# Patient Record
Sex: Male | Born: 1955 | Race: Black or African American | Hispanic: No | Smoking: Never smoker
Health system: Southern US, Community
[De-identification: ages and names within clinical notes are randomized; demographics above are authoritative.]

## PROBLEM LIST (undated history)

## (undated) DIAGNOSIS — K219 Gastro-esophageal reflux disease without esophagitis: Secondary | ICD-10-CM

## (undated) DIAGNOSIS — C801 Malignant (primary) neoplasm, unspecified: Secondary | ICD-10-CM

## (undated) DIAGNOSIS — I10 Essential (primary) hypertension: Secondary | ICD-10-CM

## (undated) HISTORY — PX: PROSTATE SURGERY: SHX751

---

## 1998-07-25 ENCOUNTER — Encounter: Payer: Self-pay | Admitting: *Deleted

## 1998-07-25 ENCOUNTER — Ambulatory Visit (HOSPITAL_COMMUNITY): Admission: RE | Admit: 1998-07-25 | Discharge: 1998-07-25 | Payer: Self-pay

## 1999-07-23 ENCOUNTER — Emergency Department (HOSPITAL_COMMUNITY): Admission: EM | Admit: 1999-07-23 | Discharge: 1999-07-23 | Payer: Self-pay | Admitting: Emergency Medicine

## 2000-06-28 ENCOUNTER — Emergency Department (HOSPITAL_COMMUNITY): Admission: EM | Admit: 2000-06-28 | Discharge: 2000-06-28 | Payer: Self-pay

## 2000-06-29 ENCOUNTER — Emergency Department (HOSPITAL_COMMUNITY): Admission: EM | Admit: 2000-06-29 | Discharge: 2000-06-29 | Payer: Self-pay | Admitting: Emergency Medicine

## 2001-03-18 ENCOUNTER — Emergency Department (HOSPITAL_COMMUNITY): Admission: EM | Admit: 2001-03-18 | Discharge: 2001-03-19 | Payer: Self-pay | Admitting: Emergency Medicine

## 2001-03-31 ENCOUNTER — Ambulatory Visit (HOSPITAL_COMMUNITY): Admission: RE | Admit: 2001-03-31 | Discharge: 2001-03-31 | Payer: Self-pay | Admitting: Gastroenterology

## 2001-05-01 ENCOUNTER — Encounter: Admission: RE | Admit: 2001-05-01 | Discharge: 2001-05-01 | Payer: Self-pay | Admitting: Internal Medicine

## 2001-05-01 ENCOUNTER — Encounter: Payer: Self-pay | Admitting: Internal Medicine

## 2001-05-15 ENCOUNTER — Ambulatory Visit (HOSPITAL_COMMUNITY): Admission: RE | Admit: 2001-05-15 | Discharge: 2001-05-15 | Payer: Self-pay | Admitting: Internal Medicine

## 2001-07-15 HISTORY — PX: ROTATOR CUFF REPAIR: SHX139

## 2001-09-18 ENCOUNTER — Encounter: Payer: Self-pay | Admitting: Orthopedic Surgery

## 2001-09-23 ENCOUNTER — Inpatient Hospital Stay (HOSPITAL_COMMUNITY): Admission: RE | Admit: 2001-09-23 | Discharge: 2001-09-24 | Payer: Self-pay | Admitting: Orthopedic Surgery

## 2001-09-23 ENCOUNTER — Encounter: Payer: Self-pay | Admitting: Orthopedic Surgery

## 2001-11-10 ENCOUNTER — Encounter: Payer: Self-pay | Admitting: Orthopaedic Surgery

## 2001-11-10 ENCOUNTER — Encounter: Admission: RE | Admit: 2001-11-10 | Discharge: 2001-11-10 | Payer: Self-pay | Admitting: Orthopaedic Surgery

## 2001-11-12 HISTORY — PX: NECK SURGERY: SHX720

## 2001-12-03 ENCOUNTER — Inpatient Hospital Stay (HOSPITAL_COMMUNITY): Admission: RE | Admit: 2001-12-03 | Discharge: 2001-12-05 | Payer: Self-pay | Admitting: Orthopaedic Surgery

## 2001-12-03 ENCOUNTER — Encounter: Payer: Self-pay | Admitting: Orthopaedic Surgery

## 2002-10-16 ENCOUNTER — Ambulatory Visit (HOSPITAL_COMMUNITY): Admission: RE | Admit: 2002-10-16 | Discharge: 2002-10-16 | Payer: Self-pay | Admitting: Internal Medicine

## 2002-10-16 ENCOUNTER — Encounter: Payer: Self-pay | Admitting: Internal Medicine

## 2004-07-19 ENCOUNTER — Ambulatory Visit: Payer: Self-pay | Admitting: Internal Medicine

## 2004-11-08 ENCOUNTER — Ambulatory Visit: Payer: Self-pay | Admitting: Internal Medicine

## 2005-04-15 ENCOUNTER — Inpatient Hospital Stay (HOSPITAL_COMMUNITY): Admission: EM | Admit: 2005-04-15 | Discharge: 2005-04-17 | Payer: Self-pay | Admitting: Emergency Medicine

## 2005-04-15 ENCOUNTER — Ambulatory Visit: Payer: Self-pay | Admitting: Internal Medicine

## 2005-04-16 ENCOUNTER — Encounter (INDEPENDENT_AMBULATORY_CARE_PROVIDER_SITE_OTHER): Payer: Self-pay | Admitting: Cardiology

## 2005-04-25 ENCOUNTER — Ambulatory Visit: Payer: Self-pay | Admitting: Internal Medicine

## 2005-09-19 ENCOUNTER — Ambulatory Visit: Payer: Self-pay | Admitting: Internal Medicine

## 2006-02-06 ENCOUNTER — Ambulatory Visit: Payer: Self-pay | Admitting: Hospitalist

## 2006-03-11 ENCOUNTER — Ambulatory Visit: Payer: Self-pay | Admitting: Hospitalist

## 2006-03-29 ENCOUNTER — Emergency Department (HOSPITAL_COMMUNITY): Admission: EM | Admit: 2006-03-29 | Discharge: 2006-03-29 | Payer: Self-pay | Admitting: Emergency Medicine

## 2006-07-01 DIAGNOSIS — Z8719 Personal history of other diseases of the digestive system: Secondary | ICD-10-CM | POA: Insufficient documentation

## 2006-07-01 DIAGNOSIS — E785 Hyperlipidemia, unspecified: Secondary | ICD-10-CM | POA: Insufficient documentation

## 2006-07-01 DIAGNOSIS — B350 Tinea barbae and tinea capitis: Secondary | ICD-10-CM | POA: Insufficient documentation

## 2006-07-01 DIAGNOSIS — M5030 Other cervical disc degeneration, unspecified cervical region: Secondary | ICD-10-CM | POA: Insufficient documentation

## 2006-07-01 DIAGNOSIS — N410 Acute prostatitis: Secondary | ICD-10-CM

## 2006-07-01 DIAGNOSIS — I10 Essential (primary) hypertension: Secondary | ICD-10-CM

## 2006-07-01 DIAGNOSIS — K219 Gastro-esophageal reflux disease without esophagitis: Secondary | ICD-10-CM | POA: Insufficient documentation

## 2006-10-29 ENCOUNTER — Emergency Department (HOSPITAL_COMMUNITY): Admission: EM | Admit: 2006-10-29 | Discharge: 2006-10-29 | Payer: Self-pay | Admitting: Family Medicine

## 2007-11-12 ENCOUNTER — Emergency Department (HOSPITAL_COMMUNITY): Admission: EM | Admit: 2007-11-12 | Discharge: 2007-11-12 | Payer: Self-pay | Admitting: Family Medicine

## 2008-06-16 ENCOUNTER — Emergency Department (HOSPITAL_COMMUNITY): Admission: EM | Admit: 2008-06-16 | Discharge: 2008-06-16 | Payer: Self-pay | Admitting: *Deleted

## 2009-02-19 ENCOUNTER — Emergency Department (HOSPITAL_COMMUNITY): Admission: EM | Admit: 2009-02-19 | Discharge: 2009-02-19 | Payer: Self-pay | Admitting: Emergency Medicine

## 2009-04-02 ENCOUNTER — Emergency Department (HOSPITAL_COMMUNITY): Admission: EM | Admit: 2009-04-02 | Discharge: 2009-04-02 | Payer: Self-pay | Admitting: Emergency Medicine

## 2009-12-25 ENCOUNTER — Emergency Department (HOSPITAL_COMMUNITY): Admission: EM | Admit: 2009-12-25 | Discharge: 2009-12-25 | Payer: Self-pay | Admitting: Emergency Medicine

## 2009-12-25 ENCOUNTER — Inpatient Hospital Stay (HOSPITAL_COMMUNITY): Admission: EM | Admit: 2009-12-25 | Discharge: 2009-12-27 | Payer: Self-pay | Admitting: Emergency Medicine

## 2009-12-27 ENCOUNTER — Encounter (INDEPENDENT_AMBULATORY_CARE_PROVIDER_SITE_OTHER): Payer: Self-pay | Admitting: Cardiovascular Disease

## 2010-08-04 ENCOUNTER — Encounter: Payer: Self-pay | Admitting: Gastroenterology

## 2010-09-30 LAB — CBC
HCT: 46.4 % (ref 39.0–52.0)
Hemoglobin: 16.2 g/dL (ref 13.0–17.0)
MCHC: 34.9 g/dL (ref 30.0–36.0)
MCV: 98.2 fL (ref 78.0–100.0)
Platelets: 150 10*3/uL (ref 150–400)
RBC: 4.73 MIL/uL (ref 4.22–5.81)
RDW: 12.8 % (ref 11.5–15.5)
WBC: 3.1 10*3/uL — ABNORMAL LOW (ref 4.0–10.5)

## 2010-09-30 LAB — BASIC METABOLIC PANEL
BUN: 8 mg/dL (ref 6–23)
CO2: 26 mEq/L (ref 19–32)
Calcium: 8.9 mg/dL (ref 8.4–10.5)
Chloride: 109 mEq/L (ref 96–112)
Creatinine, Ser: 0.99 mg/dL (ref 0.4–1.5)
GFR calc Af Amer: >60 mL/min (ref >60)
GFR calc non Af Amer: >60 mL/min (ref >60)
Glucose, Bld: 100 mg/dL — ABNORMAL HIGH (ref 70–99)
Potassium: 3.9 mEq/L (ref 3.5–5.1)
Sodium: 139 mEq/L (ref 135–145)

## 2010-09-30 LAB — HEPARIN LEVEL (UNFRACTIONATED): Heparin Unfractionated: <0.1 IU/mL — ABNORMAL LOW (ref 0.30–0.70)

## 2010-10-01 LAB — CBC
HCT: 44.1 % (ref 39.0–52.0)
HCT: 44.2 % (ref 39.0–52.0)
HCT: 49.3 % (ref 39.0–52.0)
Hemoglobin: 15.2 g/dL (ref 13.0–17.0)
Hemoglobin: 15.3 g/dL (ref 13.0–17.0)
Hemoglobin: 17.1 g/dL — ABNORMAL HIGH (ref 13.0–17.0)
MCHC: 34.7 g/dL (ref 30.0–36.0)
MCHC: 34.8 g/dL (ref 30.0–36.0)
MCV: 98.4 fL (ref 78.0–100.0)
MCV: 98.6 fL (ref 78.0–100.0)
Platelets: 153 10*3/uL (ref 150–400)
Platelets: 157 10*3/uL (ref 150–400)
RBC: 4.47 MIL/uL (ref 4.22–5.81)
RBC: 4.49 MIL/uL (ref 4.22–5.81)
RBC: 5 MIL/uL (ref 4.22–5.81)
RDW: 13.1 % (ref 11.5–15.5)
RDW: 13.3 % (ref 11.5–15.5)
WBC: 2.9 10*3/uL — ABNORMAL LOW (ref 4.0–10.5)
WBC: 3.4 10*3/uL — ABNORMAL LOW (ref 4.0–10.5)

## 2010-10-01 LAB — CK TOTAL AND CKMB (NOT AT ARMC)
CK, MB: 1.6 ng/mL (ref 0.3–4.0)
Relative Index: 1.3 (ref 0.0–2.5)
Total CK: 121 U/L (ref 7–232)

## 2010-10-01 LAB — HEPATIC FUNCTION PANEL
ALT: 16 U/L (ref 0–53)
AST: 28 U/L (ref 0–37)
Albumin: 3.6 g/dL (ref 3.5–5.2)
Alkaline Phosphatase: 53 U/L (ref 39–117)
Bilirubin, Direct: 0.2 mg/dL (ref 0.0–0.3)
Indirect Bilirubin: 0.6 mg/dL (ref 0.3–0.9)
Total Bilirubin: 0.8 mg/dL (ref 0.3–1.2)
Total Protein: 6.5 g/dL (ref 6.0–8.3)

## 2010-10-01 LAB — POCT CARDIAC MARKERS
CKMB, poc: <1 ng/mL — ABNORMAL LOW (ref 1.0–8.0)
Myoglobin, poc: 98.2 ng/mL (ref 12–200)
Troponin i, poc: <0.05 ng/mL (ref 0.00–0.09)

## 2010-10-01 LAB — LIPID PANEL
Cholesterol: 169 mg/dL (ref 0–200)
HDL: 53 mg/dL (ref >39)
LDL Cholesterol: 106 mg/dL — ABNORMAL HIGH (ref 0–99)
Total CHOL/HDL Ratio: 3.2 RATIO

## 2010-10-01 LAB — BASIC METABOLIC PANEL
BUN: 10 mg/dL (ref 6–23)
CO2: 29 mEq/L (ref 19–32)
Calcium: 9.2 mg/dL (ref 8.4–10.5)
Chloride: 110 mEq/L (ref 96–112)
Creatinine, Ser: 1.03 mg/dL (ref 0.4–1.5)
GFR calc Af Amer: >60 mL/min (ref >60)
GFR calc non Af Amer: >60 mL/min (ref >60)
Glucose, Bld: 94 mg/dL (ref 70–99)
Potassium: 4 mEq/L (ref 3.5–5.1)
Sodium: 144 mEq/L (ref 135–145)

## 2010-10-01 LAB — DIFFERENTIAL
Basophils Absolute: 0 10*3/uL (ref 0.0–0.1)
Basophils Relative: 1 % (ref 0–1)
Eosinophils Absolute: 0 10*3/uL (ref 0.0–0.7)
Eosinophils Relative: 1 % (ref 0–5)
Lymphocytes Relative: 31 % (ref 12–46)
Lymphs Abs: 0.9 10*3/uL (ref 0.7–4.0)
Monocytes Absolute: 0.3 10*3/uL (ref 0.1–1.0)
Monocytes Relative: 12 % (ref 3–12)
Neutro Abs: 1.6 10*3/uL — ABNORMAL LOW (ref 1.7–7.7)
Neutrophils Relative %: 56 % (ref 43–77)

## 2010-10-01 LAB — TROPONIN I: Troponin I: 0.01 ng/mL (ref 0.00–0.06)

## 2010-10-01 LAB — HEPARIN LEVEL (UNFRACTIONATED): Heparin Unfractionated: 0.64 IU/mL (ref 0.30–0.70)

## 2010-10-01 LAB — PROTIME-INR
INR: 1.04 (ref 0.00–1.49)
Prothrombin Time: 13.5 seconds (ref 11.6–15.2)

## 2010-10-01 LAB — CARDIAC PANEL(CRET KIN+CKTOT+MB+TROPI)
CK, MB: 1.3 ng/mL (ref 0.3–4.0)
Relative Index: 1.2 (ref 0.0–2.5)
Total CK: 105 U/L (ref 7–232)
Troponin I: <0.01 ng/mL (ref 0.00–0.06)

## 2010-10-01 LAB — MRSA PCR SCREENING: MRSA by PCR: NEGATIVE

## 2010-10-01 LAB — APTT: aPTT: 156 seconds — ABNORMAL HIGH (ref 24–37)

## 2010-10-01 LAB — TSH: TSH: 2.451 u[IU]/mL (ref 0.350–4.500)

## 2010-11-30 NOTE — Op Note (Signed)
[facility]  Patient:    Jeremy Hayden, Jeremy Hayden Visit Number: [id number] MRN: [id number]          Service Type: DSU Location: DAY Attending Physician:  Skip Mayer Dictated by:   Georges Lynch Darrelyn Hillock, M.D. Proc. Date: 09/22/01 Admit Date:  09/22/2001                             Operative Report  PREOPERATIVE DIAGNOSES:  1. Rotator cuff tendon tear with severe impingement syndrome of the right     shoulder.  2. Herniated cervical disk on the right at the C4-5 level.  POSTOPERATIVE DIAGNOSES:  1. Rotator cuff tendon tear with severe impingement syndrome of the right     shoulder.  2. Herniated cervical disk on the right at the C4-5 level.  OPERATION PERFORMED:  1. Partial acromionectomy and acromioplasty on the right.  2. Repair of a tear of the rotator cuff tendon on the right utilizing a     transverse suture, no multitack sutures were used.  DESCRIPTION OF PROCEDURE:  Under general anesthesia, the patient first had an interscalene block. After sterile prep and draping was carried out, an incision was made over the anterior aspect of the right shoulder. At this time, bleeders identified and cauterized. I then separated the deltoid tendon from the acromion in the usual fashion. I then went down and excised the subdeltoid bursa and noted that he had a severe impingement syndrome. Literally he had a hole in his rotator cuff tendon. I then protected the tendon with the Bennett retractor and utilized the oscillating saw and did a partial acromionectomy and then utilized the bur to even out the osteotomy site. I thoroughly irrigated out the area and then identified the hole in the tendon and repaired that with a #1 Ethibond suture. Following that, I then thoroughly irrigated out the area and reapproximated the deltoid tendon and muscle in the usual fashion. The subcu was closed with #0 Vicryl and the skin was closed in the usual fashion. A sterile  Neosporin dressing was applied. The patient then was placed in a shoulder immobilizer. Dictated by:   Georges Lynch Darrelyn Hillock, M.D. Attending Physician:  Skip Mayer DD:  09/22/01 TD:  09/23/01 Job: [id number]

## 2010-11-30 NOTE — Discharge Summary (Signed)
Jeremy Hayden, Jeremy Hayden               ACCOUNT NO.:  [id number]   MEDICAL RECORD NO.:  [id number]          PATIENT TYPE:  INP   LOCATION:  2003                         FACILITY:  [facility]   PHYSICIAN:  Duncan Dull, M.D.     DATE OF BIRTH:  Jul 14, 1956   DATE OF ADMISSION:  04/15/2005  DATE OF DISCHARGE:  04/17/2005                                 DISCHARGE SUMMARY   DISCHARGE DIAGNOSES:  1.  Noncardiac chest pain.  2.  Hypertension.  3.  Gastroesophageal reflux disease.  4.  Dyslipidemia.  5.  Scarring alopecia.  6.  History of C2-C3, C3-C4 disk herniation.  7.  History of rotator cuff repair.  8.  History of irregular heartbeat.  9.  Elevated transaminases.   DISCHARGE MEDICATIONS:  1.  Aspirin 81 mg one tab p.o. daily.  2.  Hydrochlorothiazide 25 mg one tab p.o. daily.  3.  Protonix 40 mg one tab p.o. b.i.d.  4.  Metoprolol 50 mg one tab p.o. b.i.d.  5.  Lisinopril 20 mg one tablet p.o. daily.  6.  Gemfibrozil 600 mg one tab p.o. b.i.d.   CONDITION AT DISCHARGE:  Improved.   FOLLOWUP PLANS:  1.  The patient will follow up to Dr. Orma Flaming on October 12 at 2:30 to follow      up on his chest pain.  2.  The patient can follow up with Dr. Domingo Sep at [facility]      as needed for chest pain.  3.  At the time of hospital followup, we will check his potassium, as well      as his blood pressure.   PROCEDURES:  1.  A CT angiogram of the chest on April 15, 2005 shows a normal      examination.  No evidence of pulmonary emboli.  2.  A portable chest x-ray done on April 15, 2005 shows poor inspiration,      but no active disease.  3.  A 2-D echocardiogram done on April 16, 2005 shows normal left      ventricular systolic function with an ejection fraction of 55-65%.  No      regional wall motion abnormalities.  4.  A Cardiolite done on April 17, 2005 shows normal myocardial perfusion      and a normal left ventricular systolic function with no evidence of  regional wall motion abnormality.   CONSULTATIONS:  Dr. Domingo Sep of [facility].   BRIEF ADMITTING HISTORY AND PHYSICAL:  [name] is a 55 year old African-  American male with a past medical history of hypertension and dyslipidemia,  who presents with new onset of chest pain for the past seven days.  The  symptoms began approximately a week ago with complaints of chest pain  radiating to the chest which he attributed to walking too much.  He took  Maalox with no relief.  His symptoms persisted until yesterday when he awoke  with increasing chest pain and shortness of breath unrelieved with Advil and  Maalox.  The chest pain again worsened on the day of admission with shooting  high voltage chest  pain for which he sought medical treatment at the  outpatient clinic and was referred to the emergency room for further  evaluation by his primary care Shaili Donalson.  He additionally notes the presence  of PND, denies nausea or vomiting, edema, or syncope.  He was seen several  years ago by [facility] for an irregular heartbeat and was told to  take aspirin.  He frequently takes Advil for pain.  In the ED, his symptoms  were unrelieved by nitroglycerin drip.  He did receive aspirin, Lopressor,  Pepcid, and p.r.n. morphine and a chest CT to rule out a PE which was  negative.   PAST MEDICAL HISTORY:  1.  Hypertension.  2.  GERD.  3.  Dyslipidemia.  4.  Scarring alopecia, of note unknown etiology.  5.  History of irregular heartbeat.   HOME MEDICATIONS:  1.  Prilosec 20 mg p.o. daily.  2.  Hydrochlorothiazide 25 mg p.o. daily.  3.  Advil four tabs p.r.n. pain.  4.  Maalox p.r.n.   PHYSICAL EXAMINATION ON ADMISSION:  VITAL SIGNS:  Temperature 97.9, blood  pressure 136-157/96-104, pulse was 75-84, respirations 20, oxygen saturation  100% on 2 L.  GENERAL:  He is in no acute distress.  HEENT:  Was only significant for poor dentition, otherwise normal.  NECK:  Supple.  No  thyromegaly.  RESPIRATIONS:  Clear bilaterally.  CARDIOVASCULAR:  Showed a regular rate and rhythm with no murmurs, rubs, or  gallops.  ABDOMEN:  Soft, nontender, nondistended, no masses, no hepatosplenomegaly.  Positive bowel sounds.  EXTREMITIES:  Showed no clubbing, cyanosis, or edema.  NEUROLOGIC:  Cranial nerves were intact.  There were no gross deficits.  Strength was 4+/5 bilaterally in his right upper extremity; otherwise  normal.  Sensation was intact.   ADMISSION LABORATORIES:  White blood cell count of 5.3, hemoglobin 16.5,  hematocrit 47.3, platelets 216,000 with a MCV of 99.4.  Sodium 141,  potassium 4.2, chloride 108, bicarb 24, BUN 10, creatinine 1.1, glucose 89.  Point-of-care markers were negative.  Total bilirubin 1.2, alk phos 81, AST  77, ALT 46, total protein 7, albumin 3.7, calcium 9.3.  Lipase was 62.   HOSPITAL COURSE:  Problem 1. Noncardiac chest pain.  The patient came in  with substernal chest pain with some typical and some atypical features.  He  was ruled out for acute MI with negative cardiac enzymes and no changes in  his EKG; however, he was placed on telemetry, started on aspirin and  metoprolol.  He did not experience any further episodes of chest pain while  in the hospital.  Given his history of irregular heartbeat, we got a TSH  which was normal.  Given his history of shortness of breath and reports of  PND, we obtained a 2-D echocardiogram which was normal.  We did consult  cardiology given his multiple cardiac risk factors and an inpatient  Cardiolite was performed.  This was completely normal.  The patient was  discharged home with no obvious cause of his chest pain, however, it was  felt that is possible it could be due to anxiety or his GERD (see problems  below).  The patient was discharged home on aspirin and metoprolol.   Problem 2. Hypertension.  Patient came in with a previous diagnosis of hypertension only taking hydrochlorothiazide.   His blood pressure was  controlled during his hospital with hydrochlorothiazide 25 mg, metoprolol 50  mg b.i.d., and lisinopril 20 mg daily per cardiology recommendations.  He  was discharged home on these medications and will follow up with the  outpatient clinic for a blood pressure check.   Problem 3. Gastroesophageal reflux disease.  The patient came in on Prilosec  with some GERD-like symptoms.  He was placed on Protonix during the course  of his hospitalization, Helicobacter pylori test was negative.  He was  discharged home on Protonix double dose of 40 mg b.i.d. and will follow up  on these symptoms at his hospital followup visit.   Problem 4. History of dyslipidemia.  The patient came in with a history of  dyslipidemia, however, was taking no lipid-lowering agent.  Fasting lipid  panel showed a total cholesterol of 159, LDL of 59, HDL 51, triglycerides of  784.  He was started on gemfibrozil and tolerated this medication well.  He  is discharged home on this medication and we will follow up his fasting  lipid panel in the next two to three months.   Problem 5. Hypokalemia.  The patient came in with a potassium of 3.4.  We  felt this was due to his hydrochlorothiazide.  It was repleted and at the  time of discharge it was 4.2.  We will follow up this at his hospital  followup.   Problem 6. Elevated transaminases.  The patient came in with the finding of  elevated transaminases, however, he had no abdominal complaints and a  negative abdominal exam.  On admission, his transaminases were followed and  trended down during the course of his hospitalization.  At the time of  discharge an AST was 78 and an ALT was 40.   Problem 7. Elevated lipase.  The patient came in with a lipase of 62,  however, he had no abdominal complaints and his abdominal exam was  completely normal.  We advanced his diet as tolerated, but continued to  follow his lipase which on the second day of  hospitalization was 113 and  amylase was 128.  He again complained of no abdominal pain and tolerated a  full diet at the time of discharge.   DISCHARGE LABORATORIES AND VITAL SIGNS:  On the day of discharge, the  patient had a temperature of 97.2, pulse of 55, blood pressure 131/86,  respirations 20, 100% on 2 L nasal cannula.  He had a sodium of 139,  potassium 4.2, chloride 105, bicarb 25, BUN 10, creatinine 1.1, glucose 110,  calcium 9.5, AST 70, ALT 40, alk phos 64, total bilirubin 1.1.  There are no  pending labs at the time of discharge.      Inis Sizer, M.D.      Duncan Dull, M.D.  Electronically Signed    DC/MEDQ  D:  05/10/2005  T:  05/11/2005  Job:  [id number]   cc:   Dani Gobble, MD  Fax: [phone]

## 2010-11-30 NOTE — H&P (Signed)
[facility]  Patient:    Jeremy Hayden, MARONE Visit Number: [id number] MRN: [id number]          Service Type: Attending:  Georges Lynch. Darrelyn Hillock, M.D. Dictated by:   Dorie Rank, P.A. Adm. Date:  09/22/01   CC:         Jerl Santos, M.D.   History and Physical  DATE OF BIRTH:  1955/09/11.  CHIEF COMPLAINT:  Right shoulder pain.  HISTORY OF PRESENT ILLNESS:  Mr. Craddock is a pleasant 55 year old male who has had a progressive history of right shoulder pain that has been increasing in intensity and interfering with his activities of daily living.  It is at a point now where it is interfering with his work and sleep.  He had an MRI, which revealed a rotator cuff tear.  It was felt that due to the interference with his ADLs as well as diagnostic studies, he would benefit from undergoing a rotator cuff repair.  Risks and benefits as well as the procedure were discussed with the patient, who elected to proceed.  ALLERGIES:  No known drug allergies.  MEDICATIONS: 1. Aspirin.  He has discontinued this in anticipation for his upcoming    surgery. 2. Nexium 40 mg one p.o. q.d. 3. Bextra 20 mg one p.o. q.d. 4. Vicodin p.r.n.  PAST MEDICAL HISTORY:  He has occasional headache, relieved with Tylenol.  No history of migraines.  He has a history of asthma; however, he has not had an exacerbation in about three years.  He utilizes albuterol when he does have attacks.  He has a history of gastroesophageal reflux disease.  Otherwise he is in relatively good health.  SOCIAL HISTORY:  The patient is married.  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  He does not smoke. He drinks about one to two beers per week.  He has three children.  His wife will be available after the surgery to care for him.  He is right-hand dominant.  PAST SURGICAL HISTORY:  Tonsillectomy and appendectomy as a child.  FAMILY HISTORY:   Mother living, age 35, healthy.  Father living, age 50, history of prostate cancer.  REVIEW OF SYSTEMS:  GENERAL:  No fevers, chills, night sweats, or bleeding tendencies.  PULMONARY:  No shortness of breath, productive cough, or hemoptysis.  CARDIOVASCULAR:  No chest pain, angina, or orthopnea.  ENDOCRINE: No hypothyroidism or hyperthyroidism.  No diabetes mellitus. GASTROINTESTINAL:  No nausea, vomiting, diarrhea, constipation, or melena. GENITOURINARY:  No hematuria or dysuria or discharge.  NEUROLOGIC:  No seizures, headaches, or paralysis.  MUSCULOSKELETAL:  Right shoulder pain as discussed up above.  PHYSICAL EXAMINATION:  VITAL SIGNS:  Pulse 66, respirations 16, blood pressure 118/85.  GENERAL:  Alert and oriented, well-developed, well-nourished 55 year old male.  HEENT:  Head is atraumatic, normocephalic.  Oropharynx is clear.  NECK:  Supple.  Negative for carotid bruits bilaterally.  Negative for cervical lymphadenopathy palpated on exam.  CHEST:  Lungs clear to auscultation bilaterally.  No wheezes, rhonchi, or rales.  BREASTS:  Not pertinent to present illness.  CARDIAC:  S1, S2, negative for murmur, rub, or gallop.  Heart is regular rate and rhythm.  ABDOMEN:  Soft, nontender, positive bowel sounds.  GENITOURINARY:  Not pertinent to present illness.  EXTREMITIES:  Decreased range of motion and pain to the right shoulder.  He has limited abduction.  He has pain with flexion and extension of his shoulders.  No masses or tumors palpated on exam about the shoulder, axillary, or scapular region.  Sensation is intact to the bilateral upper extremities. Radial pulses are 2+ and symmetrical.  SKIN:  Intact with no rashes or lesions appreciated on exam.  DIAGNOSTIC STUDIES:  Preop labs and x-rays are pending.  IMPRESSION: 1. Right rotator cuff tear. 2. History of asthma. 3. Gastroesophageal reflux disease.  PLAN:  The patient will be scheduled for a right rotator  cuff repair by Dr. Ranee Gosselin. Dictated by:   Dorie Rank, P.A. Attending:  Georges Lynch. Darrelyn Hillock, M.D. DD:  09/16/01 TD:  09/16/01 Job: [id number]

## 2010-11-30 NOTE — H&P (Signed)
[facility]  Patient:    Jeremy Hayden, Jeremy Hayden Visit Number: [id number] MRN: [id number]          Service Type: SUR Location: 4W [id number] [id number] Attending Physician:  Patricia Nettle Dictated by:   Sammuel Cooper. Mahar, P.A. Admit Date:  12/03/2001                           History and Physical  DATE OF BIRTH:  04-28-1956  CHIEF COMPLAINT:  Neck and right upper extremity pain.  HISTORY OF PRESENT ILLNESS:  The patient is a 55 year old male with neck and shoulder, arm and hand pain and tingling.  This began approximately six months ago while he was at work as he was moving some sheetrock at which time he had immediate onset of neck and shoulder pain.  Eventually he got an MRI scan which showed a rotator cuff tear, and Dr. Darrelyn Hillock did an open repair with acromioplasty in March 2003.  He is currently describing pain and numbness radiating into his shoulder, down his right arm, and to all five digits ______  management.  Risks and benefits of proposed surgery were discussed with the patient by Dr. Sharolyn Douglas.  He indicated understanding and wished to proceed.  ALLERGIES:  No known drug allergies.  MEDICATIONS:  Nexium.  PAST MEDICAL HISTORY: 1. Gastroesophageal reflux disease. 2. History of asthma.  He uses albuterol approximately two times per month.  PAST SURGICAL HISTORY:  Right rotator cuff in March 2003.  SOCIAL HISTORY:  Denies tobacco use.  Positive alcohol but not since his surgery in March.  He is married, has three grown children.  He works as a Estate agent.  FAMILY HISTORY:  Noncontributory.  REVIEW OF SYSTEMS:  The patient denies any fevers, chills, night sweats, or bleeding tendencies.  CNS:  Denies any blurred vision, double vision, headaches, seizure, or paralysis.  CARDIOVASCULAR:  Denies any chest pain, angina, orthopnea, claudication, or palpitations.  PULMONARY:  Denies any shortness of breath, productive cough, or hemoptysis.   GASTROINTESTINAL: Denies any nausea, vomiting, constipation, diarrhea, melena, or bloody stool. GENITOURINARY:  Denies any dysuria, hematuria, or discharge.  MUSCULOSKELETAL: As per HPI.  PHYSICAL EXAMINATION:  VITAL SIGNS:  Blood pressure 136/96, respirations 16 and unlabored, pulse 72 and regular.  GENERAL:  The patient is a 55 year old black male who is alert and oriented, in no acute distress.  He is well-nourished, well-groomed.  Appears his stated age.  He appears uncomfortable in the examination room.  HEENT:  Head normocephalic, atraumatic.  Pupils equal, round, and reactive to light.  Extraocular movements intact.  Nares patent.  Pharynx is clear.  NECK:  Soft to palpation.  No bruits appreciated.  No lymphadenopathy noted. No thyromegaly noted.  CHEST:  Clear.  No rales, rhonchi, stridor, wheezes, or friction rubs appreciated.  BREASTS:  Not pertinent, not performed.  HEART:  S1, S2.  Regular rate and rhythm.  No murmurs, gallops, or rubs noted.   ABDOMEN:  Soft to palpation.  Nontender, nondistended.  No organomegaly noted.  GENITOURINARY:  Not pertinent, not performed.  EXTREMITIES:  Positive radial pulses, equal and strong bilaterally.  Sensation is intact to light touch.  Motor function is intact.  On examination today as per Dr. Patria Mane note there is 3/5 strength in external rotation of the right shoulder.  Grip strength is 4-/5 on the right.  Wrist extensors are 4/5 on the right.  Otherwise, strength is 5/5.  There is decreased sensation on the right in the C6-7 distribution, mildly positive carpal tunnel compression as well as Tinels over the median nerve at the wrist.  Reflexes are 1+ and symmetric in the upper extremities, 2+ and symmetric in the lower extremities.  Babinski, clonus, and Hoffmann are negative.  SKIN:  Intact.  Without any lesions or rashes.  LABORATORY DATA:  CT myelogram and MRI show multilevel spondylitic changes with disk protrusion,  foraminal narrowing secondary to uncovertebral hypertrophy most significant at C4-5 and 5-6.  There is some mild narrowing at C3-4 of the foramen.  IMPRESSION: 1. Degenerative disk disease with cervical radiculopathy. 2. Gastroesophageal reflux. 3. Asthma, which is very mild.  PLAN: 1. Admit to [facility] on Dec 03, 2001, for anterior cervical    diskectomy and fusion at C4-5 and 5-6 utilizing left iliac crest bone    graft.  This will be done by Dr. Sharolyn Douglas. 2. The patients primary care physician is Dr. Lane Hacker. Dictated by:   Sammuel Cooper. Mahar, P.A. Attending Physician:  Patricia Nettle. DD:  12/02/01 TD:  12/03/01 Job: [id number]

## 2010-11-30 NOTE — Op Note (Signed)
[facility]  Patient:    Jeremy Hayden, Jeremy Hayden Visit Number: [id number] MRN: [id number]          Service Type: SUR Location: [id number] Attending Physician:  Patricia Nettle Dictated by:   Patricia Nettle, M.D. Proc. Date: 12/03/01 Admit Date:  12/03/2001 Discharge Date: 12/05/2001                             Operative Report  DATE OF BIRTH:  October 22, 1955  PREOPERATIVE DIAGNOSES: 1. Cervical spondylotic radiculopathy, C4-5 and C5-6. 2. Degenerative disk disease.  POSTOPERATIVE DIAGNOSES: 1. Cervical spondylotic radiculopathy, C4-5 and C5-6. 2. Degenerative disk disease.  PROCEDURE: 1. Anterior cervical diskectomy with decompression of the spinal cord and    nerve roots at C4-5 and C5-6. 2. Anterior cervical arthrodesis of C4-5 and C5-6. 3. Placement of intervertebral Synthes allograft prosthesis spacer, 8 mm at    C4-5 and C5-6. 4. Anterior cervical instrumentation with the Synthes small stature anterior    cervical locking plate. 5. Left anterior iliac crest bone graft. 6. Placement and removal of Gardner-Wells tongs.  SURGEON:  Patricia Nettle, M.D.  ASSISTANT:  Colleen P. Mahar, P.A.  ANESTHESIA:  General endotracheal.  COMPLICATIONS:  None.  INDICATIONS AND FINDINGS:  The patient is a 55 year old male with a history of progressively worsening neck, right shoulder, and upper extremity pain.  He is status post a right rotator cuff repair with incomplete relief of his symptoms.  His examination is consistent with a C5 and C6 radiculopathy.  EMG showed findings consistent with a C5-6 radiculopathy as well as mild right carpal tunnel syndrome.  His MRI scan and CT myelogram showed foraminal stenosis at C4-5 and C5-6 on the right consistent with his symptomatology. Failing an appropriate course of conservative treatment, he elected to undergo anterior cervical diskectomy and fusion in hopes of improving his symptomatology.  At surgery, the  foramen was found to be tight at both the C4-5 and C5-6 levels secondary to bony overgrowth of the uncovertebral joint. This was taken down with a high speed bur and the right C5 and C6 nerve roots were completely decompressed.  The bony quality was good.  An 8 mm Synthes lordotic prosthesis spacers were placed at both levels.  A 37 mm Synthes small stature anterior cervical locking plate was placed.  There was excellent purchase of the screws.  DESCRIPTION OF PROCEDURE:  The patient was properly identified in the holding area as [name] and taken to the operating room.  He underwent general endotracheal anesthesia without difficulty.  Care was taken not to hyperextend his neck.  Neuro monitoring was established for SFEPs.  At this point, the patient was positioned with hyperextension of his neck.  The Gardner-Wells tongs were placed under sterile conditions in the standard fashion.  Five pounds of weight were hung from his head.  The neck and left anterior iliac crest was prepped and draped in the usual sterile fashion.  One percent lidocaine with epinephrine was injected at the planned surgical incisions.  A 4 cm incision was made transversely in the midline and extending to the anterior border of the sternocleidomastoid muscle.  This was placed just inferior to the thyroid cartilage.  Dissection was carried down to the platysmal muscle.  The platysmal muscle was incised sharply with the knife. We encountered a large branch of the jugular vein and this was dissected free and tied off without  difficulty.  We then developed a plane between the sternocleidomastoid and the strap muscles medially.  We identified the esophagus and the carotid sheath, protecting these structures at all times.  After exposing the anterior cervical spine, we placed a spinal needle, and intraoperative x-ray showed that we were at the C6-7 level.  We then placed the needle at the C4-5 level and took a repeat  x-ray, confirming our levels. We then used the Bovie to elevate to elevate the longus colli muscle bilaterally.  Great care was taken to protect the C3-4 disk as well as the C6-7 disk below and peripherally placed our deep retractors.  A subtotal diskectomy was performed at C4-5 and C5-6.  We then draped the surgical microscope and it was brought into the field.  Using curets and micro Kerrisons, we exposed the posterior longitudinal ligament at both C4-5 and C5-6, as well as the uncovertebral joints.  We used the high speed bur to remove the uncus and then used micro Kerrisons and curets to decompress the foramen.  We identified the right C5 and C6 nerve roots and used the blunt probe to confirm that they were patent all the way out the foramen.  We then prepared our endplates with the high speed bur.  The Caspar distractions pins were placed.  Gentle distraction was applied.  We used trial spacers and identified the 8 mm graft as the appropriate size at both levels.  We then turned our attention to the left anterior iliac crest.  A 1 cm incision was made 2 cm posterior to the anterior-superior iliac spine. Dissection was carried down to deep fascia.  The deep fascia was incised and we exposed the anterior iliac crest.  Using a saucer 10 mm bone harvester, we collected 2 cc of cancellous autograft from the crest.  We then copiously irrigated the wound and used a #1 suture to close the deep layer followed by a 2-0 interrupted Vicryl on the skin and then a running 4-0.  Benzoin and Steri-Strips were placed.  We then took the 8 mm Synthes allograft prosthesis spacers and packed these full of the autograft bone that we had just collected.  The grafts were then carefully tapped into position.  The Caspar distraction pins were removed and we had excellent interference fit at this point.  We then prepared the anterior spine with the high speed bur to accept the plate, removing all bony ridges.   We then placed a 37 mm Synthes small statured plate in the standard fashion, placed six 12 mm screws followed by the interlocking screws.  Intraoperative x-rays showed excellent positioning of the bone grafts as well as the plate.  The wound was copiously irrigated.  We inspected the carotid sheath, the esophagus, and trachea, and there were no apparent injuries.  All bleeding was controlled with Gelfoam and bipolar electrocautery.  At this point, we placed a deep Penrose drain.  We closed the platysma with a 2-0 interrupted Vicryl.  The subcutaneous layer was closed with a 3-0 interrupted Vicryl followed by a running 4-0 subcuticular stitch.  Benzoin and Steri-Strips were placed.  There were no deleterious changes in the SFEP throughout the procedure.  A sterile dressing was applied.  The patient was extubated without difficulty, able to move his upper and lower extremities when he was transferred to the recovery room in stable condition. Dictated by:   Patricia Nettle, M.D. Attending Physician:  Patricia Nettle. DD:  12/03/01 TD:  12/06/01 Job: [phone]  [id number]

## 2010-11-30 NOTE — Procedures (Signed)
[place]  Patient:    Jeremy Hayden, Jeremy Hayden Visit Number: [id number] MRN: [id number]          Service Type: Attending:  Verlin Grills, M.D. Dictated by:   Verlin Grills, M.D. Proc. Date: 03/31/01   CC:         Jerl Santos, MD   Procedure Report  DATE OF BIRTH:  12/14/1955  PROCEDURE PERFORMED:  Esophagogastroduodenoscopy.  ENDOSCOPIST:  Verlin Grills, M.D.  REFERRING PHYSICIAN:  Sherin Quarry, M.D.  INDICATIONS FOR PROCEDURE:  The patient is a 55 year old male with chronic gastroesophageal reflux disease and new epigastric pain despite taking Prilosec 20 mg daily.  PREMEDICATION:  Versed 10 mg, fentanyl 50 mcg.  ENDOSCOPE:  Olympus gastroscope.  DESCRIPTION OF PROCEDURE:  After obtaining informed consent, [name] was placed in the left lateral decubitus position.  I administered intravenous fentanyl and intravenous Versed to achieve conscious sedation for the procedure.  The patients blood pressure, oxygen saturations and cardiac rhythm were monitored throughout the procedure and documented in the medical record.  The Olympus gastroscope was passed through the posterior hypopharynx into the proximal esophagus without difficulty.  The hypopharynx, larynx and vocal cords appeared normal.  Esophagoscopy:  The proximal, mid and lower segments of the esophageal mucosa appear completely normal endoscopically.  There is no evidence for the presence of Barretts esophagus, esophageal mucosal scarring, erosive esophagitis or esophageal ulceration.  Gastroscopy:  There is a moderate hiatal hernia with normal overlying gastric mucosa.  Retroflex view of the gastric cardia and fundus was normal.  The diaphragmatic hiatus is quite patulous.  The gastric body, antrum and pylorus appear completely normal endoscopically.  Duodenoscopy:  The duodenal bulb, midduodenum, and distal duodenum  appear normal.  ASSESSMENT:  Normal esophagogastroduodenoscopy.  Moderate-sized hiatal hernia is present along with a patulous diaphragmatic hiatus. Dictated by:   Verlin Grills, M.D. Attending:  Verlin Grills, M.D. DD:  03/31/01 TD:  03/31/01 Job: [id number]

## 2011-04-09 LAB — POCT URINALYSIS DIP (DEVICE)
Bilirubin Urine: NEGATIVE
Glucose, UA: NEGATIVE
Ketones, ur: NEGATIVE
Specific Gravity, Urine: 1.02

## 2011-04-09 LAB — URINE CULTURE
Colony Count: NO GROWTH
Culture: NO GROWTH

## 2014-04-22 ENCOUNTER — Emergency Department (HOSPITAL_COMMUNITY)
Admission: EM | Admit: 2014-04-22 | Discharge: 2014-04-22 | Disposition: A | Discharge status: Home / Self Care | Payer: Self-pay | Attending: Emergency Medicine | Admitting: Emergency Medicine

## 2014-04-22 ENCOUNTER — Encounter (HOSPITAL_COMMUNITY): Payer: Self-pay | Admitting: Emergency Medicine

## 2014-04-22 ENCOUNTER — Emergency Department (HOSPITAL_COMMUNITY): Payer: Self-pay

## 2014-04-22 DIAGNOSIS — R519 Headache, unspecified: Secondary | ICD-10-CM

## 2014-04-22 DIAGNOSIS — R0602 Shortness of breath: Secondary | ICD-10-CM | POA: Insufficient documentation

## 2014-04-22 DIAGNOSIS — Z79899 Other long term (current) drug therapy: Secondary | ICD-10-CM | POA: Insufficient documentation

## 2014-04-22 DIAGNOSIS — Z8546 Personal history of malignant neoplasm of prostate: Secondary | ICD-10-CM | POA: Insufficient documentation

## 2014-04-22 DIAGNOSIS — K625 Hemorrhage of anus and rectum: Secondary | ICD-10-CM | POA: Insufficient documentation

## 2014-04-22 DIAGNOSIS — R51 Headache: Secondary | ICD-10-CM | POA: Insufficient documentation

## 2014-04-22 DIAGNOSIS — I10 Essential (primary) hypertension: Secondary | ICD-10-CM | POA: Insufficient documentation

## 2014-04-22 HISTORY — DX: Malignant (primary) neoplasm, unspecified: C80.1

## 2014-04-22 HISTORY — DX: Essential (primary) hypertension: I10

## 2014-04-22 LAB — COMPREHENSIVE METABOLIC PANEL
ALBUMIN: 4 g/dL (ref 3.5–5.2)
ALK PHOS: 68 U/L (ref 39–117)
ALT: 64 U/L — ABNORMAL HIGH (ref 0–53)
ANION GAP: 18 — AB (ref 5–15)
AST: 197 U/L — AB (ref 0–37)
BILIRUBIN TOTAL: 1 mg/dL (ref 0.3–1.2)
BUN: 9 mg/dL (ref 6–23)
CHLORIDE: 100 meq/L (ref 96–112)
CO2: 21 meq/L (ref 19–32)
CREATININE: 0.97 mg/dL (ref 0.50–1.35)
Calcium: 9.4 mg/dL (ref 8.4–10.5)
GFR calc Af Amer: >90 mL/min (ref >90)
GFR, EST NON AFRICAN AMERICAN: 89 mL/min — AB (ref >90)
Glucose, Bld: 105 mg/dL — ABNORMAL HIGH (ref 70–99)
POTASSIUM: 4.2 meq/L (ref 3.7–5.3)
Sodium: 139 mEq/L (ref 137–147)
Total Protein: 8 g/dL (ref 6.0–8.3)

## 2014-04-22 LAB — PROTIME-INR
INR: 1.1 (ref 0.00–1.49)
Prothrombin Time: 14.2 seconds (ref 11.6–15.2)

## 2014-04-22 LAB — I-STAT CG4 LACTIC ACID, ED: LACTIC ACID, VENOUS: 1.58 mmol/L (ref 0.5–2.2)

## 2014-04-22 LAB — ABO/RH: ABO/RH(D): AB POS

## 2014-04-22 LAB — TYPE AND SCREEN
ABO/RH(D): AB POS
Antibody Screen: NEGATIVE

## 2014-04-22 LAB — CBC
HEMATOCRIT: 47.3 % (ref 39.0–52.0)
Hemoglobin: 17 g/dL (ref 13.0–17.0)
MCH: 35.8 pg — ABNORMAL HIGH (ref 26.0–34.0)
MCHC: 35.9 g/dL (ref 30.0–36.0)
MCV: 99.6 fL (ref 78.0–100.0)
Platelets: 167 10*3/uL (ref 150–400)
RBC: 4.75 MIL/uL (ref 4.22–5.81)
RDW: 11.7 % (ref 11.5–15.5)
WBC: 3.7 10*3/uL — AB (ref 4.0–10.5)

## 2014-04-22 LAB — POC OCCULT BLOOD, ED: FECAL OCCULT BLD: NEGATIVE

## 2014-04-22 MED ORDER — OXYCODONE-ACETAMINOPHEN 5-325 MG PO TABS
1.0000 | ORAL_TABLET | Freq: Once | ORAL | Status: AC
  Administered 2014-04-22: 1 via ORAL
  Filled 2014-04-22: qty 1

## 2014-04-22 MED ORDER — BUTALBITAL-APAP-CAFFEINE 50-325-40 MG PO TABS: 1.0000 | ORAL_TABLET | Freq: Four times a day (QID) | ORAL | Status: DC | PRN

## 2014-04-22 NOTE — Discharge Instructions (Signed)
Push fluids: take small frequent sips of water or Gatorade, do not drink any soda, juice or caffeinated beverages.    Do not hesitate to return to the emergency room for any new, worsening or concerning symptoms.  Please obtain primary care using resource guide below. But the minute you were seen in the emergency room and that they will need to obtain records for further outpatient management.    Bloody Stools Bloody stools means there is blood in your poop (stool). It is a sign that there is a problem somewhere in the digestive system. It is important for your doctor to find the cause of your bleeding, so the problem can be treated.  HOME CARE  Only take medicine as told by your doctor.  Eat foods with fiber (prunes, bran cereals).  Drink enough fluids to keep your pee (urine) clear or pale yellow.  Sit in warm water (sitz bath) for 10 to 15 minutes as told by your doctor.  Know how to take your medicines (enemas, suppositories) if advised by your doctor.  Watch for signs that you are getting better or getting worse. GET HELP RIGHT AWAY IF:   You are not getting better.  You start to get better but then get worse again.  You have new problems.  You have severe bleeding from the place where poop comes out (rectum) that does not stop.  You throw up (vomit) blood.  You feel weak or pass out (faint).  You have a fever. MAKE SURE YOU:   Understand these instructions.  Will watch your condition.  Will get help right away if you are not doing well or get worse. Document Released: 06/19/2009 Document Revised: 09/23/2011 Document Reviewed: 11/16/2010 [facility] Patient Information 2015 [place], [place]. This information is not intended to replace advice given to you by your health care provider. Make sure you discuss any questions you have with your health care provider.  General Headache Without Cause A general headache is pain or discomfort felt around the head or neck area.  The cause may not be found.  HOME CARE   Keep all doctor visits.  Only take medicines as told by your doctor.  Lie down in a dark, quiet room when you have a headache.  Keep a journal to find out if certain things bring on headaches. For example, write down:  What you eat and drink.  How much sleep you get.  Any change to your diet or medicines.  Relax by getting a massage or doing other relaxing activities.  Put ice or heat packs on the head and neck area as told by your doctor.  Lessen stress.  Sit up straight. Do not tighten (tense) your muscles.  Quit smoking if you smoke.  Lessen how much alcohol you drink.  Lessen how much caffeine you drink, or stop drinking caffeine.  Eat and sleep on a regular schedule.  Get 7 to 9 hours of sleep, or as told by your doctor.  Keep lights dim if bright lights bother you or make your headaches worse. GET HELP RIGHT AWAY IF:   Your headache becomes really bad.  You have a fever.  You have a stiff neck.  You have trouble seeing.  Your muscles are weak, or you lose muscle control.  You lose your balance or have trouble walking.  You feel like you will pass out (faint), or you pass out.  You have really bad symptoms that are different than your first symptoms.  You have  problems with the medicines given to you by your doctor.  Your medicines do not work.  Your headache feels different than the other headaches.  You feel sick to your stomach (nauseous) or throw up (vomit). MAKE SURE YOU:   Understand these instructions.  Will watch your condition.  Will get help right away if you are not doing well or get worse. Document Released: 04/09/2008 Document Revised: 09/23/2011 Document Reviewed: 06/21/2011 [facility] Patient Information 2015 [place], [place]. This information is not intended to replace advice given to you by your health care provider. Make sure you discuss any questions you have with your health care  provider.   Emergency Department Resource Guide 1) Find a Doctor and Pay Out of Pocket Although you won't have to find out who is covered by your insurance plan, it is a good idea to ask around and get recommendations. You will then need to call the office and see if the doctor you have chosen will accept you as a new patient and what types of options they offer for patients who are self-pay. Some doctors offer discounts or will set up payment plans for their patients who do not have insurance, but you will need to ask so you aren't surprised when you get to your appointment.  2) Contact Your Local Health Department Not all health departments have doctors that can see patients for sick visits, but many do, so it is worth a call to see if yours does. If you don't know where your local health department is, you can check in your phone book. The CDC also has a tool to help you locate your state's health department, and many state websites also have listings of all of their local health departments.  3) Find a [facility] If your illness is not likely to be very severe or complicated, you may want to try a walk in clinic. These are popping up all over the country in pharmacies, drugstores, and shopping centers. They're usually staffed by nurse practitioners or physician assistants that have been trained to treat common illnesses and complaints. They're usually fairly quick and inexpensive. However, if you have serious medical issues or chronic medical problems, these are probably not your best option.  No Primary Care Doctor: - Call Health Connect at  [phone] - they can help you locate a primary care doctor that  accepts your insurance, provides certain services, etc. - Physician Referral Service- [phone]  Chronic Pain Problems: Organization         Address  Phone   Notes  [facility]  [phone] Patients need to be referred by their primary care doctor.    Medication Assistance: Organization         Address  Phone   Notes  [facility] Medication [facility] [address] [place], [place] [address] [phone] --Must be a resident of [facility] -- Must have NO insurance coverage whatsoever (no Medicaid/ Medicare, etc.) -- The pt. MUST have a primary care doctor that directs their care regularly and follows them in the community   MedAssist  [phone]   [employer]  [phone]    Agencies that provide inexpensive medical care: Organization         Address  Phone   Notes  [place]  [phone]   [facility]    [phone]   [facility] [place], [place] [address] [phone]   [place] [address], [place] [phone]   Planned Parenthood    [phone]   [facility]    [phone]   [place] and [address], [place] Phone:  [phone], Fax:  [phone] Hours of Operation:  9 am - 6 pm, M-F.  Also accepts Medicaid/Medicare and self-pay.  [facility] for [address], [address], [place] Phone: [phone], Fax: [phone]. Hours of Operation:  8:30 am - 5:30 pm, M-F.  Also accepts Medicaid and self-pay.  [facility] [place] [address], [place] Phone: [phone]   [place], [place], [place] [phone], Ext. [phone] Mondays & Thursdays: 7-9 AM.  First 15 patients are seen on a first come, first serve basis.    [place] Providers:  Organization         Address  Phone   Notes  [facility] [address], [place] [phone] Also accepts self-pay patients.  [facility] [address], [place]  [phone]   [place], [address], [place] [phone]   [facility] [address], [place] [phone]   Lucianne Lei [address], [place]   [phone] Only accepts [place] Access [place] patients after they have their name applied to their card.   Self-Pay (no insurance) in [facility]:  Organization         Address  Phone   Notes  Sickle Cell Patients, [facility] Internal Medicine [place] [phone]   [facility] [place] [phone]   [facility] [place]  [place], [place], [place] [phone]   [facility]/Dr. Osei-Bonsu  [address] [place] or [address], [place] [phone] Phone number for both [place] and [place] locations is the same.  [facility] [address], [place] [phone]   [facility] [address], [place] or [address] [phone]   [facility] [address], [place] [phone], phone; [phone], fax Sees patients 1st and 3rd Saturday of every month.  Must not qualify for public or private insurance (i.e. Medicaid, Medicare, [place] Health Choice, Veterans' Benefits)  Household income should be no more than 200% of the poverty level The clinic cannot treat you if you are pregnant or think you are pregnant  Sexually transmitted diseases are not treated at the clinic.    Dental Care: Organization         Address  Phone  Notes  [facility] Department of [facility] [place] [phone] Accepts children up to age 55 who are enrolled in [place] or [place]; pregnant women with a Medicaid card; and children who have applied for Medicaid or  Health Choice, but were declined, whose parents can pay a reduced fee at time of service.    [facility]  [address], [place] [phone] Accepts children up to age 39 who are enrolled in [place] or [place]; pregnant women  with a Medicaid card; and children who have applied for Medicaid or [place] Health Choice, but were declined, whose parents can pay a reduced fee at time of service.  [facility] Access PRO[facility]  [place] [phone] Patients are seen by appointment only. Walk-ins are not accepted. [place] will see patients 45 years of age and older. Monday - Tuesday (8am-5pm) Most Wednesdays (8:30-5pm) $30 per visit, cash only  [facility] Adult Dental Access PROGRAM  [address], [facility] [phone] Patients are seen by appointment only. Walk-ins are not accepted. [place] will see patients 24 years of age and older. One Wednesday Evening (Monthly: Volunteer Based).  $30 per visit, cash only  Mor[place]  [phone] for adults; Children under age 78, call Graduate Pediatric Dentistry at [phone]. Children aged 39-14, please call [phone] to request a pediatric application.  Dental services are provided in all areas of dental care including fillings, crowns and bridges, complete and partial dentures, implants, gum treatment, root canals, and extractions. Preventive care is also provided. Treatment is provided to both adults and children. Patients are selected via a lottery and there is often a waiting list.   [facility] [address], [place]  [phone] [email or web address]   [facility] [address] [place], [place] [phone], Ext. [phone] Second and Fourth Thursday of each month, opens at 6:30 AM; Clinic ends at 9 AM.  Patients are seen on a first-come first-served basis, and a limited number are seen during each clinic.   [facility]  [address] Hillard Danker [place], [place] [phone]   Eligibility Requirements You must have lived in [place], [place], or [place] counties for at least the last three months.   You cannot be eligible for state or federal sponsored [employer], including [employer], [place], or [employer].   You generally cannot be eligible for healthcare insurance through your employer.    How to apply: Eligibility screenings are held every Tuesday and Wednesday afternoon from 1:00 pm until 4:00 pm. You do not need an appointment for the interview!  [facility] [address], [place], [place]   [place]  [place] Department  [place]  [phone]    Behavioral Health Resources in the Community: Intensive Outpatient Programs Organization         Address  Phone  Notes  [place]. [address] [place], [place] [phone]   [facility] Outpatient [address], [place], [place]   ADS: Alcohol & Drug Svcs [address] [place], [place]   [place] [address],  [place], [place] or [phone]   Substance Abuse Resources Organization         Address  Phone  Notes  Alcohol and Drug Services  [phone]   [place]  [phone]   [facility] [place]   Chinita Pester  [phone]   Residential & Outpatient Substance Abuse Program  [phone]   Psychological Services Organization         Address  Phone  Notes  [facility] [place] [place]  [place]  [phone]   [place] [address], [place] or [phone]    Mobile Crisis Teams Organization  Address  Phone  Notes  [facility], Mobile Crisis Care Unit  [phone]   [facility]ive Psychotherapeutic Services  [address] [place], [place]   [facility] [address] [place] [phone]    Self-Help/Support Groups Organization         Address  Phone             Notes  [place]. of [place] - variety of support groups  [place] Call for more information  Narcotics Anonymous (NA), [facility] [address], [employer] [place]  2 meetings at this location   Special educational needs teacher         Address  Phone  Notes  [facility]idential [facility] [place],    [place]  [phone]   [facility]  [address] [place] [phone], [place], [place]   [place], [place] [phone] Admissions: 8am-3pm M-F  Incentives Substance [place] [address]    [place], [place] [phone]   [facility] [address] [place], [place], [place]   [facility] [address],  [place], [place]   [facility] - Intensive Outpatient [address] Kristeen [address], [place], [place]   [facility] [address] [place].,  [place], [place] [phone] or [phone]   [facility] (RTS) [address], [place], [place] Accepts Medicaid  [facility] [place] [address]  [place] [phone] Substance Abuse/Addiction Treatment   [facility] Organization         Address  Phone  Notes  [facility]  [phone]   Domenic Schwab, PhD [address] Arlis Porta [place], [place]   [phone] or [phone]   [place] Amesti Amer[place], [place] [phone]   [facility] [address] [place], [place] [phone] Insurance/Medicaid/sponsorship through [facility] and Families [address], [address] [place]                                    [place], [place] [phone]  [place] [address] [place], [place] [phone]    Dr. Adele Schilder  [phone]   [facility] Dept. 1) [address], [place] 2) [place] 3)  [address], [place] [phone]  [phone]   [place] [phone] or [phone] (After Hours)

## 2014-04-22 NOTE — ED Notes (Signed)
Pt reports 4-5 days on fevers, chill. Noted blood in stools for the 4-5 days as well. Pt denies abdominal pain, c/o headache. Pt awake, alert, oriented x4, NAd at present.

## 2014-04-22 NOTE — ED Provider Notes (Signed)
CSN: [id number]     Arrival date & time 04/22/14  1148 History   First MD Initiated Contact with Patient 04/22/14 1207     Chief Complaint  Patient presents with  . GI Bleeding     (Consider location/radiation/quality/duration/timing/severity/associated sxs/prior Treatment) HPI  Jeremy Hayden is a 58 y.o. male complaining of 5 days of worsening subjective fever, chills, bright red blood per rectum, nonbloody, nonbilious, non-coffee ground emesis. Patient denies sick contacts, melena, abdominal pain, excessive alcohol use, excessive NSAID use. States his last colonoscopy was 2 years ago and did not show any gross abnormality. Patient has history of prostate cancer, is not treated, he denies any blood thinners, abdominal pain, chest pain. On review of systems he notes a lightheadedness when going from sitting to standing, sharp, global headache, rates it 9/10. Patient denies history of headaches, denies thunderclap onset, syncope, cervicalgia, nausea vomiting, change in vision, dysarthria, ataxia.  Past Medical History  Diagnosis Date  . Cancer     prostate  . Hypertension    History reviewed. No pertinent past surgical history. History reviewed. No pertinent family history. History  Substance Use Topics  . Smoking status: Never Smoker   . Smokeless tobacco: Not on file  . Alcohol Use: Yes     Comment: 40oz/day    Review of Systems  10 systems reviewed and found to be negative, except as noted in the HPI.   Allergies  Review of patient's allergies indicates no known allergies.  Home Medications   Prior to Admission medications   Medication Sig Start Date End Date Taking? Authorizing Provider  omeprazole (PRILOSEC) 20 MG capsule Take 20 mg by mouth daily.   Yes Historical Provider, MD  PRESCRIPTION MEDICATION Take 1 tablet by mouth daily. Blood pressure medication   Yes Historical Provider, MD  butalbital-acetaminophen-caffeine (FIORICET) 50-325-40 MG per tablet Take 1  tablet by mouth every 6 (six) hours as needed for headache. 04/22/14   Jerardo Costabile, PA-C   BP 155/82  Pulse 88  Temp(Src) 99 F (37.2 C) (Oral)  Resp 15  Ht 5\' 6"  (1.676 m)  Wt 160 lb (72.576 kg)  BMI 25.84 kg/m2  SpO2 99% Physical Exam  Nursing note and vitals reviewed. Constitutional: He is oriented to person, place, and time. He appears well-developed and well-nourished. No distress.  HENT:  Head: Normocephalic.  Mouth/Throat: Oropharynx is clear and moist.  No conjunctival pallor  Eyes: Conjunctivae and EOM are normal. Pupils are equal, round, and reactive to light.  Neck: Normal range of motion.  Cardiovascular: Normal rate, regular rhythm and intact distal pulses.   Pulmonary/Chest: Effort normal and breath sounds normal. No stridor. No respiratory distress. He has no wheezes. He has no rales. He exhibits no tenderness.  Abdominal: Soft. He exhibits no distension and no mass. There is no tenderness. There is no rebound and no guarding.  Hyperactive bowel sounds  Genitourinary:  Digital rectal exam a chaperoned by nurse:  No rashes or lesions, no external hemorrhoids, no fissures,normal rectal tone, normal stool color.  Musculoskeletal: Normal range of motion. He exhibits no edema.  Neurological: He is alert and oriented to person, place, and time.  Follows commands, Clear, goal oriented speech, Strength is 5 out of 5x4 extremities, patient ambulates with a coordinated in nonantalgic gait. Sensation is grossly intact.   Psychiatric: He has a normal mood and affect.    ED Course  Procedures (including critical care time) Labs Review Labs Reviewed  CBC - Abnormal; Notable for  the following:    WBC 3.7 (*)    MCH 35.8 (*)    All other components within normal limits  COMPREHENSIVE METABOLIC PANEL - Abnormal; Notable for the following:    Glucose, Bld 105 (*)    AST 197 (*)    ALT 64 (*)    GFR calc non Af Amer 89 (*)    Anion gap 18 (*)    All other components  within normal limits  PROTIME-INR  POC OCCULT BLOOD, ED  I-STAT CG4 LACTIC ACID, ED  TYPE AND SCREEN  ABO/RH    Imaging Review Ct Head Wo Contrast  04/22/2014   CLINICAL DATA:  Headache with fevers for 4 days  EXAM: CT HEAD WITHOUT CONTRAST  TECHNIQUE: Contiguous axial images were obtained from the base of the skull through the vertex without intravenous contrast.  COMPARISON:  None.  FINDINGS: The ventricles are normal in size and configuration. There is no mass, hemorrhage, extra-axial fluid collection, or midline shift. There is minimal small vessel disease in the centra semiovale bilaterally. Elsewhere gray-white compartments appear normal. No acute infarct apparent. Bony calvarium appears intact. Visualized mastoid air cells are clear. There is debris in the right external auditory canal.  IMPRESSION: Minimal supratentorial small vessel disease. No intracranial mass, hemorrhage, or acute appearing infarct. Probable cerumen in right external auditory canal.   Electronically Signed   By: Lowella Grip M.D.   On: 04/22/2014 15:18     EKG Interpretation None      MDM   Final diagnoses:  Nonintractable headache, unspecified chronicity pattern, unspecified headache type  BRBPR (bright red blood per rectum)    Filed Vitals:   04/22/14 1415 04/22/14 1445 04/22/14 1500 04/22/14 1540  BP: 146/95 158/91 150/94 155/82  Pulse: 91 88 91 88  Temp:    99 F (37.2 C)  TempSrc:    Oral  Resp:    15  Height:      Weight:      SpO2: 97% 100% 97% 99%    Medications  oxyCODONE-acetaminophen (PERCOCET/ROXICET) 5-325 MG per tablet 1 tablet (1 tablet Oral Given 04/22/14 1445)    Jeremy Hayden is a 57 y.o. male presenting with several episodes of bright red blood per rectum, shortness of breath and headache. Neuro exam is nonfocal, however this headache is atypical for him. Rectal exam without abnormality and guaiac is negative. He is not anemic, he does have elevated liver function tests.  I've advised patient he will need a recheck of these when he establishes primary care.   Evaluation does not show pathology that would require ongoing emergent intervention or inpatient treatment. Pt is hemodynamically stable and mentating appropriately. Discussed findings and plan with patient/guardian, who agrees with care plan. All questions answered. Return precautions discussed and outpatient follow up given.   Discharge Medication List as of 04/22/2014  3:36 PM    START taking these medications   Details  butalbital-acetaminophen-caffeine (FIORICET) 50-325-40 MG per tablet Take 1 tablet by mouth every 6 (six) hours as needed for headache., Starting 04/22/2014, Until Discontinued, [employer], PA-C 04/22/14 1635

## 2014-04-22 NOTE — ED Notes (Signed)
Patients lactic acid given to pisciotta

## 2014-04-22 NOTE — ED Provider Notes (Signed)
Medical screening examination/treatment/procedure(s) were performed by non-physician practitioner and as supervising physician I was immediately available for consultation/collaboration.   EKG Interpretation None        Debby Freiberg, MD 04/22/14 1720

## 2014-06-14 ENCOUNTER — Encounter (HOSPITAL_COMMUNITY): Payer: Self-pay | Admitting: *Deleted

## 2014-06-14 ENCOUNTER — Emergency Department (HOSPITAL_COMMUNITY): Payer: Self-pay

## 2014-06-14 ENCOUNTER — Emergency Department (HOSPITAL_COMMUNITY)
Admission: EM | Admit: 2014-06-14 | Discharge: 2014-06-14 | Disposition: A | Discharge status: Home / Self Care | Payer: Self-pay | Attending: Emergency Medicine | Admitting: Emergency Medicine

## 2014-06-14 DIAGNOSIS — K921 Melena: Secondary | ICD-10-CM | POA: Insufficient documentation

## 2014-06-14 DIAGNOSIS — Z8546 Personal history of malignant neoplasm of prostate: Secondary | ICD-10-CM | POA: Insufficient documentation

## 2014-06-14 DIAGNOSIS — R0602 Shortness of breath: Secondary | ICD-10-CM | POA: Insufficient documentation

## 2014-06-14 DIAGNOSIS — Z79899 Other long term (current) drug therapy: Secondary | ICD-10-CM | POA: Insufficient documentation

## 2014-06-14 DIAGNOSIS — M5412 Radiculopathy, cervical region: Secondary | ICD-10-CM | POA: Insufficient documentation

## 2014-06-14 DIAGNOSIS — M79602 Pain in left arm: Secondary | ICD-10-CM | POA: Insufficient documentation

## 2014-06-14 DIAGNOSIS — K625 Hemorrhage of anus and rectum: Secondary | ICD-10-CM

## 2014-06-14 DIAGNOSIS — C61 Malignant neoplasm of prostate: Secondary | ICD-10-CM

## 2014-06-14 DIAGNOSIS — M5030 Other cervical disc degeneration, unspecified cervical region: Secondary | ICD-10-CM | POA: Insufficient documentation

## 2014-06-14 DIAGNOSIS — R51 Headache: Secondary | ICD-10-CM | POA: Insufficient documentation

## 2014-06-14 DIAGNOSIS — R29898 Other symptoms and signs involving the musculoskeletal system: Secondary | ICD-10-CM

## 2014-06-14 DIAGNOSIS — I10 Essential (primary) hypertension: Secondary | ICD-10-CM | POA: Insufficient documentation

## 2014-06-14 DIAGNOSIS — M79603 Pain in arm, unspecified: Secondary | ICD-10-CM

## 2014-06-14 LAB — COMPREHENSIVE METABOLIC PANEL
ALT: 23 U/L (ref 0–53)
AST: 48 U/L — ABNORMAL HIGH (ref 0–37)
Albumin: 3.6 g/dL (ref 3.5–5.2)
Alkaline Phosphatase: 54 U/L (ref 39–117)
Anion gap: 12 (ref 5–15)
BUN: 13 mg/dL (ref 6–23)
CALCIUM: 8.7 mg/dL (ref 8.4–10.5)
CO2: 25 mEq/L (ref 19–32)
Chloride: 108 mEq/L (ref 96–112)
Creatinine, Ser: 0.94 mg/dL (ref 0.50–1.35)
GFR calc non Af Amer: 90 mL/min — ABNORMAL LOW (ref >90)
GLUCOSE: 95 mg/dL (ref 70–99)
Potassium: 4.3 mEq/L (ref 3.7–5.3)
Sodium: 145 mEq/L (ref 137–147)
TOTAL PROTEIN: 6.8 g/dL (ref 6.0–8.3)
Total Bilirubin: 0.5 mg/dL (ref 0.3–1.2)

## 2014-06-14 LAB — POC OCCULT BLOOD, ED: FECAL OCCULT BLD: POSITIVE — AB

## 2014-06-14 LAB — CBC WITH DIFFERENTIAL/PLATELET
Basophils Absolute: 0 10*3/uL (ref 0.0–0.1)
Basophils Relative: 0 % (ref 0–1)
EOS ABS: 0 10*3/uL (ref 0.0–0.7)
EOS PCT: 1 % (ref 0–5)
HEMATOCRIT: 45.9 % (ref 39.0–52.0)
HEMOGLOBIN: 15.8 g/dL (ref 13.0–17.0)
LYMPHS PCT: 19 % (ref 12–46)
Lymphs Abs: 0.6 10*3/uL — ABNORMAL LOW (ref 0.7–4.0)
MCH: 33.8 pg (ref 26.0–34.0)
MCHC: 34.4 g/dL (ref 30.0–36.0)
MCV: 98.1 fL (ref 78.0–100.0)
MONO ABS: 0.3 10*3/uL (ref 0.1–1.0)
MONOS PCT: 11 % (ref 3–12)
Neutro Abs: 2 10*3/uL (ref 1.7–7.7)
Neutrophils Relative %: 69 % (ref 43–77)
Platelets: 180 10*3/uL (ref 150–400)
RBC: 4.68 MIL/uL (ref 4.22–5.81)
RDW: 11.9 % (ref 11.5–15.5)
WBC: 2.9 10*3/uL — AB (ref 4.0–10.5)

## 2014-06-14 LAB — URINALYSIS, ROUTINE W REFLEX MICROSCOPIC
BILIRUBIN URINE: NEGATIVE
Glucose, UA: NEGATIVE mg/dL
Hgb urine dipstick: NEGATIVE
KETONES UR: NEGATIVE mg/dL
Leukocytes, UA: NEGATIVE
Nitrite: NEGATIVE
PH: 5.5 (ref 5.0–8.0)
Protein, ur: NEGATIVE mg/dL
Specific Gravity, Urine: 1.025 (ref 1.005–1.030)
UROBILINOGEN UA: 1 mg/dL (ref 0.0–1.0)

## 2014-06-14 LAB — I-STAT TROPONIN, ED: Troponin i, poc: 0 ng/mL (ref 0.00–0.08)

## 2014-06-14 MED ORDER — HYDROCODONE-ACETAMINOPHEN 5-325 MG PO TABS: 1.0000 | ORAL_TABLET | Freq: Four times a day (QID) | ORAL | Status: DC | PRN

## 2014-06-14 MED ORDER — GADOBENATE DIMEGLUMINE 529 MG/ML IV SOLN
15.0000 mL | Freq: Once | INTRAVENOUS | Status: AC
Start: 2014-06-14 — End: 2014-06-14
  Administered 2014-06-14: 15 mL via INTRAVENOUS

## 2014-06-14 MED ORDER — PREDNISONE 10 MG PO TABS: ORAL_TABLET | ORAL | Status: DC

## 2014-06-14 NOTE — ED Notes (Signed)
PA at bedside discussing MRI with patient and plan of care.

## 2014-06-14 NOTE — ED Notes (Signed)
Pt leaving for MRI at this time

## 2014-06-14 NOTE — ED Notes (Signed)
Pt states that he has had left arm pain and tingling. Reports intermittent headaches as well. Pt denies any recent injuries. Grip weak in left hand. Pt states that "it is tingling". No other neuro deficits noted.

## 2014-06-14 NOTE — ED Provider Notes (Signed)
CSN: [id number]     Arrival date & time 06/14/14  0720 History   First MD Initiated Contact with Patient 06/14/14 [phone]     Chief Complaint  Patient presents with  . Arm Pain   HPI  Patient is a 58 year old male with past medical history of prostate cancer which is not currently being treated, and hypertension who presents to the emergency room for evaluation of left arm pain which has been going on for nearly 1 week. Patient states that he is having a tooth ache-like pain that comes in waves that radiates all the way down to his fingertips from the left side of his neck. Patient states the pain is a 10 out of 10. He has been trying BC powders at home with minimal relief. He also notes some tingling and numbness in his left fingers. The numbness got worse this morning which prompted him to come to the emergency room. Patient denies any aggravating factors that he has noticed at this time. He has no history of neck problems including bulging disks, surgery, or injuries. He also complains of intermittent shortness of breath which is abnormal for him. And yesterday he noticed that he had bright red blood coating his stool and also blood on his tissue when he wiped. He says that this is new. He denies any rectal pain, diarrhea, constipation, or melena. He is not on a blood thinner at this time. He denies palpitations or chest pain. Patient states that he was told he had a little bit of prostate cancer by the doctors at the [facility] in [place]. They wanted to observe him. He has not had any treatment for his prostate cancer at this time. Per the nursing note patient has also been having intermittent headaches which he did not mention to me. The nursing note also mentions that he has been out of his blood pressure medication for the past 3 weeks.   Past Medical History  Diagnosis Date  . Cancer     prostate  . Hypertension    History reviewed. No pertinent past surgical history. No family history on  file. History  Substance Use Topics  . Smoking status: Never Smoker   . Smokeless tobacco: Not on file  . Alcohol Use: Yes     Comment: 40oz/day    Review of Systems  Constitutional: Negative for fever, chills and fatigue.  Respiratory: Positive for shortness of breath. Negative for cough, chest tightness and wheezing.   Cardiovascular: Negative for chest pain, palpitations and leg swelling.  Gastrointestinal: Positive for blood in stool. Negative for nausea, vomiting, abdominal pain, diarrhea, constipation, abdominal distention and anal bleeding.  Genitourinary: Negative for dysuria, urgency, frequency, hematuria, enuresis and difficulty urinating.  Musculoskeletal: Positive for arthralgias, gait problem and neck pain. Negative for myalgias, back pain, joint swelling and neck stiffness.  Neurological: Positive for weakness, numbness and headaches. Negative for dizziness and syncope.  All other systems reviewed and are negative.     Allergies  Review of patient's allergies indicates no known allergies.  Home Medications   Prior to Admission medications   Medication Sig Start Date End Date Taking? Authorizing Provider  Aspirin-Salicylamide-Caffeine (BC HEADACHE POWDER PO) Take 1 packet by mouth daily as needed (headache/pain).   Yes Historical Provider, MD  omeprazole (PRILOSEC) 20 MG capsule Take 20 mg by mouth daily.   Yes Historical Provider, MD  PRESCRIPTION MEDICATION Take 1 tablet by mouth daily. Blood pressure medication   Yes Historical Provider, MD  butalbital-acetaminophen-caffeine (FIORICET)  50-325-40 MG per tablet Take 1 tablet by mouth every 6 (six) hours as needed for headache. 04/22/14   Nicole Pisciotta, PA-C  HYDROcodone-acetaminophen (NORCO/VICODIN) 5-325 MG per tablet Take 1 tablet by mouth every 6 (six) hours as needed for moderate pain or severe pain. 06/14/14   Yvone Slape A Forcucci, PA-C  predniSONE (DELTASONE) 10 MG tablet Day 1 take 6 pills Day 2 take 5  pills Day 3 take 4 pills Day 4 take 3 pills Day 5 take 2 pills Day 6 take 1 pill 06/14/14   Riann Oman A Forcucci, PA-C   BP 148/97 mmHg  Pulse 76  Temp(Src) 97.8 F (36.6 C) (Oral)  Resp 15  Ht 5\' 6"  (1.676 m)  Wt 170 lb (77.111 kg)  BMI 27.45 kg/m2  SpO2 99% Physical Exam  Constitutional: He is oriented to person, place, and time. He appears well-developed and well-nourished. No distress.  HENT:  Head: Normocephalic and atraumatic.  Mouth/Throat: Oropharynx is clear and moist. No oropharyngeal exudate.  Eyes: Conjunctivae and EOM are normal. Pupils are equal, round, and reactive to light. No scleral icterus.  Neck: Normal range of motion. Neck supple. No JVD present. No thyromegaly present.  Positive Spurling sign bilaterally.  Cardiovascular: Normal rate, regular rhythm, normal heart sounds and intact distal pulses.  Exam reveals no gallop and no friction rub.   No murmur heard. Pulmonary/Chest: Effort normal and breath sounds normal. No respiratory distress. He has no wheezes. He has no rales. He exhibits no tenderness.  Abdominal: Soft. Bowel sounds are normal. He exhibits no distension and no mass. There is no tenderness. There is no rebound and no guarding.  Genitourinary: Rectal exam shows no external hemorrhoid, no internal hemorrhoid, no fissure, no mass, no tenderness and anal tone normal. Guaiac positive stool.  Soft brown stool in the rectal vault.  No visible blood on the glove.    Lymphadenopathy:    He has no cervical adenopathy.  Neurological: He is alert and oriented to person, place, and time. A sensory deficit is present. No cranial nerve deficit. Coordination normal.  There is sensory deficit to light touch noted on the left upper extremity. There is 4 out of 5 strength with biceps flexion, triceps extension, wrist extension, interosseous strength, and thumb opposition on the left upper extremity as compared to the right.  Skin: Skin is warm and dry. He is not  diaphoretic.  Psychiatric: He has a normal mood and affect. His behavior is normal. Judgment and thought content normal.  Nursing note and vitals reviewed.   ED Course  Procedures (including critical care time) Labs Review Labs Reviewed  CBC WITH DIFFERENTIAL - Abnormal; Notable for the following:    WBC 2.9 (*)    Lymphs Abs 0.6 (*)    All other components within normal limits  COMPREHENSIVE METABOLIC PANEL - Abnormal; Notable for the following:    AST 48 (*)    GFR calc non Af Amer 90 (*)    All other components within normal limits  POC OCCULT BLOOD, ED - Abnormal; Notable for the following:    Fecal Occult Bld POSITIVE (*)    All other components within normal limits  URINALYSIS, ROUTINE W REFLEX MICROSCOPIC  I-STAT TROPOININ, ED    Imaging Review Dg Chest 2 View  06/14/2014   CLINICAL DATA:  Sharp left-sided chest pain extending into left arm.  EXAM: CHEST  2 VIEW  COMPARISON:  12/25/2009  FINDINGS: The heart size and mediastinal contours are within normal limits.  Both lungs are clear. The visualized skeletal structures are unremarkable.  IMPRESSION: No active cardiopulmonary disease.   Electronically Signed   By: Rozetta Nunnery M.D.   On: 06/14/2014 08:46   Mr Brain Wo Contrast  06/14/2014   CLINICAL DATA:  Left arm weakness  EXAM: MRI HEAD WITHOUT CONTRAST  TECHNIQUE: Multiplanar, multiecho pulse sequences of the brain and surrounding structures were obtained without intravenous contrast.  COMPARISON:  CT head 04/22/2014  FINDINGS: Ventricle size is normal. Cerebral volume is normal. Pituitary normal in size. Calvarium intact.  Negative for acute infarct.  Subcortical and deep white matter hyperintensities bilaterally. Some of these extend to the subcortical periventricular white matter. Brainstem and cerebellum are normal. No cortical infarct  Negative for hemorrhage or fluid collection. Negative for mass or edema.  IMPRESSION: Negative for acute infarct  Scattered white matter  hyperintensities bilaterally are nonspecific but most likely related to microvascular ischemia.   Electronically Signed   By: Franchot Gallo M.D.   On: 06/14/2014 10:55   Mr Cervical Spine W Wo Contrast  06/14/2014   CLINICAL DATA:  Left arm tingling with sensation defects. Weakness in the left leg. Headaches.  EXAM: MRI CERVICAL SPINE WITHOUT AND WITH CONTRAST  TECHNIQUE: Multiplanar and multiecho pulse sequences of the cervical spine, to include the craniocervical junction and cervicothoracic junction, were obtained according to standard protocol without and with intravenous contrast.  CONTRAST:  52mL MULTIHANCE GADOBENATE DIMEGLUMINE 529 MG/ML IV SOLN  COMPARISON:  Report from 10/16/2002  FINDINGS: Anterior plate and screw fixator at C4-C5-C6 with solid interbody fusion.  No significant abnormal spinal cord signal is observed. No significant vertebral marrow edema is identified. There is 2 mm anterior subluxation at C7-T1 probably related to the underlying degenerative facet arthropathy.  No significant abnormal cervical spine enhancement. No enhancing cervical cord lesion is observed.  Additional findings at individual levels are as follows:  C2-3: Mild right and borderline left foraminal stenosis due to right foraminal disc protrusion, uncinate spurring, and left facet arthropathy.  C3-4: Prominent bilateral foraminal stenosis due to uncinate spurring, facet arthropathy, and disc bulge. Mild central narrowing of the thecal sac  C4-5:  No impingement.  This level is fused.  C5-6:  Mild left foraminal stenosis due to facet arthropathy.  C6-7: Moderate bilateral foraminal stenosis due to disc bulge, uncinate spurring, and facet arthropathy.  C7-T1: Borderline bilateral foraminal stenosis due to disc uncovering and facet arthropathy.  IMPRESSION: 1. Cervical spondylosis and degenerative disc disease, causing prominent impingement at C3-4; moderate impingement at C6-7 ; and mild impingement at C2-3 and C5-6, as  detailed above. 2. Prior ACDF at C4-C5-C6 with solid interbody fusion.   Electronically Signed   By: Sherryl Barters M.D.   On: 06/14/2014 10:47     EKG Interpretation   Date/Time:  Tuesday June 14 2014 07:24:02 EST Ventricular Rate:  86 PR Interval:  158 QRS Duration: 90 QT Interval:  374 QTC Calculation: 447 R Axis:   -49 Text Interpretation:  Normal sinus rhythm Left anterior fascicular block  Possible Anterior infarct , age undetermined Electrode noise Since last  tracing 26 Dec 2009 T wave inversion no longer evident in Lateral leads  Confirmed by KNAPP  MD-I, IVA ([id number]) on 06/14/2014 8:50:13 AM      MDM   Final diagnoses:  Arm pain  Left arm weakness  Cervical radiculopathy  Degenerative disc disease, cervical  Prostate cancer  Rectal bleeding   Patient is a 58 year old male who presents emergency room  for evaluation of left arm pain, shortness of breath, and blood in his stool. Physical exam reveals an alert and oriented male with left upper extremity weakness, and no other focal neurological deficits. Patient does have a history of prostate cancer which is not currently being treated. Given history of prostate cancer, and left-sided weakness I performed an MRI of the brain and of the cervical spine. Cervical spine MRI reveals multilevel degenerative disc disease with ACDF, and management at multiple levels of the left-sided nerve roots. MRI of the brain is negative. Chest x-ray is negative. EKG is improved with upright T waves where there were previous inversions. The reveals stable hemoglobin. CMP is unremarkable. UA is negative. I-STAT troponin is negative. There is trace positive Hemoccult results. I did not see any hemorrhoids, fissures, or skin breakdown on exam. I do not know the source of the rectal bleeding. I will have the patient follow-up with the [facility] in [place]. I will send the patient home with prednisone taper, hydrocodone.  Patient cannot remember what  his blood pressure medication is so I cannot give him a refill. I will have him follow up closely with the [facility] in [place] where he receives his primary care. I will also give the patient a referral to neurosurgery. Patient is to return for worsening rectal bleeding, abdominal pain, increasing neck pain, loss of bowel or bladder, or saddle anesthesias. Patient states understanding and agreement at this time. Patient is stable for discharge. I have discussed this patient with Dr. Tomi Bamberger who agrees with the above workup and plan.  Cherylann Parr, PA-C 06/14/14 [place], MD 06/14/14 [phone]

## 2014-06-14 NOTE — ED Notes (Addendum)
Pt complaining of left arm tingling; sensation deficits noted in left arm. Pt weak in left leg as well. Pt states he has been out of his bp medicine for 3 weeks. Pt has been having headaches for the last week as well, taking bc powders for the pain. Reports some relief with bc powder. Pt states numbness and tingling got worse this morning, which is why he is here. Reports left arm tingling radiates down his arm and up into his neck. Denies sob, chest pain, incontinence. Pt states yesterday he noticed bright red blood in his stool.

## 2014-06-14 NOTE — Discharge Instructions (Signed)
Cervical Radiculopathy Cervical radiculopathy happens when a nerve in the neck is pinched or bruised by a slipped (herniated) disk or by arthritic changes in the bones of the cervical spine. This can occur due to an injury or as part of the normal aging process. Pressure on the cervical nerves can cause pain or numbness that runs from your neck all the way down into your arm and fingers. CAUSES  There are many possible causes, including:  Injury.  Muscle tightness in the neck from overuse.  Swollen, painful joints (arthritis).  Breakdown or degeneration in the bones and joints of the spine (spondylosis) due to aging.  Bone spurs that may develop near the cervical nerves. SYMPTOMS  Symptoms include pain, weakness, or numbness in the affected arm and hand. Pain can be severe or irritating. Symptoms may be worse when extending or turning the neck. DIAGNOSIS  Your caregiver will ask about your symptoms and do a physical exam. He or she may test your strength and reflexes. X-rays, CT scans, and MRI scans may be needed in cases of injury or if the symptoms do not go away after a period of time. Electromyography (EMG) or nerve conduction testing may be done to study how your nerves and muscles are working. TREATMENT  Your caregiver may recommend certain exercises to help relieve your symptoms. Cervical radiculopathy can, and often does, get better with time and treatment. If your problems continue, treatment options may include:  Wearing a soft collar for short periods of time.  Physical therapy to strengthen the neck muscles.  Medicines, such as nonsteroidal anti-inflammatory drugs (NSAIDs), oral corticosteroids, or spinal injections.  Surgery. Different types of surgery may be done depending on the cause of your problems. HOME CARE INSTRUCTIONS   Put ice on the affected area.  Put ice in a plastic bag.  Place a towel between your skin and the bag.  Leave the ice on for 15-20 minutes,  03-04 times a day or as directed by your caregiver.  If ice does not help, you can try using heat. Take a warm shower or bath, or use a hot water bottle as directed by your caregiver.  You may try a gentle neck and shoulder massage.  Use a flat pillow when you sleep.  Only take over-the-counter or prescription medicines for pain, discomfort, or fever as directed by your caregiver.  If physical therapy was prescribed, follow your caregiver's directions.  If a soft collar was prescribed, use it as directed. SEEK IMMEDIATE MEDICAL CARE IF:   Your pain gets much worse and cannot be controlled with medicines.  You have weakness or numbness in your hand, arm, face, or leg.  You have a high fever or a stiff, rigid neck.  You lose bowel or bladder control (incontinence).  You have trouble with walking, balance, or speaking. MAKE SURE YOU:   Understand these instructions.  Will watch your condition.  Will get help right away if you are not doing well or get worse. Document Released: 03/26/2001 Document Revised: 09/23/2011 Document Reviewed: 02/12/2011 [facility] Patient Information 2015 [place], [place]. This information is not intended to replace advice given to you by your health care provider. Make sure you discuss any questions you have with your health care provider.  Degenerative Disk Disease Degenerative disk disease is a condition caused by the changes that occur in the cushions of the backbone (spinal disks) as you grow older. Spinal disks are soft and compressible disks located between the bones of the spine (  vertebrae). They act like shock absorbers. Degenerative disk disease can affect the whole spine. However, the neck and lower back are most commonly affected. Many changes can occur in the spinal disks with aging, such as:  The spinal disks may dry and shrink.  Small tears may occur in the tough, outer covering of the disk (annulus).  The disk space may become smaller due  to loss of water.  Abnormal growths in the bone (spurs) may occur. This can put pressure on the nerve roots exiting the spinal canal, causing pain.  The spinal canal may become narrowed. CAUSES  Degenerative disk disease is a condition caused by the changes that occur in the spinal disks with aging. The exact cause is not known, but there is a genetic basis for many patients. Degenerative changes can occur due to loss of fluid in the disk. This makes the disk thinner and reduces the space between the backbones. Small cracks can develop in the outer layer of the disk. This can lead to the breakdown of the disk. You are more likely to get degenerative disk disease if you are overweight. Smoking cigarettes and doing heavy work such as weightlifting can also increase your risk of this condition. Degenerative changes can start after a sudden injury. Growth of bone spurs can compress the nerve roots and cause pain.  SYMPTOMS  The symptoms vary from person to person. Some people may have no pain, while others have severe pain. The pain may be so severe that it can limit your activities. The location of the pain depends on the part of your backbone that is affected. You will have neck or arm pain if a disk in the neck area is affected. You will have pain in your back, buttocks, or legs if a disk in the lower back is affected. The pain becomes worse while bending, reaching up, or with twisting movements. The pain may start gradually and then get worse as time passes. It may also start after a major or minor injury. You may feel numbness or tingling in the arms or legs.  DIAGNOSIS  Your caregiver will ask you about your symptoms and about activities or habits that may cause the pain. He or she may also ask about any injuries, diseases, or treatments you have had earlier. Your caregiver will examine you to check for the range of movement that is possible in the affected area, to check for strength in your  extremities, and to check for sensation in the areas of the arms and legs supplied by different nerve roots. An X-ray of the spine may be taken. Your caregiver may suggest other imaging tests, such as magnetic resonance imaging (MRI), if needed.  TREATMENT  Treatment includes rest, modifying your activities, and applying ice and heat. Your caregiver may prescribe medicines to reduce your pain and may ask you to do some exercises to strengthen your back. In some cases, you may need surgery. You and your caregiver will decide on the treatment that is best for you. HOME CARE INSTRUCTIONS   Follow proper lifting and walking techniques as advised by your caregiver.  Maintain good posture.  Exercise regularly as advised.  Perform relaxation exercises.  Change your sitting, standing, and sleeping habits as advised. Change positions frequently.  Lose weight as advised.  Stop smoking if you smoke.  Wear supportive footwear. SEEK MEDICAL CARE IF:  Your pain does not go away within 1 to 4 weeks. SEEK IMMEDIATE MEDICAL CARE IF:   Your  pain is severe.  You notice weakness in your arms, hands, or legs.  You begin to lose control of your bladder or bowel movements. MAKE SURE YOU:   Understand these instructions.  Will watch your condition.  Will get help right away if you are not doing well or get worse. Document Released: 04/28/2007 Document Revised: 09/23/2011 Document Reviewed: 11/02/2013 [facility] Patient Information 2015 [place], [place]. This information is not intended to replace advice given to you by your health care provider. Make sure you discuss any questions you have with your health care provider.  Rectal Bleeding Rectal bleeding is when blood passes out of the anus. It is usually a sign that something is wrong. It may not be serious, but it should always be evaluated. Rectal bleeding may present as bright red blood or extremely dark stools. The color may range from dark red or  maroon to black (like tar). It is important that the cause of rectal bleeding be identified so treatment can be started and the problem corrected. CAUSES   Hemorrhoids. These are enlarged (dilated) blood vessels or veins in the anal or rectal area.  Fistulas. Theseare abnormal, burrowing channels that usually run from inside the rectum to the skin around the anus. They can bleed.  Anal fissures. This is a tear in the tissue of the anus. Bleeding occurs with bowel movements.  Diverticulosis. This is a condition in which pockets or sacs project from the bowel wall. Occasionally, the sacs can bleed.  Diverticulitis. Thisis an infection involving diverticulosis of the colon.  Proctitis and colitis. These are conditions in which the rectum, colon, or both, can become inflamed and pitted (ulcerated).  Polyps and cancer. Polyps are non-cancerous (benign) growths in the colon that may bleed. Certain types of polyps turn into cancer.  Protrusion of the rectum. Part of the rectum can project from the anus and bleed.  Certain medicines.  Intestinal infections.  Blood vessel abnormalities. HOME CARE INSTRUCTIONS  Eat a high-fiber diet to keep your stool soft.  Limit activity.  Drink enough fluids to keep your urine clear or pale yellow.  Warm baths may be useful to soothe rectal pain.  Follow up with your caregiver as directed. SEEK IMMEDIATE MEDICAL CARE IF:  You develop increased bleeding.  You have black or dark red stools.  You vomit blood or material that looks like coffee grounds.  You have abdominal pain or tenderness.  You have a fever.  You feel weak, nauseous, or you faint.  You have severe rectal pain or you are unable to have a bowel movement. MAKE SURE YOU:  Understand these instructions.  Will watch your condition.  Will get help right away if you are not doing well or get worse. Document Released: 12/21/2001 Document Revised: 09/23/2011 Document Reviewed:  12/16/2010 [facility] Patient Information 2015 [place], [place]. This information is not intended to replace advice given to you by your health care provider. Make sure you discuss any questions you have with your health care provider.

## 2015-03-26 ENCOUNTER — Encounter (HOSPITAL_COMMUNITY): Payer: Self-pay | Admitting: Nurse Practitioner

## 2015-03-26 ENCOUNTER — Inpatient Hospital Stay (HOSPITAL_COMMUNITY)
Admission: EM | Admit: 2015-03-26 | Discharge: 2015-03-28 | DRG: 309 | Disposition: A | Discharge status: Home / Self Care | Payer: Non-veteran care | Attending: Internal Medicine | Admitting: Internal Medicine

## 2015-03-26 ENCOUNTER — Emergency Department (HOSPITAL_COMMUNITY): Payer: Non-veteran care

## 2015-03-26 DIAGNOSIS — K921 Melena: Secondary | ICD-10-CM

## 2015-03-26 DIAGNOSIS — I4891 Unspecified atrial fibrillation: Secondary | ICD-10-CM | POA: Diagnosis present

## 2015-03-26 DIAGNOSIS — C61 Malignant neoplasm of prostate: Secondary | ICD-10-CM | POA: Diagnosis present

## 2015-03-26 DIAGNOSIS — R112 Nausea with vomiting, unspecified: Secondary | ICD-10-CM | POA: Diagnosis present

## 2015-03-26 DIAGNOSIS — E860 Dehydration: Secondary | ICD-10-CM | POA: Diagnosis present

## 2015-03-26 DIAGNOSIS — K219 Gastro-esophageal reflux disease without esophagitis: Secondary | ICD-10-CM | POA: Diagnosis present

## 2015-03-26 DIAGNOSIS — K760 Fatty (change of) liver, not elsewhere classified: Secondary | ICD-10-CM | POA: Diagnosis present

## 2015-03-26 DIAGNOSIS — I10 Essential (primary) hypertension: Secondary | ICD-10-CM | POA: Diagnosis present

## 2015-03-26 DIAGNOSIS — K449 Diaphragmatic hernia without obstruction or gangrene: Secondary | ICD-10-CM | POA: Diagnosis present

## 2015-03-26 DIAGNOSIS — I480 Paroxysmal atrial fibrillation: Secondary | ICD-10-CM | POA: Diagnosis present

## 2015-03-26 DIAGNOSIS — K920 Hematemesis: Secondary | ICD-10-CM | POA: Diagnosis present

## 2015-03-26 DIAGNOSIS — K529 Noninfective gastroenteritis and colitis, unspecified: Secondary | ICD-10-CM | POA: Diagnosis present

## 2015-03-26 DIAGNOSIS — R079 Chest pain, unspecified: Secondary | ICD-10-CM | POA: Diagnosis present

## 2015-03-26 DIAGNOSIS — R1084 Generalized abdominal pain: Secondary | ICD-10-CM

## 2015-03-26 HISTORY — DX: Gastro-esophageal reflux disease without esophagitis: K21.9

## 2015-03-26 LAB — URINALYSIS, ROUTINE W REFLEX MICROSCOPIC
Glucose, UA: NEGATIVE mg/dL
Leukocytes, UA: NEGATIVE
Nitrite: NEGATIVE
PROTEIN: 30 mg/dL — AB
SPECIFIC GRAVITY, URINE: 1.018 (ref 1.005–1.030)
UROBILINOGEN UA: 1 mg/dL (ref 0.0–1.0)
pH: 5.5 (ref 5.0–8.0)

## 2015-03-26 LAB — I-STAT TROPONIN, ED: TROPONIN I, POC: 0 ng/mL (ref 0.00–0.08)

## 2015-03-26 LAB — COMPREHENSIVE METABOLIC PANEL
ALT: 37 U/L (ref 17–63)
AST: 93 U/L — ABNORMAL HIGH (ref 15–41)
Albumin: 4.3 g/dL (ref 3.5–5.0)
Alkaline Phosphatase: 91 U/L (ref 38–126)
Anion gap: 22 — ABNORMAL HIGH (ref 5–15)
BILIRUBIN TOTAL: 1.3 mg/dL — AB (ref 0.3–1.2)
BUN: 7 mg/dL (ref 6–20)
CALCIUM: 9.5 mg/dL (ref 8.9–10.3)
CO2: 17 mmol/L — ABNORMAL LOW (ref 22–32)
Chloride: 94 mmol/L — ABNORMAL LOW (ref 101–111)
Creatinine, Ser: 1.25 mg/dL — ABNORMAL HIGH (ref 0.61–1.24)
GFR calc Af Amer: >60 mL/min (ref >60)
Glucose, Bld: 79 mg/dL (ref 65–99)
Potassium: 4.4 mmol/L (ref 3.5–5.1)
Sodium: 133 mmol/L — ABNORMAL LOW (ref 135–145)
TOTAL PROTEIN: 8.1 g/dL (ref 6.5–8.1)

## 2015-03-26 LAB — I-STAT CG4 LACTIC ACID, ED
LACTIC ACID, VENOUS: 1.52 mmol/L (ref 0.5–2.0)
Lactic Acid, Venous: 2.07 mmol/L (ref 0.5–2.0)

## 2015-03-26 LAB — CBC
HCT: 50.6 % (ref 39.0–52.0)
Hemoglobin: 18.3 g/dL — ABNORMAL HIGH (ref 13.0–17.0)
MCH: 36 pg — ABNORMAL HIGH (ref 26.0–34.0)
MCHC: 36.2 g/dL — ABNORMAL HIGH (ref 30.0–36.0)
MCV: 99.6 fL (ref 78.0–100.0)
Platelets: 179 10*3/uL (ref 150–400)
RBC: 5.08 MIL/uL (ref 4.22–5.81)
RDW: 11.6 % (ref 11.5–15.5)
WBC: 4.1 10*3/uL (ref 4.0–10.5)

## 2015-03-26 LAB — URINE MICROSCOPIC-ADD ON

## 2015-03-26 LAB — MRSA PCR SCREENING: MRSA by PCR: NEGATIVE

## 2015-03-26 LAB — TYPE AND SCREEN
ABO/RH(D): AB POS
ANTIBODY SCREEN: NEGATIVE

## 2015-03-26 LAB — LIPASE, BLOOD: Lipase: 36 U/L (ref 22–51)

## 2015-03-26 MED ORDER — DILTIAZEM LOAD VIA INFUSION
15.0000 mg | Freq: Once | INTRAVENOUS | Status: AC
  Administered 2015-03-26: 15 mg via INTRAVENOUS

## 2015-03-26 MED ORDER — DILTIAZEM HCL 100 MG IV SOLR
5.0000 mg/h | INTRAVENOUS | Status: AC
  Administered 2015-03-26: 5 mg/h via INTRAVENOUS
  Administered 2015-03-27: 10 mg/h via INTRAVENOUS
  Filled 2015-03-26 (×2): qty 100

## 2015-03-26 MED ORDER — HYDROMORPHONE HCL 1 MG/ML IJ SOLN
1.0000 mg | Freq: Once | INTRAMUSCULAR | Status: AC
  Administered 2015-03-26: 1 mg via INTRAVENOUS
  Filled 2015-03-26: qty 1

## 2015-03-26 MED ORDER — ONDANSETRON HCL 4 MG PO TABS: 4.0000 mg | ORAL_TABLET | Freq: Four times a day (QID) | ORAL | Status: DC | PRN

## 2015-03-26 MED ORDER — HYDROCODONE-ACETAMINOPHEN 5-325 MG PO TABS: 1.0000 | ORAL_TABLET | ORAL | Status: DC | PRN

## 2015-03-26 MED ORDER — SODIUM CHLORIDE 0.9 % IV SOLN
INTRAVENOUS | Status: AC
  Administered 2015-03-26: 23:00:00 via INTRAVENOUS

## 2015-03-26 MED ORDER — ACETAMINOPHEN 650 MG RE SUPP: 650.0000 mg | Freq: Four times a day (QID) | RECTAL | Status: DC | PRN

## 2015-03-26 MED ORDER — SODIUM CHLORIDE 0.9 % IV BOLUS (SEPSIS)
1000.0000 mL | Freq: Once | INTRAVENOUS | Status: AC
  Administered 2015-03-26: 1000 mL via INTRAVENOUS

## 2015-03-26 MED ORDER — ONDANSETRON HCL 4 MG/2ML IJ SOLN: 4.0000 mg | Freq: Four times a day (QID) | INTRAMUSCULAR | Status: DC | PRN

## 2015-03-26 MED ORDER — IOHEXOL 300 MG/ML  SOLN
100.0000 mL | Freq: Once | INTRAMUSCULAR | Status: AC | PRN
  Administered 2015-03-26: 100 mL via INTRAVENOUS

## 2015-03-26 MED ORDER — ONDANSETRON HCL 4 MG/2ML IJ SOLN
4.0000 mg | Freq: Once | INTRAMUSCULAR | Status: AC
  Administered 2015-03-26: 4 mg via INTRAVENOUS
  Filled 2015-03-26: qty 2

## 2015-03-26 MED ORDER — ACETAMINOPHEN 325 MG PO TABS: 650.0000 mg | ORAL_TABLET | Freq: Four times a day (QID) | ORAL | Status: DC | PRN

## 2015-03-26 MED ORDER — ACETAMINOPHEN 325 MG PO TABS
650.0000 mg | ORAL_TABLET | Freq: Once | ORAL | Status: AC
  Administered 2015-03-26: 650 mg via ORAL
  Filled 2015-03-26: qty 2

## 2015-03-26 MED ORDER — PANTOPRAZOLE SODIUM 40 MG IV SOLR
40.0000 mg | Freq: Two times a day (BID) | INTRAVENOUS | Status: DC
  Administered 2015-03-26 – 2015-03-27 (×3): 40 mg via INTRAVENOUS
  Filled 2015-03-26 (×3): qty 40

## 2015-03-26 MED ORDER — SODIUM CHLORIDE 0.9 % IJ SOLN
3.0000 mL | Freq: Two times a day (BID) | INTRAMUSCULAR | Status: DC
  Administered 2015-03-27 (×2): 3 mL via INTRAVENOUS

## 2015-03-26 MED ORDER — DILTIAZEM LOAD VIA INFUSION
10.0000 mg | Freq: Once | INTRAVENOUS | Status: AC
  Administered 2015-03-26: 10 mg via INTRAVENOUS
  Filled 2015-03-26: qty 10

## 2015-03-26 NOTE — ED Notes (Signed)
Walked into room, patient diaphoretic. Performed EKG, given to MD. MD at bedside.

## 2015-03-26 NOTE — ED Provider Notes (Signed)
CSN: [id number]     Arrival date & time 03/26/15  1348 History   First MD Initiated Contact with Patient 03/26/15 1500     Chief Complaint  Patient presents with  . Vomiting  . Diarrhea     The history is provided by the patient. No language interpreter was used.   Jeremy Hayden for evaluation of vomiting and diarrhea. He reports 4 days of vomiting, diarrhea, abdominal pain. He reports multiple episodes of emesis daily, 2 days ago he vomited a large amount of blood. Since then he's only had yellow emesis. He reports 3-4 episodes of diarrhea daily. He describes the stool is dark in color. He has diffuse abdominal pain and chest pain that feels a sharp and stabbing sensation. The pain started yesterday and is waxing and waning. He has generalized fatigue, body aches, chills. He took 2 BC powders without improvement in his symptoms today. He has a history of prostate cancer and is followed at the [place] for this. Symptoms are severe, constant, worsening.  Past Medical History  Diagnosis Date  . Cancer     prostate  . Hypertension    Past Surgical History  Procedure Laterality Date  . Rotator cuff repair    . Neck surgery     History reviewed. No pertinent family history. Social History  Substance Use Topics  . Smoking status: Never Smoker   . Smokeless tobacco: None  . Alcohol Use: Yes     Comment: 40oz/day    Review of Systems  All other systems reviewed and are negative.     Allergies  Review of patient's allergies indicates no known allergies.  Home Medications   Prior to Admission medications   Medication Sig Start Date End Date Taking? Authorizing Provider  Aspirin-Salicylamide-Caffeine (BC HEADACHE POWDER PO) Take 1 packet by mouth daily as needed (headache/pain).   Yes Historical Provider, MD  lisinopril (PRINIVIL,ZESTRIL) 40 MG tablet Take 40 mg by mouth daily.   Yes Historical Provider, MD  omeprazole (PRILOSEC) 20 MG capsule Take 20 mg by mouth daily.   Yes Historical  Provider, MD  butalbital-acetaminophen-caffeine (FIORICET) 50-325-40 MG per tablet Take 1 tablet by mouth every 6 (six) hours as needed for headache. Patient not taking: Reported on 03/26/2015 04/22/14   Elmyra Ricks Pisciotta, PA-C  HYDROcodone-acetaminophen (NORCO/VICODIN) 5-325 MG per tablet Take 1 tablet by mouth every 6 (six) hours as needed for moderate pain or severe pain. Patient not taking: Reported on 03/26/2015 06/14/14   Starlyn Skeans, PA-C  predniSONE (DELTASONE) 10 MG tablet Day 1 take 6 pills Day 2 take 5 pills Day 3 take 4 pills Day 4 take 3 pills Day 5 take 2 pills Day 6 take 1 pill Patient not taking: Reported on 03/26/2015 06/14/14   Courtney Forcucci, PA-C   BP 131/95 mmHg  Pulse 120  Temp(Src) 98.6 F (37 C) (Oral)  Resp 17  SpO2 99% Physical Exam  Constitutional: He is oriented to person, place, and time. He appears well-developed and well-nourished.  HENT:  Head: Normocephalic and atraumatic.  Cardiovascular: Regular rhythm.   No murmur heard. Tachycardic  Pulmonary/Chest: Effort normal and breath sounds normal. No respiratory distress.  Abdominal: Soft. There is tenderness. There is no rebound and no guarding.  Moderate diffuse tenderness without guarding or rebound  Musculoskeletal: He exhibits no edema or tenderness.  Neurological: He is alert and oriented to person, place, and time.  Skin: Skin is warm and dry.  Psychiatric: He has a normal mood and affect. His  behavior is normal.  Nursing note and vitals reviewed.   ED Course  Procedures (including critical care time) CRITICAL CARE Performed by: Quintella Reichert   Total critical care time: 30 minutes  Critical care time was exclusive of separately billable procedures and treating other patients.  Critical care was necessary to treat or prevent imminent or life-threatening deterioration.  Critical care was time spent personally by me on the following activities: development of treatment plan with  patient and/or surrogate as well as nursing, discussions with consultants, evaluation of patient's response to treatment, examination of patient, obtaining history from patient or surrogate, ordering and performing treatments and interventions, ordering and review of laboratory studies, ordering and review of radiographic studies, pulse oximetry and re-evaluation of patient's condition.  Labs Review Labs Reviewed  COMPREHENSIVE METABOLIC PANEL - Abnormal; Notable for the following:    Sodium 133 (*)    Chloride 94 (*)    CO2 17 (*)    Creatinine, Ser 1.25 (*)    AST 93 (*)    Total Bilirubin 1.3 (*)    Anion gap 22 (*)    All other components within normal limits  CBC - Abnormal; Notable for the following:    Hemoglobin 18.3 (*)    MCH 36.0 (*)    MCHC 36.2 (*)    All other components within normal limits  URINALYSIS, ROUTINE W REFLEX MICROSCOPIC (NOT AT [facility]) - Abnormal; Notable for the following:    Hgb urine dipstick TRACE (*)    Bilirubin Urine SMALL (*)    Ketones, ur >80 (*)    Protein, ur 30 (*)    All other components within normal limits  URINE MICROSCOPIC-ADD ON - Abnormal; Notable for the following:    Casts HYALINE CASTS (*)    All other components within normal limits  CBC - Abnormal; Notable for the following:    WBC 3.6 (*)    Hemoglobin 17.2 (*)    MCV 100.8 (*)    MCH 35.0 (*)    All other components within normal limits  I-STAT CG4 LACTIC ACID, ED - Abnormal; Notable for the following:    Lactic Acid, Venous 2.07 (*)    All other components within normal limits  MRSA PCR SCREENING  STOOL CULTURE  C DIFFICILE QUICK SCREEN W PCR REFLEX  LIPASE, BLOOD  TROPONIN I  MAGNESIUM  PHOSPHORUS  TSH  COMPREHENSIVE METABOLIC PANEL  CBC  TROPONIN I  TROPONIN I  HEMOGLOBIN A1C  FECAL LACTOFERRIN  CBC  I-STAT TROPOININ, ED  I-STAT CG4 LACTIC ACID, ED  TYPE AND SCREEN    Imaging Review Ct Abdomen Pelvis W Contrast  03/26/2015   CLINICAL DATA:  Abdominal  pain. Chills. Fatigue and nausea. Symptoms for 4 days. Personal history of prostate cancer.  EXAM: CT ABDOMEN AND PELVIS WITH CONTRAST  TECHNIQUE: Multidetector CT imaging of the abdomen and pelvis was performed using the standard protocol following bolus administration of intravenous contrast.  CONTRAST:  195mL OMNIPAQUE IOHEXOL 300 MG/ML  SOLN  COMPARISON:  None.  FINDINGS: Musculoskeletal:  No aggressive osseous lesions.  Lung Bases: Dependent atelectasis.  Liver:  Hepatosteatosis.  Liver span is 15 cm.  Spleen:  Normal.  Gallbladder:  Normal.  Common bile duct:  Normal.  Pancreas:  Normal.  Adrenal glands:  Normal.  Kidneys: Normal renal enhancement. No renal calculi. LEFT ureter appears normal. RIGHT ureter is also normal. Normal delayed excretion of contrast.  Stomach: Moderate hiatal hernia. No inflammatory changes of stomach.  Small  bowel:  Normal duodenum.  Small bowel loops appear normal.  Colon: Normal appendix. Colonic diverticulosis. Negative for diverticulitis. Redundant sigmoid colon.  Pelvic Genitourinary: Prostatomegaly. Prostate impression on the bladder base.  Peritoneum: No free fluid or free air.  Vascular/lymphatic: Minimal atherosclerosis.  Body Wall: Normal.  IMPRESSION: 1. No acute abnormality. 2. Hepatosteatosis. 3. Moderate hiatal hernia.   Electronically Signed   By: Dereck Ligas M.D.   On: 03/26/2015 18:30   Dg Chest Port 1 View  03/26/2015   CLINICAL DATA:  History of 4 days of fatigue. Nausea vomiting and body aches with chills. Hypertension.  EXAM: PORTABLE CHEST - 1 VIEW  COMPARISON:  06/14/2014.  FINDINGS: The heart size and mediastinal contours are within normal limits. Both lungs are clear. The visualized skeletal structures are unremarkable.  IMPRESSION: No active disease.  Stable exam.   Electronically Signed   By: Staci Righter M.D.   On: 03/26/2015 15:30   I have personally reviewed and evaluated these images and lab results as part of my medical decision-making.    EKG Interpretation None      MDM   Final diagnoses:  Atrial fibrillation with RVR  Generalized abdominal pain    Patient here for evaluation of nausea, vomiting, abdominal pain. EKG with A. fib with RVR, no prior history of similar. Patient started off with Cardizem bolus of 10 mg followed by a drip. He had transient improvement in his heart rate from 140s to 120s followed by recurrent increased heart rate to 140s. She was given a second bolus of 15 mg and his heart rate improved to around 100. Discussed with medicine regarding admission for abdominal pain, vomiting, new onset A. fib with RVR.    Quintella Reichert, MD 03/27/15 [phone]

## 2015-03-26 NOTE — ED Notes (Signed)
HR increasing to 140's again.  MD notified and pain meds given.

## 2015-03-26 NOTE — ED Notes (Signed)
He c/o 4 days history fatigue, n/v/d, body aches, chills. He tried bc powder with no relief.

## 2015-03-26 NOTE — H&P (Signed)
[facility] [place]   Referring provider REESE   Chief Complaint:  Chills, nausea vomiting diarrhea    HPI: Jeremy Hayden is a 59 y.o. male   has a past medical history of Cancer and Hypertension.   Presented with 5 day of Nausea, vomiting, diarrhea, abdominal pain, Burning in the chest from repeated vomiting, HE endorses subjective fevers. He presented to ER due to ongoing Nausea and vomiting. In ER was noted to be in A.fib w RVR up to 151. He was started on diltiazem gtt. HR has come down to 9's but he continued to have episodes of tachycardia. CT abdomen unremarkable, Troponin WNL He reports specks of blood in vomit after extensive retching. 2 days ago he had some blood in his stool reports quite a bit. Blood was bright red covering the stool.    Hospitalist was called for admission for a.fib w rvr, hx of Gi bleed. Dehydration.   Review of Systems:    Pertinent positives include: Fevers, chills, chest pain, abdominal pain, nausea, vomiting, diarrhea,  Constitutional:  No weight loss, night sweats, fatigue, weight loss  HEENT:  No headaches, Difficulty swallowing,Tooth/dental problems,Sore throat,  No sneezing, itching, ear ache, nasal congestion, post nasal drip,  Cardio-vascular:  No  Orthopnea, PND, anasarca, dizziness, palpitations.no Bilateral lower extremity swelling  GI:  No heartburn, indigestion,  change in bowel habits, loss of appetite, melena, blood in stool, hematemesis Resp:  no shortness of breath at rest. No dyspnea on exertion, No excess mucus, no productive cough, No non-productive cough, No coughing up of blood.No change in color of mucus.No wheezing. Skin:  no rash or lesions. No jaundice GU:  no dysuria, change in color of urine, no urgency or frequency. No straining to urinate.  No flank pain.  Musculoskeletal:  No joint pain or no joint swelling. No decreased range of motion. No back pain.  Psych:  No change in mood or affect. No depression or  anxiety. No memory loss.  Neuro: no localizing neurological complaints, no tingling, no weakness, no double vision, no gait abnormality, no slurred speech, no confusion  Otherwise ROS are negative except for above, 10 systems were reviewed  Past Medical History: Past Medical History  Diagnosis Date  . Cancer     prostate  . Hypertension    Past Surgical History  Procedure Laterality Date  . Rotator cuff repair    . Neck surgery       Medications: Prior to Admission medications   Medication Sig Start Date End Date Taking? Authorizing Provider  amLODipine (NORVASC) 5 MG tablet Take 5 mg by mouth daily.   Yes Historical Provider, MD  Aspirin-Salicylamide-Caffeine (BC HEADACHE POWDER PO) Take 1 packet by mouth daily as needed (headache/pain).   Yes Historical Provider, MD  lisinopril (PRINIVIL,ZESTRIL) 40 MG tablet Take 40 mg by mouth daily.   Yes Historical Provider, MD  omeprazole (PRILOSEC) 20 MG capsule Take 20 mg by mouth daily.   Yes Historical Provider, MD  butalbital-acetaminophen-caffeine (FIORICET) 50-325-40 MG per tablet Take 1 tablet by mouth every 6 (six) hours as needed for headache. Patient not taking: Reported on 03/26/2015 04/22/14   Elmyra Ricks Pisciotta, PA-C  HYDROcodone-acetaminophen (NORCO/VICODIN) 5-325 MG per tablet Take 1 tablet by mouth every 6 (six) hours as needed for moderate pain or severe pain. Patient not taking: Reported on 03/26/2015 06/14/14   Loma Sousa Forcucci, PA-C  predniSONE (DELTASONE) 10 MG tablet Day 1 take 6 pills Day 2 take 5 pills Day 3 take  4 pills Day 4 take 3 pills Day 5 take 2 pills Day 6 take 1 pill Patient not taking: Reported on 03/26/2015 06/14/14   Starlyn Skeans, PA-C    Allergies:  No Known Allergies  Social History:  Ambulatory   Independently  Lives at home alone,         reports that he has never smoked. He does not have any smokeless tobacco history on file. He reports that he drinks alcohol. He reports that he does not  use illicit drugs.    Family History: family history includes Brain cancer in his sister; Cancer - Colon in his father; Ovarian cancer in his mother; Prostate cancer in his father; Sickle cell anemia in his brother.    Physical Exam: Patient Vitals for the past 24 hrs:  BP Temp Temp src Pulse Resp SpO2  03/26/15 1930 124/95 mmHg - - 97 - 97 %  03/26/15 1915 140/93 mmHg - - 91 17 97 %  03/26/15 1900 (!) 143/101 mmHg - - 104 13 99 %  03/26/15 1845 148/89 mmHg - - 108 16 99 %  03/26/15 1839 145/84 mmHg - - 113 22 99 %  03/26/15 1745 (!) 158/105 mmHg - - (!) 151 19 100 %  03/26/15 1730 (!) 159/105 mmHg - - 113 10 100 %  03/26/15 1715 163/98 mmHg - - (!) 121 17 100 %  03/26/15 1700 129/87 mmHg - - 115 16 98 %  03/26/15 1645 144/82 mmHg - - 111 15 98 %  03/26/15 1630 142/78 mmHg - - (!) 122 16 97 %  03/26/15 1615 - - - 112 - 98 %  03/26/15 1600 147/94 mmHg - - 110 - 97 %  03/26/15 1545 132/81 mmHg - - 109 - 98 %  03/26/15 1530 133/92 mmHg - - 111 - 99 %  03/26/15 1515 148/95 mmHg - - 114 - 99 %  03/26/15 1500 127/86 mmHg - - 110 - 99 %  03/26/15 1445 133/96 mmHg - - 116 - 100 %  03/26/15 1425 131/95 mmHg 98.6 F (37 C) Oral 120 17 99 %    1. General:  in No Acute distress 2. Psychological: Alert and   Oriented 3. Head/ENT:    Dry Mucous Membranes                          Head Non traumatic, neck supple                          Normal  Dentition 4. SKIN  decreased Skin turgor,  Skin clean Dry and intact no rash 5. Heart: Regular rate and rhythm no Murmur, Rub or gallop 6. Lungs: Clear to auscultation bilaterally, no wheezes or crackles   7. Abdomen: Soft, non-tender, Non distended 8. Lower extremities: no clubbing, cyanosis, or edema 9. Neurologically Grossly intact, moving all 4 extremities equally 10. MSK: Normal range of motion  body mass index is unknown because there is no weight on file.   Labs on Admission:   Results for orders placed or performed during the hospital  encounter of 03/26/15 (from the past 24 hour(s))  Urinalysis, Routine w reflex microscopic (not at [facility])     Status: Abnormal   Collection Time: 03/26/15  2:30 PM  Result Value Ref Range   Color, Urine YELLOW YELLOW   APPearance CLEAR CLEAR   Specific Gravity, Urine 1.018 1.005 - 1.030   pH  5.5 5.0 - 8.0   Glucose, UA NEGATIVE NEGATIVE mg/dL   Hgb urine dipstick TRACE (A) NEGATIVE   Bilirubin Urine SMALL (A) NEGATIVE   Ketones, ur >80 (A) NEGATIVE mg/dL   Protein, ur 30 (A) NEGATIVE mg/dL   Urobilinogen, UA 1.0 0.0 - 1.0 mg/dL   Nitrite NEGATIVE NEGATIVE   Leukocytes, UA NEGATIVE NEGATIVE  Urine microscopic-add on     Status: Abnormal   Collection Time: 03/26/15  2:30 PM  Result Value Ref Range   Casts HYALINE CASTS (A) NEGATIVE   Urine-Other MUCOUS PRESENT   Comprehensive metabolic panel     Status: Abnormal   Collection Time: 03/26/15  2:45 PM  Result Value Ref Range   Sodium 133 (L) 135 - 145 mmol/L   Potassium 4.4 3.5 - 5.1 mmol/L   Chloride 94 (L) 101 - 111 mmol/L   CO2 17 (L) 22 - 32 mmol/L   Glucose, Bld 79 65 - 99 mg/dL   BUN 7 6 - 20 mg/dL   Creatinine, Ser 1.25 (H) 0.61 - 1.24 mg/dL   Calcium 9.5 8.9 - 10.3 mg/dL   Total Protein 8.1 6.5 - 8.1 g/dL   Albumin 4.3 3.5 - 5.0 g/dL   AST 93 (H) 15 - 41 U/L   ALT 37 17 - 63 U/L   Alkaline Phosphatase 91 38 - 126 U/L   Total Bilirubin 1.3 (H) 0.3 - 1.2 mg/dL   GFR calc non Af Amer >60 >60 mL/min   GFR calc Af Amer >60 >60 mL/min   Anion gap 22 (H) 5 - 15  CBC     Status: Abnormal   Collection Time: 03/26/15  2:45 PM  Result Value Ref Range   WBC 4.1 4.0 - 10.5 K/uL   RBC 5.08 4.22 - 5.81 MIL/uL   Hemoglobin 18.3 (H) 13.0 - 17.0 g/dL   HCT 50.6 39.0 - 52.0 %   MCV 99.6 78.0 - 100.0 fL   MCH 36.0 (H) 26.0 - 34.0 pg   MCHC 36.2 (H) 30.0 - 36.0 g/dL   RDW 11.6 11.5 - 15.5 %   Platelets 179 150 - 400 K/uL  Type and screen     Status: None   Collection Time: 03/26/15  3:20 PM  Result Value Ref Range   ABO/RH(D) AB  POS    Antibody Screen NEG    Sample Expiration 03/29/2015   Lipase, blood     Status: None   Collection Time: 03/26/15  3:27 PM  Result Value Ref Range   Lipase 36 22 - 51 U/L  I-stat troponin, ED     Status: None   Collection Time: 03/26/15  3:30 PM  Result Value Ref Range   Troponin i, poc 0.00 0.00 - 0.08 ng/mL   Comment 3          I-Stat CG4 Lactic Acid, ED     Status: Abnormal   Collection Time: 03/26/15  3:33 PM  Result Value Ref Range   Lactic Acid, Venous 2.07 (HH) 0.5 - 2.0 mmol/L  I-Stat CG4 Lactic Acid, ED     Status: None   Collection Time: 03/26/15  7:05 PM  Result Value Ref Range   Lactic Acid, Venous 1.52 0.5 - 2.0 mmol/L    UA ketones >80  No results found for: HGBA1C  CrCl cannot be calculated (Unknown ideal weight.).  BNP (last 3 results) No results for input(s): PROBNP in the last 8760 hours.  Other results:  I have pearsonaly reviewed this:  ECG REPORT  Rate: 152   Rhythm: a.fib w RVR ST&T Change: QT 298 QTC 474   There were no vitals filed for this visit.   Cultures:    Component Value Date/Time   SDES URINE, CLEAN CATCH 11/12/2007 1100   SPECREQUEST NONE 11/12/2007 1100   CULT NO GROWTH 11/12/2007 1100   REPTSTATUS 11/13/2007 FINAL 11/12/2007 1100     Radiological Exams on Admission: Ct Abdomen Pelvis W Contrast  03/26/2015   CLINICAL DATA:  Abdominal pain. Chills. Fatigue and nausea. Symptoms for 4 days. Personal history of prostate cancer.  EXAM: CT ABDOMEN AND PELVIS WITH CONTRAST  TECHNIQUE: Multidetector CT imaging of the abdomen and pelvis was performed using the standard protocol following bolus administration of intravenous contrast.  CONTRAST:  18mL OMNIPAQUE IOHEXOL 300 MG/ML  SOLN  COMPARISON:  None.  FINDINGS: Musculoskeletal:  No aggressive osseous lesions.  Lung Bases: Dependent atelectasis.  Liver:  Hepatosteatosis.  Liver span is 15 cm.  Spleen:  Normal.  Gallbladder:  Normal.  Common bile duct:  Normal.  Pancreas:  Normal.   Adrenal glands:  Normal.  Kidneys: Normal renal enhancement. No renal calculi. LEFT ureter appears normal. RIGHT ureter is also normal. Normal delayed excretion of contrast.  Stomach: Moderate hiatal hernia. No inflammatory changes of stomach.  Small bowel:  Normal duodenum.  Small bowel loops appear normal.  Colon: Normal appendix. Colonic diverticulosis. Negative for diverticulitis. Redundant sigmoid colon.  Pelvic Genitourinary: Prostatomegaly. Prostate impression on the bladder base.  Peritoneum: No free fluid or free air.  Vascular/lymphatic: Minimal atherosclerosis.  Body Wall: Normal.  IMPRESSION: 1. No acute abnormality. 2. Hepatosteatosis. 3. Moderate hiatal hernia.   Electronically Signed   By: Dereck Ligas M.D.   On: 03/26/2015 18:30   Dg Chest Port 1 View  03/26/2015   CLINICAL DATA:  History of 4 days of fatigue. Nausea vomiting and body aches with chills. Hypertension.  EXAM: PORTABLE CHEST - 1 VIEW  COMPARISON:  06/14/2014.  FINDINGS: The heart size and mediastinal contours are within normal limits. Both lungs are clear. The visualized skeletal structures are unremarkable.  IMPRESSION: No active disease.  Stable exam.   Electronically Signed   By: Staci Righter M.D.   On: 03/26/2015 15:30    Chart has been reviewed  Family not  at  Bedside   Assessment/Plan  59 year old male with history of hypertension presents with nausea vomiting diarrhea for the past 5 days with evidence of dehydration was found to be in atrial fibrillation with RVR. Also reports blood in stool 2 days ago but has currently resolved. Currently on diltiazem drip admitted to step down  Present on Admission:  . Atrial fibrillation with RVR -  will order echo gram, cycle cardiac enzymes, check TSH, most likely secondary to dehydration continue diltiazem drip for now then convert to by mouth once able to tolerate. Currently still nauseous. No candidate for anticoagulation at this point given recent blood in stool.  .  Dehydration will rehydrate  . Hypertension hold Norvasc for now to allow and blood pressure to continue the diltiazem drip  . Chest pain atypical we will cycle cardiac enzymes  . Gastroenteritis - most likely viral but will obtain stool cultures Blood in stool. Patient states that currently resolved. CT scan of abdomen unremarkable. We'll make sure he is on Protonix although most likely this is a lower source. GI consult in the morning   Prophylaxis: SCD   CODE STATUS:  FULL CODE  as per patient  Disposition:   To home once workup is complete and patient is stable  Other plan as per orders.  I have spent a total of 55 min on this admission  Desirie Minteer 03/26/2015, 7:50 PM  [facility]  Pager [phone]   after 2 AM please page floor coverage PA If 7AM-7PM, please contact the day team taking care of the patient  [email or web address]  Password [email or web address]

## 2015-03-27 ENCOUNTER — Encounter (HOSPITAL_COMMUNITY): Payer: Self-pay | Admitting: Physician Assistant

## 2015-03-27 ENCOUNTER — Inpatient Hospital Stay (HOSPITAL_COMMUNITY): Payer: Non-veteran care

## 2015-03-27 ENCOUNTER — Encounter (HOSPITAL_COMMUNITY)
Admission: EM | Disposition: A | Discharge status: Home / Self Care | Payer: Self-pay | Source: Home / Self Care | Attending: Internal Medicine

## 2015-03-27 DIAGNOSIS — K920 Hematemesis: Secondary | ICD-10-CM | POA: Diagnosis present

## 2015-03-27 DIAGNOSIS — I4891 Unspecified atrial fibrillation: Secondary | ICD-10-CM

## 2015-03-27 DIAGNOSIS — R079 Chest pain, unspecified: Secondary | ICD-10-CM

## 2015-03-27 DIAGNOSIS — R110 Nausea: Secondary | ICD-10-CM

## 2015-03-27 DIAGNOSIS — K529 Noninfective gastroenteritis and colitis, unspecified: Secondary | ICD-10-CM

## 2015-03-27 HISTORY — PX: ESOPHAGOGASTRODUODENOSCOPY: SHX5428

## 2015-03-27 LAB — COMPREHENSIVE METABOLIC PANEL
ALBUMIN: 3.2 g/dL — AB (ref 3.5–5.0)
ALT: 28 U/L (ref 17–63)
AST: 63 U/L — AB (ref 15–41)
Alkaline Phosphatase: 69 U/L (ref 38–126)
Anion gap: 14 (ref 5–15)
CHLORIDE: 104 mmol/L (ref 101–111)
CO2: 17 mmol/L — AB (ref 22–32)
CREATININE: 1.1 mg/dL (ref 0.61–1.24)
Calcium: 8.3 mg/dL — ABNORMAL LOW (ref 8.9–10.3)
GFR calc Af Amer: >60 mL/min (ref >60)
GFR calc non Af Amer: >60 mL/min (ref >60)
Glucose, Bld: 86 mg/dL (ref 65–99)
Potassium: 4.5 mmol/L (ref 3.5–5.1)
SODIUM: 135 mmol/L (ref 135–145)
Total Bilirubin: 1.6 mg/dL — ABNORMAL HIGH (ref 0.3–1.2)
Total Protein: 6.4 g/dL — ABNORMAL LOW (ref 6.5–8.1)

## 2015-03-27 LAB — FERRITIN: FERRITIN: 394 ng/mL — AB (ref 24–336)

## 2015-03-27 LAB — CBC
HCT: 44 % (ref 39.0–52.0)
HEMATOCRIT: 49.5 % (ref 39.0–52.0)
Hemoglobin: 17.2 g/dL — ABNORMAL HIGH (ref 13.0–17.0)
Hemoglobin: 15.5 g/dL (ref 13.0–17.0)
MCH: 35 pg — ABNORMAL HIGH (ref 26.0–34.0)
MCH: 35 pg — AB (ref 26.0–34.0)
MCHC: 34.7 g/dL (ref 30.0–36.0)
MCHC: 35.2 g/dL (ref 30.0–36.0)
MCV: 100.8 fL — ABNORMAL HIGH (ref 78.0–100.0)
MCV: 99.3 fL (ref 78.0–100.0)
PLATELETS: 148 10*3/uL — AB (ref 150–400)
Platelets: 153 10*3/uL (ref 150–400)
RBC: 4.91 MIL/uL (ref 4.22–5.81)
RBC: 4.43 MIL/uL (ref 4.22–5.81)
RDW: 11.6 % (ref 11.5–15.5)
RDW: 11.6 % (ref 11.5–15.5)
WBC: 3.6 10*3/uL — ABNORMAL LOW (ref 4.0–10.5)
WBC: 3.4 10*3/uL — ABNORMAL LOW (ref 4.0–10.5)

## 2015-03-27 LAB — TROPONIN I
Troponin I: <0.03 ng/mL (ref <0.031)
Troponin I: <0.03 ng/mL (ref <0.031)

## 2015-03-27 LAB — MAGNESIUM: Magnesium: 2.3 mg/dL (ref 1.7–2.4)

## 2015-03-27 LAB — PHOSPHORUS: Phosphorus: 2.4 mg/dL — ABNORMAL LOW (ref 2.5–4.6)

## 2015-03-27 LAB — IRON AND TIBC
IRON: 80 ug/dL (ref 45–182)
Saturation Ratios: 25 % (ref 17.9–39.5)
TIBC: 316 ug/dL (ref 250–450)
UIBC: 236 ug/dL

## 2015-03-27 LAB — TSH: TSH: 2.538 u[IU]/mL (ref 0.350–4.500)

## 2015-03-27 SURGERY — EGD (ESOPHAGOGASTRODUODENOSCOPY)
Anesthesia: Moderate Sedation

## 2015-03-27 MED ORDER — HYDRALAZINE HCL 20 MG/ML IJ SOLN
5.0000 mg | Freq: Once | INTRAMUSCULAR | Status: AC
  Administered 2015-03-27: 5 mg via INTRAVENOUS
  Filled 2015-03-27: qty 1

## 2015-03-27 MED ORDER — DIPHENHYDRAMINE HCL 50 MG/ML IJ SOLN
INTRAMUSCULAR | Status: AC
  Filled 2015-03-27: qty 1

## 2015-03-27 MED ORDER — METOPROLOL TARTRATE 25 MG PO TABS
25.0000 mg | ORAL_TABLET | Freq: Two times a day (BID) | ORAL | Status: DC
  Administered 2015-03-27 – 2015-03-28 (×3): 25 mg via ORAL
  Filled 2015-03-27 (×3): qty 1

## 2015-03-27 MED ORDER — MIDAZOLAM HCL 10 MG/2ML IJ SOLN
INTRAMUSCULAR | Status: DC | PRN
  Administered 2015-03-27 (×3): 2 mg via INTRAVENOUS
  Administered 2015-03-27: 1 mg via INTRAVENOUS

## 2015-03-27 MED ORDER — FENTANYL CITRATE (PF) 100 MCG/2ML IJ SOLN
INTRAMUSCULAR | Status: DC | PRN
  Administered 2015-03-27 (×4): 25 ug via INTRAVENOUS

## 2015-03-27 MED ORDER — BUTAMBEN-TETRACAINE-BENZOCAINE 2-2-14 % EX AERO
INHALATION_SPRAY | CUTANEOUS | Status: DC | PRN
  Administered 2015-03-27: 2 via TOPICAL

## 2015-03-27 MED ORDER — MIDAZOLAM HCL 5 MG/ML IJ SOLN
INTRAMUSCULAR | Status: AC
Start: 2015-03-27 — End: 2015-03-27
  Filled 2015-03-27: qty 2

## 2015-03-27 MED ORDER — FENTANYL CITRATE (PF) 100 MCG/2ML IJ SOLN
INTRAMUSCULAR | Status: AC
  Filled 2015-03-27: qty 2

## 2015-03-27 MED ORDER — SODIUM CHLORIDE 0.9 % IV SOLN
INTRAVENOUS | Status: DC
Start: 2015-03-27 — End: 2015-03-27
  Administered 2015-03-27: 500 mL via INTRAVENOUS

## 2015-03-27 NOTE — Interval H&P Note (Signed)
History and Physical Interval Note:  03/27/2015 3:23 PM  Jeremy Hayden  has presented today for surgery, with the diagnosis of hematemesis and epigastric pain  The various methods of treatment have been discussed with the patient and family. After consideration of risks, benefits and other options for treatment, the patient has consented to  Procedure(s): ESOPHAGOGASTRODUODENOSCOPY (EGD) (N/A) as a surgical intervention .  The patient's history has been reviewed, patient examined, no change in status, stable for surgery.  I have reviewed the patient's chart and labs.  Questions were answered to the patient's satisfaction.     Renelda Loma Armbruster

## 2015-03-27 NOTE — Consult Note (Signed)
[facility] Consult: 12:37 PM 03/27/2015  LOS: 1 day    Referring Provider: Dr Bonnielee Haff  Primary Care Physician:  Dr. Jeannetta Nap at the [facility] Primary Gastroenterologist:  Althia Forts, GI service at [facility].       Reason for Consultation:  Hematemesis, abd pain, hx duodenal ulcer.    HPI: Jeremy Hayden is a 59 y.o. male.  Hx GERD.  Prostate cancer, conservative mgt. 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  Hx heart arrhythmia dating to before 2006 (exact rhythm not defined).   Since one week ago, Monday, the patient's been vomiting and having epigastric pain. On Monday he started taking 8 goodies powders daily because of the epigastric pain; prior to that he used one Goody's per day.  By Thursday he was having blood in his emesis. Emesis itself was both bilious and dark at times. He was having ongoing issues with his chronic bleeding per rectum. This is bleeding that is mixed with stools but sometimes he just passes blood. Stools have been dark, not melenic though, and yellow and some are loose since last week.  He does not have intestinal pain. Baseline appetite is good.  No heart burn, takes OTC Prilosec.  Patient presented to the emergency room yesterday. Heart rate was Afib with RVR to 150s. Treated with Cardizem IV and is now in NSR. Normal troponins. Do not yet see a cardiology consultation on the chart.  2-D echocardiogram shows ejection fraction 55-60% , no valvular or wall motion abnormalities. His hemoglobin initially was 18.3, it has dropped to 15.5.  MCV 99. He has a leukocytosis which was present as far back as labs go which is 12/2009. Pro time and INR are normal. BUN is not elevated, however his creatinine is slightly elevated at 1.25. AST elevated at 93, total bilirubin  elevated at 1.3. Lipase, ALT, alkaline phosphatase are normal. CT scan of the abdomen pelvis shows hepatic steatosis with a liver span of 15 cm. There is a moderate sized hiatal hernia. Contrasted imaging of the intestinal tract showed no evidence for any small bowel or colonic problems other than colonic diverticulosis.  Patient says he drinks beer, a single can, about 3 times a week. He's never had problems with anemia before and has never required transfusion. He's never been told he had internal bleeding though there was some sort of vague bleeding back when he was told he had ulcers in the 1970s. Treatment for this consisted of drinking goat's milk.  He never has had upper endoscopy, just the colonoscopy at the [place] in 2014 at which time he says he had cancer diagnosed but they just wanted to watch it. They advised him he would need repeat colonoscopy in 2-3 years. Suspect that the patient had adenomatous polyp, and not cancer as surgical consultation was never sought.  Normally patient moves his bowels twice a week. As for the patient reported history of prostate cancer: Patient's PMD had said they were just going to follow him every 6 months for this. He has yet to receive  any chemotherapy or radiation. Plans were to "freeze" the cancer if necessary.  Patient also denies recent use of any prednisone, it was listed on his med list but he hasn't been taking this.    Past Medical History  Diagnosis Date  . Cancer     prostate  . Hypertension   . GERD (gastroesophageal reflux disease)     Past Surgical History  Procedure Laterality Date  . Rotator cuff repair Right 2003    with partial acromiectomy/plasty  . Neck surgery  11/2001    multilevel c spine diskectomy, decompression, arthrodesis with bone graft and plate placed.  Dr Rennis Harding    Prior to Admission medications   Medication Sig Start Date End Date Taking? Authorizing Provider  amLODipine (NORVASC) 5 MG tablet Take 5 mg by mouth  daily.   Yes Historical Provider, MD  Aspirin-Salicylamide-Caffeine (BC HEADACHE POWDER PO) Take 1 packet by mouth daily as needed (headache/pain).   Yes Historical Provider, MD  lisinopril (PRINIVIL,ZESTRIL) 40 MG tablet Take 40 mg by mouth daily.   Yes Historical Provider, MD  omeprazole (PRILOSEC) 20 MG capsule Take 20 mg by mouth daily.   Yes Historical Provider, MD  butalbital-acetaminophen-caffeine (FIORICET) 50-325-40 MG per tablet Take 1 tablet by mouth every 6 (six) hours as needed for headache. Patient not taking: Reported on 03/26/2015 04/22/14   Elmyra Ricks Pisciotta, PA-C  HYDROcodone-acetaminophen (NORCO/VICODIN) 5-325 MG per tablet Take 1 tablet by mouth every 6 (six) hours as needed for moderate pain or severe pain. Patient not taking: Reported on 03/26/2015 06/14/14   Starlyn Skeans, PA-C  predniSONE (DELTASONE) 10 MG tablet Day 1 take 6 pills Day 2 take 5 pills Day 3 take 4 pills Day 4 take 3 pills Day 5 take 2 pills Day 6 take 1 pill Patient not taking: Reported on 03/26/2015 06/14/14   Starlyn Skeans, PA-C    Scheduled Meds: . metoprolol tartrate  25 mg Oral BID  . pantoprazole (PROTONIX) IV  40 mg Intravenous Q12H  . sodium chloride  3 mL Intravenous Q12H   Infusions:   PRN Meds: acetaminophen **OR** acetaminophen, HYDROcodone-acetaminophen, ondansetron **OR** ondansetron (ZOFRAN) IV   Allergies as of 03/26/2015  . (No Known Allergies)    Family History  Problem Relation Age of Onset  . Ovarian cancer Mother   . Cancer - Colon Father   . Prostate cancer Father   . Brain cancer Sister   . Sickle cell anemia Brother     Social History   Social History  . Marital Status: Legally Separated    Spouse Name: N/A  . Number of Children: N/A  . Years of Education: N/A   Occupational History  . Not on file.   Social History Main Topics  . Smoking status: Never Smoker   . Smokeless tobacco: Not on file  . Alcohol Use: Yes     Comment:  3 40oz/week  . Drug  Use: No  . Sexual Activity: Not on file   Other Topics Concern  . Not on file   Social History Narrative   As of September, 2016 the patient works for a [employer]. 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 It is not particularly exertional work but does require some physical strength.   Patient has been separated from his wife since 72.    REVIEW OF SYSTEMS: Constitutional:  Stable weight. Able to perform his job 40 hours per week. ENT:  No nose  bleeds Pulm:  Some shortness of breath, no cough CV:  No palpitations, no LE edema.  GU:  No hematuria, no frequency GI:  No dysphagia.  See HPI Heme:  No issues with excessive bleeding or bruising.   Transfusions:  Has never received blood transfusions before. Neuro:  No headaches, no peripheral tingling or numbness Derm:  No itching, no rash or sores.  Endocrine:  No sweats or chills.  No polyuria or dysuria Immunization:  Did not inquire. Travel:  None beyond local counties in last few months.    PHYSICAL EXAM: Vital signs in last 24 hours: Filed Vitals:   03/27/15 1138  BP: 144/105  Pulse: 81  Temp: 98.9 F (37.2 C)  Resp:    Wt Readings from Last 3 Encounters:  03/26/15 138 lb 0.1 oz (62.6 kg)  06/14/14 170 lb (77.111 kg)  04/22/14 160 lb (72.576 kg)    General: Pleasant, looks well. Comfortable. Head:  No swelling, no asymmetry. No signs of head trauma.  Eyes:  No scleral icterus, no conjunctival pallor. Ears:  Not hard of hearing  Nose:  No congestion or discharge Mouth:  Clear, moist. Dentition in good repair. Neck:  No JVD, no masses, no thyromegaly. Lungs:  Clear to auscultation and percussion bilaterally. Heart: RRR. No MRG. S1/S2 audible. Abdomen:  Soft.NT.  ND.   No masses. Positive hepatomegaly but liver not tender. Minimal tenderness if any in the epigastrium. Bowel sounds active..   Rectal: Prostate enlarged but smooth. No palpable or visible  hemorrhoids. Stool is medium brown/orangeish but FOBT negative   Musc/Skeltl: No spinal or limb deformities. Extremities:  No CCE.  Neurologic:  Oriented 3. No tremor, no asterixis. Alert. No gross deficits. Skin:  No rash, sores or telangiectasia Tattoos:  None seen Nodes:  No cervical adenopathy.   Psych:  Cooperative, pleasant, relaxed.  Intake/Output from previous day: 09/11 0701 - 09/12 0700 In: 3790 [P.O.:240; I.V.:955] Out: 700 [Urine:700] Intake/Output this shift: Total I/O In: 281 [P.O.:60; I.V.:221] Out: 530 [Urine:530]  LAB RESULTS:  Recent Labs  03/26/15 1445 03/26/15 2310 03/27/15 0406  WBC 4.1 3.6* 3.4*  HGB 18.3* 17.2* 15.5  HCT 50.6 49.5 44.0  PLT 179 153 148*   BMET Lab Results  Component Value Date   NA 135 03/27/2015   NA 133* 03/26/2015   NA 145 06/14/2014   K 4.5 03/27/2015   K 4.4 03/26/2015   K 4.3 06/14/2014   CL 104 03/27/2015   CL 94* 03/26/2015   CL 108 06/14/2014   CO2 17* 03/27/2015   CO2 17* 03/26/2015   CO2 25 06/14/2014   GLUCOSE 86 03/27/2015   GLUCOSE 79 03/26/2015   GLUCOSE 95 06/14/2014   BUN <5* 03/27/2015   BUN 7 03/26/2015   BUN 13 06/14/2014   CREATININE 1.10 03/27/2015   CREATININE 1.25* 03/26/2015   CREATININE 0.94 06/14/2014   CALCIUM 8.3* 03/27/2015   CALCIUM 9.5 03/26/2015   CALCIUM 8.7 06/14/2014   LFT  Recent Labs  03/26/15 1445 03/27/15 0406  PROT 8.1 6.4*  ALBUMIN 4.3 3.2*  AST 93* 63*  ALT 37 28  ALKPHOS 91 69  BILITOT 1.3* 1.6*   PT/INR Lab Results  Component Value Date   INR 1.10 04/22/2014   INR 1.04 12/25/2009   Hepatitis Panel No results for input(s): HEPBSAG, HCVAB, HEPAIGM, HEPBIGM in the last 72 hours. C-Diff No components found for: CDIFF Lipase     Component Value Date/Time   LIPASE 36 03/26/2015 1527  Drugs of Abuse  No results found for: LABOPIA, COCAINSCRNUR, LABBENZ, AMPHETMU, THCU, LABBARB   RADIOLOGY STUDIES: Ct Abdomen Pelvis W Contrast  03/26/2015    CLINICAL DATA:  Abdominal pain. Chills. Fatigue and nausea. Symptoms for 4 days. Personal history of prostate cancer.  EXAM: CT ABDOMEN AND PELVIS WITH CONTRAST  TECHNIQUE: Multidetector CT imaging of the abdomen and pelvis was performed using the standard protocol following bolus administration of intravenous contrast.  CONTRAST:  124mL OMNIPAQUE IOHEXOL 300 MG/ML  SOLN  COMPARISON:  None.  FINDINGS: Musculoskeletal:  No aggressive osseous lesions.  Lung Bases: Dependent atelectasis.  Liver:  Hepatosteatosis.  Liver span is 15 cm.  Spleen:  Normal.  Gallbladder:  Normal.  Common bile duct:  Normal.  Pancreas:  Normal.  Adrenal glands:  Normal.  Kidneys: Normal renal enhancement. No renal calculi. LEFT ureter appears normal. RIGHT ureter is also normal. Normal delayed excretion of contrast.  Stomach: Moderate hiatal hernia. No inflammatory changes of stomach.  Small bowel:  Normal duodenum.  Small bowel loops appear normal.  Colon: Normal appendix. Colonic diverticulosis. Negative for diverticulitis. Redundant sigmoid colon.  Pelvic Genitourinary: Prostatomegaly. Prostate impression on the bladder base.  Peritoneum: No free fluid or free air.  Vascular/lymphatic: Minimal atherosclerosis.  Body Wall: Normal.  IMPRESSION: 1. No acute abnormality. 2. Hepatosteatosis. 3. Moderate hiatal hernia.   Electronically Signed   By: Dereck Ligas M.D.   On: 03/26/2015 18:30   Dg Chest Port 1 View  03/26/2015   CLINICAL DATA:  History of 4 days of fatigue. Nausea vomiting and body aches with chills. Hypertension.  EXAM: PORTABLE CHEST - 1 VIEW  COMPARISON:  06/14/2014.  FINDINGS: The heart size and mediastinal contours are within normal limits. Both lungs are clear. The visualized skeletal structures are unremarkable.  IMPRESSION: No active disease.  Stable exam.   Electronically Signed   By: Staci Righter M.D.   On: 03/26/2015 15:30    ENDOSCOPIC STUDIES: Colonoscopy at the [place] in 2014.  Sounds like he had  adenomatous colon polyps. Possibly had diverticulosis. What he says is he had a cancerous growth but surgery was never a consideration so he was probably told that he had a "precancerous" polyp .  Was advised he would need repeat study in 2-3 years.  IMPRESSION:   *  Hematemesis, epigastric pain. Hematemesis began following a number of incidences of nonbloody emesis. Suspect Mallory-Weiss tear. Rule out ulcer disease. Patient reported history of ulcer disease in the 1970s but never underwent any sort of diagnostic studies for this. Long history GERD which is normally fairly well controlled with over the counter omeprazole.  *  Bleeding per rectum. This has been a problem before several years. Colonoscopy in 2014. Sounds like he had a precancerous, adenomatous polyp. Diverticulosis may also have been seen. Question intermittent diverticular bleeds, question hemorrhoidal bleeding as he has had hemorrhoids in the past.  *  Rapid atrial fibrillation.  Converted back to normal sinus rhythm after Cardizem IV.  Cardizem has been discontinued and oral Lopressor being initiated for hypertension.  CHADS Vasc score of 1 so will not need anticoagulation.   *  Fatty liver with elevation of AST and T bili. Patient self report is of moderate ETOH, 3 cans of beer per week.   *  Hypertension.  On 3 BP meds at home.   *  Prostate cancer, being followed and managed conservatively by M.D. at [facility]     PLAN:     *  Needs at least an EGD.  Will be done this afternoon. Whether or not he needs a colonoscopy now or can be referred back to the [facility] for follow-up of chronic rectal bleeding needs to be determined.  Since anticoagulation not required, no urgency to work up chronic rectal bleeding which was present [facility].   *  I doubt the patient has infectious colitis given the CT scan. The diarrhea has been associated with an acute GI illness. Not sure it's necessary to check a fecal  lactoferrin.  *  We will check hepatitis B and C serologies just to rule out chronic viral hepatitis.    Azucena Freed  03/27/2015, 12:37 PM Pager: [phone]    Attending Addendum: I have taken an interval history, reviewed the chart, and examined the patient. I agree with the Advanced Practitioner's note and impression. Patient with epigastric pain/vomiting in the setting of NSAID use. H/H stable. Discussed differential with patient and need for EGD to clarify source of pain and bleeding. We will rule out PUD, assess for H pylori. Mallory Weiss tear is also possible. Continue PPI in the interim, no NSAIDs. Further recommendations pending the result. Of note, AST/ALT elevation noted, suspect fatty liver disease based on CT. Will need to discuss alcohol use with him as this could be the etiology but recommend basic labs for chronic liver diseases to ensure negative if he has not had them done.   Chacra Cellar, MD [facility] Pager [phone]

## 2015-03-27 NOTE — Care Management Note (Signed)
Case Management Note  Patient Details  Name: Jeremy Hayden MRN: [id number] Date of Birth: 05/08/1956  Subjective/Objective:       Adm w at fib             Action/Plan: lives w fam   Expected Discharge Date:                 Expected Discharge Plan:     In-House Referral:     Discharge planning Services     Post Acute Care Choice:    Choice offered to:     DME Arranged:    DME Agency:     HH Arranged:    [place] Agency:     Status of Service:     Medicare Important Message Given:    Date Medicare IM Given:    Medicare IM give by:    Date Additional Medicare IM Given:    Additional Medicare Important Message give by:     If discussed at [place] of Stay Meetings, dates discussed:    Additional Comments: ur review done  Lacretia Leigh, RN 03/27/2015, 9:47 AM

## 2015-03-27 NOTE — H&P (View-Only) (Signed)
[facility] Consult: 12:37 PM 03/27/2015  LOS: 1 day    Referring Provider: Dr Bonnielee Haff  Primary Care Physician:  Dr. Jeannetta Nap at the [facility] Primary Gastroenterologist:  Althia Forts, GI service at [facility].       Reason for Consultation:  Hematemesis, abd pain, hx duodenal ulcer.    HPI: Jeremy Hayden is a 59 y.o. male.  Hx GERD.  Prostate cancer, conservative mgt. 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  Hx heart arrhythmia dating to before 2006 (exact rhythm not defined).   Since one week ago, Monday, the patient's been vomiting and having epigastric pain. On Monday he started taking 8 goodies powders daily because of the epigastric pain; prior to that he used one Goody's per day.  By Thursday he was having blood in his emesis. Emesis itself was both bilious and dark at times. He was having ongoing issues with his chronic bleeding per rectum. This is bleeding that is mixed with stools but sometimes he just passes blood. Stools have been dark, not melenic though, and yellow and some are loose since last week.  He does not have intestinal pain. Baseline appetite is good.  No heart burn, takes OTC Prilosec.  Patient presented to the emergency room yesterday. Heart rate was Afib with RVR to 150s. Treated with Cardizem IV and is now in NSR. Normal troponins. Do not yet see a cardiology consultation on the chart.  2-D echocardiogram shows ejection fraction 55-60% , no valvular or wall motion abnormalities. His hemoglobin initially was 18.3, it has dropped to 15.5.  MCV 99. He has a leukocytosis which was present as far back as labs go which is 12/2009. Pro time and INR are normal. BUN is not elevated, however his creatinine is slightly elevated at 1.25. AST elevated at 93, total bilirubin  elevated at 1.3. Lipase, ALT, alkaline phosphatase are normal. CT scan of the abdomen pelvis shows hepatic steatosis with a liver span of 15 cm. There is a moderate sized hiatal hernia. Contrasted imaging of the intestinal tract showed no evidence for any small bowel or colonic problems other than colonic diverticulosis.  Patient says he drinks beer, a single can, about 3 times a week. He's never had problems with anemia before and has never required transfusion. He's never been told he had internal bleeding though there was some sort of vague bleeding back when he was told he had ulcers in the 1970s. Treatment for this consisted of drinking goat's milk.  He never has had upper endoscopy, just the colonoscopy at the [place] in 2014 at which time he says he had cancer diagnosed but they just wanted to watch it. They advised him he would need repeat colonoscopy in 2-3 years. Suspect that the patient had adenomatous polyp, and not cancer as surgical consultation was never sought.  Normally patient moves his bowels twice a week. As for the patient reported history of prostate cancer: Patient's PMD had said they were just going to follow him every 6 months for this. He has yet to receive  any chemotherapy or radiation. Plans were to "freeze" the cancer if necessary.  Patient also denies recent use of any prednisone, it was listed on his med list but he hasn't been taking this.    Past Medical History  Diagnosis Date  . Cancer     prostate  . Hypertension   . GERD (gastroesophageal reflux disease)     Past Surgical History  Procedure Laterality Date  . Rotator cuff repair Right 2003    with partial acromiectomy/plasty  . Neck surgery  11/2001    multilevel c spine diskectomy, decompression, arthrodesis with bone graft and plate placed.  Dr Rennis Harding    Prior to Admission medications   Medication Sig Start Date End Date Taking? Authorizing Provider  amLODipine (NORVASC) 5 MG tablet Take 5 mg by mouth  daily.   Yes Historical Provider, MD  Aspirin-Salicylamide-Caffeine (BC HEADACHE POWDER PO) Take 1 packet by mouth daily as needed (headache/pain).   Yes Historical Provider, MD  lisinopril (PRINIVIL,ZESTRIL) 40 MG tablet Take 40 mg by mouth daily.   Yes Historical Provider, MD  omeprazole (PRILOSEC) 20 MG capsule Take 20 mg by mouth daily.   Yes Historical Provider, MD  butalbital-acetaminophen-caffeine (FIORICET) 50-325-40 MG per tablet Take 1 tablet by mouth every 6 (six) hours as needed for headache. Patient not taking: Reported on 03/26/2015 04/22/14   Elmyra Ricks Pisciotta, PA-C  HYDROcodone-acetaminophen (NORCO/VICODIN) 5-325 MG per tablet Take 1 tablet by mouth every 6 (six) hours as needed for moderate pain or severe pain. Patient not taking: Reported on 03/26/2015 06/14/14   Starlyn Skeans, PA-C  predniSONE (DELTASONE) 10 MG tablet Day 1 take 6 pills Day 2 take 5 pills Day 3 take 4 pills Day 4 take 3 pills Day 5 take 2 pills Day 6 take 1 pill Patient not taking: Reported on 03/26/2015 06/14/14   Starlyn Skeans, PA-C    Scheduled Meds: . metoprolol tartrate  25 mg Oral BID  . pantoprazole (PROTONIX) IV  40 mg Intravenous Q12H  . sodium chloride  3 mL Intravenous Q12H   Infusions:   PRN Meds: acetaminophen **OR** acetaminophen, HYDROcodone-acetaminophen, ondansetron **OR** ondansetron (ZOFRAN) IV   Allergies as of 03/26/2015  . (No Known Allergies)    Family History  Problem Relation Age of Onset  . Ovarian cancer Mother   . Cancer - Colon Father   . Prostate cancer Father   . Brain cancer Sister   . Sickle cell anemia Brother     Social History   Social History  . Marital Status: Legally Separated    Spouse Name: N/A  . Number of Children: N/A  . Years of Education: N/A   Occupational History  . Not on file.   Social History Main Topics  . Smoking status: Never Smoker   . Smokeless tobacco: Not on file  . Alcohol Use: Yes     Comment:  3 40oz/week  . Drug  Use: No  . Sexual Activity: Not on file   Other Topics Concern  . Not on file   Social History Narrative   As of September, 2016 the patient works for a [employer]. 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 It is not particularly exertional work but does require some physical strength.   Patient has been separated from his wife since 33.    REVIEW OF SYSTEMS: Constitutional:  Stable weight. Able to perform his job 40 hours per week. ENT:  No nose  bleeds Pulm:  Some shortness of breath, no cough CV:  No palpitations, no LE edema.  GU:  No hematuria, no frequency GI:  No dysphagia.  See HPI Heme:  No issues with excessive bleeding or bruising.   Transfusions:  Has never received blood transfusions before. Neuro:  No headaches, no peripheral tingling or numbness Derm:  No itching, no rash or sores.  Endocrine:  No sweats or chills.  No polyuria or dysuria Immunization:  Did not inquire. Travel:  None beyond local counties in last few months.    PHYSICAL EXAM: Vital signs in last 24 hours: Filed Vitals:   03/27/15 1138  BP: 144/105  Pulse: 81  Temp: 98.9 F (37.2 C)  Resp:    Wt Readings from Last 3 Encounters:  03/26/15 138 lb 0.1 oz (62.6 kg)  06/14/14 170 lb (77.111 kg)  04/22/14 160 lb (72.576 kg)    General: Pleasant, looks well. Comfortable. Head:  No swelling, no asymmetry. No signs of head trauma.  Eyes:  No scleral icterus, no conjunctival pallor. Ears:  Not hard of hearing  Nose:  No congestion or discharge Mouth:  Clear, moist. Dentition in good repair. Neck:  No JVD, no masses, no thyromegaly. Lungs:  Clear to auscultation and percussion bilaterally. Heart: RRR. No MRG. S1/S2 audible. Abdomen:  Soft.NT.  ND.   No masses. Positive hepatomegaly but liver not tender. Minimal tenderness if any in the epigastrium. Bowel sounds active..   Rectal: Prostate enlarged but smooth. No palpable or visible  hemorrhoids. Stool is medium brown/orangeish but FOBT negative   Musc/Skeltl: No spinal or limb deformities. Extremities:  No CCE.  Neurologic:  Oriented 3. No tremor, no asterixis. Alert. No gross deficits. Skin:  No rash, sores or telangiectasia Tattoos:  None seen Nodes:  No cervical adenopathy.   Psych:  Cooperative, pleasant, relaxed.  Intake/Output from previous day: 09/11 0701 - 09/12 0700 In: 3244 [P.O.:240; I.V.:955] Out: 700 [Urine:700] Intake/Output this shift: Total I/O In: 281 [P.O.:60; I.V.:221] Out: 530 [Urine:530]  LAB RESULTS:  Recent Labs  03/26/15 1445 03/26/15 2310 03/27/15 0406  WBC 4.1 3.6* 3.4*  HGB 18.3* 17.2* 15.5  HCT 50.6 49.5 44.0  PLT 179 153 148*   BMET Lab Results  Component Value Date   NA 135 03/27/2015   NA 133* 03/26/2015   NA 145 06/14/2014   K 4.5 03/27/2015   K 4.4 03/26/2015   K 4.3 06/14/2014   CL 104 03/27/2015   CL 94* 03/26/2015   CL 108 06/14/2014   CO2 17* 03/27/2015   CO2 17* 03/26/2015   CO2 25 06/14/2014   GLUCOSE 86 03/27/2015   GLUCOSE 79 03/26/2015   GLUCOSE 95 06/14/2014   BUN <5* 03/27/2015   BUN 7 03/26/2015   BUN 13 06/14/2014   CREATININE 1.10 03/27/2015   CREATININE 1.25* 03/26/2015   CREATININE 0.94 06/14/2014   CALCIUM 8.3* 03/27/2015   CALCIUM 9.5 03/26/2015   CALCIUM 8.7 06/14/2014   LFT  Recent Labs  03/26/15 1445 03/27/15 0406  PROT 8.1 6.4*  ALBUMIN 4.3 3.2*  AST 93* 63*  ALT 37 28  ALKPHOS 91 69  BILITOT 1.3* 1.6*   PT/INR Lab Results  Component Value Date   INR 1.10 04/22/2014   INR 1.04 12/25/2009   Hepatitis Panel No results for input(s): HEPBSAG, HCVAB, HEPAIGM, HEPBIGM in the last 72 hours. C-Diff No components found for: CDIFF Lipase     Component Value Date/Time   LIPASE 36 03/26/2015 1527  Drugs of Abuse  No results found for: LABOPIA, COCAINSCRNUR, LABBENZ, AMPHETMU, THCU, LABBARB   RADIOLOGY STUDIES: Ct Abdomen Pelvis W Contrast  03/26/2015    CLINICAL DATA:  Abdominal pain. Chills. Fatigue and nausea. Symptoms for 4 days. Personal history of prostate cancer.  EXAM: CT ABDOMEN AND PELVIS WITH CONTRAST  TECHNIQUE: Multidetector CT imaging of the abdomen and pelvis was performed using the standard protocol following bolus administration of intravenous contrast.  CONTRAST:  165mL OMNIPAQUE IOHEXOL 300 MG/ML  SOLN  COMPARISON:  None.  FINDINGS: Musculoskeletal:  No aggressive osseous lesions.  Lung Bases: Dependent atelectasis.  Liver:  Hepatosteatosis.  Liver span is 15 cm.  Spleen:  Normal.  Gallbladder:  Normal.  Common bile duct:  Normal.  Pancreas:  Normal.  Adrenal glands:  Normal.  Kidneys: Normal renal enhancement. No renal calculi. LEFT ureter appears normal. RIGHT ureter is also normal. Normal delayed excretion of contrast.  Stomach: Moderate hiatal hernia. No inflammatory changes of stomach.  Small bowel:  Normal duodenum.  Small bowel loops appear normal.  Colon: Normal appendix. Colonic diverticulosis. Negative for diverticulitis. Redundant sigmoid colon.  Pelvic Genitourinary: Prostatomegaly. Prostate impression on the bladder base.  Peritoneum: No free fluid or free air.  Vascular/lymphatic: Minimal atherosclerosis.  Body Wall: Normal.  IMPRESSION: 1. No acute abnormality. 2. Hepatosteatosis. 3. Moderate hiatal hernia.   Electronically Signed   By: Dereck Ligas M.D.   On: 03/26/2015 18:30   Dg Chest Port 1 View  03/26/2015   CLINICAL DATA:  History of 4 days of fatigue. Nausea vomiting and body aches with chills. Hypertension.  EXAM: PORTABLE CHEST - 1 VIEW  COMPARISON:  06/14/2014.  FINDINGS: The heart size and mediastinal contours are within normal limits. Both lungs are clear. The visualized skeletal structures are unremarkable.  IMPRESSION: No active disease.  Stable exam.   Electronically Signed   By: Staci Righter M.D.   On: 03/26/2015 15:30    ENDOSCOPIC STUDIES: Colonoscopy at the [facility] in 2014.  Sounds like he had  adenomatous colon polyps. Possibly had diverticulosis. What he says is he had a cancerous growth but surgery was never a consideration so he was probably told that he had a "precancerous" polyp .  Was advised he would need repeat study in 2-3 years.  IMPRESSION:   *  Hematemesis, epigastric pain. Hematemesis began following a number of incidences of nonbloody emesis. Suspect Mallory-Weiss tear. Rule out ulcer disease. Patient reported history of ulcer disease in the 1970s but never underwent any sort of diagnostic studies for this. Long history GERD which is normally fairly well controlled with over the counter omeprazole.  *  Bleeding per rectum. This has been a problem before several years. Colonoscopy in 2014. Sounds like he had a precancerous, adenomatous polyp. Diverticulosis may also have been seen. Question intermittent diverticular bleeds, question hemorrhoidal bleeding as he has had hemorrhoids in the past.  *  Rapid atrial fibrillation.  Converted back to normal sinus rhythm after Cardizem IV.  Cardizem has been discontinued and oral Lopressor being initiated for hypertension.  CHADS Vasc score of 1 so will not need anticoagulation.   *  Fatty liver with elevation of AST and T bili. Patient self report is of moderate ETOH, 3 cans of beer per week.   *  Hypertension.  On 3 BP meds at home.   *  Prostate cancer, being followed and managed conservatively by M[facility]D. at [facility].     PLAN:     *  Needs at least an EGD.  Will be done this afternoon. Whether or not he needs a colonoscopy now or can be referred back to the [facility] for follow-up of chronic rectal bleeding needs to be determined.  Since anticoagulation not required, no urgency to work up chronic rectal bleeding which was present [facility].   *  I doubt the patient has infectious colitis given the CT scan. The diarrhea has been associated with an acute GI illness. Not sure it's necessary to check a fecal  lactoferrin.  *  We will check hepatitis B and C serologies just to rule out chronic viral hepatitis.    Azucena Freed  03/27/2015, 12:37 PM Pager: [phone]    Attending Addendum: I have taken an interval history, reviewed the chart, and examined the patient. I agree with the Advanced Practitioner's note and impression. Patient with epigastric pain/vomiting in the setting of NSAID use. H/H stable. Discussed differential with patient and need for EGD to clarify source of pain and bleeding. We will rule out PUD, assess for H pylori. Mallory Weiss tear is also possible. Continue PPI in the interim, no NSAIDs. Further recommendations pending the result. Of note, AST/ALT elevation noted, suspect fatty liver disease based on CT. Will need to discuss alcohol use with him as this could be the etiology but recommend basic labs for chronic liver diseases to ensure negative if he has not had them done.   Scio Cellar, MD [facility] Pager [phone]

## 2015-03-27 NOTE — Progress Notes (Signed)
Echocardiogram 2D Echocardiogram has been performed.  Jeremy Hayden 03/27/2015, 11:30 AM

## 2015-03-27 NOTE — Progress Notes (Signed)
Patient converted to NSR on monitor. See strips. VSS see flowsheet. Patient resting comfortably in bed. Cardizem gtt continues to infuse. Monitoring closely.

## 2015-03-27 NOTE — Op Note (Signed)
[facility] [place], [id number]   ENDOSCOPY PROCEDURE REPORT  PATIENT: Jeremy Hayden, Jeremy Hayden  MR#: [id number] BIRTHDATE: 10/31/1955 , 59  yrs. old GENDER: male ENDOSCOPIST: [place]field Cellar, MD REFERRED BY: PROCEDURE DATE:  03/27/2015 PROCEDURE:  EGD w/ biopsy ASA CLASS:     Class II INDICATIONS:  59 y/o male presenting with vomiting and scant hematemesis. Recent NSAID use. MEDICATIONS: Midazolam (Versed) 7 mg IV and Fentanyl 100 mcg IV TOPICAL ANESTHETIC:  DESCRIPTION OF PROCEDURE: After the risks benefits and alternatives of the procedure were thoroughly explained, informed consent was obtained.  The Pentax Gastroscope [id number] endoscope was introduced through the mouth and advanced to the second portion of the duodenum , Without limitations.  The instrument was slowly withdrawn as the mucosa was fully examined.    FINDINGS: The esophagus was normal, no mucosal wrents or abnormalities noted.  The GEJ and SCJ were located 37cm from the incisors and was slightly irregular with 2 short, < 1cm tongues, of salmon colored mucosa noted.  Biopsies obtained.  The [facility] was located 42cm from the incisors with a 5cm hiatal hernia.  The GEJ was irregular on retroflexion and was biopsied.  The remainder of the examined stomach was normal.  The duodenal bulb and 2nd portion of the duodenum were normal.  Biopsies were taken of the distal and proximal stomach to rule out H pylori.   .     The scope was then withdrawn from the patient and the procedure completed.  COMPLICATIONS: There were no immediate complications.  ENDOSCOPIC IMPRESSION: Irregular SCJ in forward and retroflexed views- biopsied 5cm hiatal hernia Otherwise normal stomach and duodenum without clear etiology for the patient's hematemesis.  Overall, suspect the patient may have had a Mallory Weiss tear in the setting of recurrent vomiting, which has intervally  healed.  RECOMMENDATIONS: Clear liquid diet and advance as tolerated Continue omeprazole, increase to twice daily Anti-emetics as needed Await patholgy results No active GI bleeding noted on this exam, and no high risk stigmata appreciated. Patient cleared for discharge from GI perspective and can follow up as an outpatient with me or [facility] [place]. Await labs for chronic liver disease to ensure normal. Minimize alcohol use given fatty liver noted on RUQ [place].    eSigned:  [place] Cellar, MD 03/27/2015 3:58 PM    CC:  PATIENT NAME:  [place], Hippert MR#: [id number]

## 2015-03-27 NOTE — Progress Notes (Addendum)
[facility] PROGRESS NOTE  LAWSEN PATA Jeremy Hayden:[id number] DOB: Dec 02, 1955 DOA: 03/26/2015  [facility]  Brief HPI: 59 year old African-American male who gets his care at the [place] in [place] and with a past medical history of prostate cancer and essential hypertension, presented with complaints of nausea, vomiting, diarrhea and abdominal pain. He had episodes of blood in his emesis and stool. Evaluation in the ED also revealed the patient was in atrial fibrillation with RVR. He was admitted to the hospital for further management.  Past medical history:  Past Medical History  Diagnosis Date  . Cancer     prostate  . Hypertension   . GERD (gastroesophageal reflux disease)     Consultants: Gastroenterology  Procedures:  2-D echocardiogram Study Conclusions - Left ventricle: The cavity size was normal. Systolic function wasnormal. The estimated ejection fraction was in the range of 55%to 60%. Wall motion was normal; there were no regional wallmotion abnormalities.  Antibiotics: None  Subjective: Patient feels better this morning, however, continues to have pain in his upper abdomen. No further episodes of diarrhea or blood in the stool. Denies any further episodes of bloody emesis. No chest pain, no shortness of breath. No previously known history of atrial fibrillation. No history of stroke.  Objective: Vital Signs  Filed Vitals:   03/27/15 0407 03/27/15 0500 03/27/15 0600 03/27/15 0726  BP:  113/63 108/95   Pulse:  78 88   Temp: 98.6 F (37 C)   98.3 F (36.8 C)  TempSrc: Oral   Oral  Resp:  19 14   Height:      Weight:      SpO2:  97% 99%     Intake/Output Summary (Last 24 hours) at 03/27/15 0826 Last data filed at 03/27/15 0600  Gross per 24 hour  Intake   1088 ml  Output    700 ml  Net    388 ml   Filed Weights   03/26/15 2231  Weight: 62.6 kg (138 lb 0.1 oz)    General appearance: alert, cooperative, appears stated age and no distress Resp:  clear to auscultation bilaterally Cardio: regular rate and rhythm, S1, S2 normal, no murmur, click, rub or gallop GI: soft, mildly tender in the upper abdomen without any rebound, rigidity or guarding. No masses or organomegaly. Bowel sounds present. Extremities: extremities normal, atraumatic, no cyanosis or edema Pulses: 2+ and symmetric Neurologic: No focal deficits.  Lab Results:  Basic Metabolic Panel:  Recent Labs Lab 03/26/15 1445 03/27/15 0406  NA 133* 135  K 4.4 4.5  CL 94* 104  CO2 17* 17*  GLUCOSE 79 86  BUN 7 <5*  CREATININE 1.25* 1.10  CALCIUM 9.5 8.3*  MG  --  2.3  PHOS  --  2.4*   Liver Function Tests:  Recent Labs Lab 03/26/15 1445 03/27/15 0406  AST 93* 63*  ALT 37 28  ALKPHOS 91 69  BILITOT 1.3* 1.6*  PROT 8.1 6.4*  ALBUMIN 4.3 3.2*    Recent Labs Lab 03/26/15 1527  LIPASE 36   CBC:  Recent Labs Lab 03/26/15 1445 03/26/15 2310 03/27/15 0406  WBC 4.1 3.6* 3.4*  HGB 18.3* 17.2* 15.5  HCT 50.6 49.5 44.0  MCV 99.6 100.8* 99.3  PLT 179 153 148*   Cardiac Enzymes:  Recent Labs Lab 03/26/15 2310 03/27/15 0406  TROPONINI <0.03 <0.03     Recent Results (from the past 240 hour(s))  MRSA PCR Screening     Status: None  Collection Time: 03/26/15 10:20 PM  Result Value Ref Range Status   MRSA by PCR NEGATIVE NEGATIVE Final    Comment:        The GeneXpert MRSA Assay (FDA approved for NASAL specimens only), is one component of a comprehensive MRSA colonization surveillance program. It is not intended to diagnose MRSA infection nor to guide or monitor treatment for MRSA infections.       Studies/Results: Ct Abdomen Pelvis W Contrast  03/26/2015   CLINICAL DATA:  Abdominal pain. Chills. Fatigue and nausea. Symptoms for 4 days. Personal history of prostate cancer.  EXAM: CT ABDOMEN AND PELVIS WITH CONTRAST  TECHNIQUE: Multidetector CT imaging of the abdomen and pelvis was performed using the standard protocol following bolus  administration of intravenous contrast.  CONTRAST:  OMNIPAQUE IOHEXOL 300 MG/ML  SOLN  COMPARISON:  None.  FINDINGS: Musculoskeletal:  No aggressive osseous lesions.  Lung Bases: Dependent atelectasis.  Liver:  Hepatosteatosis.  Liver span is 15 cm.  Spleen:  Normal.  Gallbladder:  Normal.  Common bile duct:  Normal.  Pancreas:  Normal.  Adrenal glands:  Normal.  Kidneys: Normal renal enhancement. No renal calculi. LEFT ureter appears normal. RIGHT ureter is also normal. Normal delayed excretion of contrast.  Stomach: Moderate hiatal hernia. No inflammatory changes of stomach.  Small bowel:  Normal duodenum.  Small bowel loops appear normal.  Colon: Normal appendix. Colonic diverticulosis. Negative for diverticulitis. Redundant sigmoid colon.  Pelvic Genitourinary: Prostatomegaly. Prostate impression on the bladder base.  Peritoneum: No free fluid or free air.  Vascular/lymphatic: Minimal atherosclerosis.  Body Wall: Normal.  IMPRESSION: 1. No acute abnormality. 2. Hepatosteatosis. 3. Moderate hiatal hernia.   Electronically Signed   By: Andreas Newport M.D.   On: 03/26/2015 18:30   Dg Chest Port 1 View  03/26/2015   CLINICAL DATA:  History of 4 days of fatigue. Nausea vomiting and body aches with chills. Hypertension.  EXAM: PORTABLE CHEST - 1 VIEW  COMPARISON:  06/14/2014.  FINDINGS: The heart size and mediastinal contours are within normal limits. Both lungs are clear. The visualized skeletal structures are unremarkable.  IMPRESSION: No active disease.  Stable exam.   Electronically Signed   By: Elsie Stain M.D.   On: 03/26/2015 15:30    Medications:  Scheduled: . metoprolol tartrate  25 mg Oral BID  . pantoprazole (PROTONIX) IV  40 mg Intravenous Q12H  . sodium chloride  3 mL Intravenous Q12H   Continuous:  ZOX:[id number] **OR** acetaminophen, HYDROcodone-acetaminophen, ondansetron **OR** ondansetron (ZOFRAN) IV  Assessment/Plan:  Active Problems:   Hypertension   Atrial  fibrillation with RVR   Dehydration   Chest pain   Gastroenteritis    Atrial fibrillation with RVR Likely triggered by his GI illness. Was started on diltiazem infusion. Currently in sinus rhythm. We will stop his diltiazem infusion. We will start him on oral beta blocker. His CHADS score is 1. At this time he does not need anticoagulation. Once GI evaluation is completed, and if there are no contraindications, he will be placed on aspirin. He will need outpatient follow-up with cardiology. Echocardiogram as above. TSH is normal.  Hematemesis Continue proton pump inhibitors. Gastroenterology has been consulted. He might need to undergo EGD. He reports a history of peptic ulcer disease in the past. Has used a lot of BC powder in the last 1 week.  Diarrhea with abdominal pain and transient hematochezia Diarrhea appears to be subsiding. He did notice small amounts of blood in stool a few  days ago. CT of the abdomen and pelvis does not show any acute findings. Reports a colonoscopy within the last few years. GI to decide if he needs to undergo inpatient colonoscopy or if it can be deferred to the outpatient setting.  History of essential hypertension Norvasc is on hold. Continue to monitor blood pressures  Atypical chest pain Likely secondary to his GI illness. Troponins are normal. Echocardiogram does not show any wall motion abnormalities.  Elevated LFT's Monitor. Hepatic steatosis noted on CT. Hepatitis panel ordered.  DVT Prophylaxis: SCDs    Code Status: Full code  Family Communication: Discussed with the patient  Disposition Plan: Await GI input.     LOS: 1 day   [facility]  [facility] Pager [phone] 03/27/2015, 8:26 AM  If 7PM-7AM, please contact night-coverage at [email or web address], password [facility]

## 2015-03-28 LAB — HEPATITIS A ANTIBODY, IGM: Hep A IgM: NEGATIVE

## 2015-03-28 LAB — COMPREHENSIVE METABOLIC PANEL
ALBUMIN: 3.4 g/dL — AB (ref 3.5–5.0)
ALK PHOS: 67 U/L (ref 38–126)
ALT: 43 U/L (ref 17–63)
ANION GAP: 7 (ref 5–15)
AST: 117 U/L — ABNORMAL HIGH (ref 15–41)
BUN: <5 mg/dL — ABNORMAL LOW (ref 6–20)
CALCIUM: 9.1 mg/dL (ref 8.9–10.3)
CO2: 25 mmol/L (ref 22–32)
Chloride: 105 mmol/L (ref 101–111)
Creatinine, Ser: 0.88 mg/dL (ref 0.61–1.24)
GFR calc Af Amer: >60 mL/min (ref >60)
GFR calc non Af Amer: >60 mL/min (ref >60)
GLUCOSE: 97 mg/dL (ref 65–99)
Potassium: 4.1 mmol/L (ref 3.5–5.1)
SODIUM: 137 mmol/L (ref 135–145)
Total Bilirubin: 1.3 mg/dL — ABNORMAL HIGH (ref 0.3–1.2)
Total Protein: 6.6 g/dL (ref 6.5–8.1)

## 2015-03-28 LAB — ANTINUCLEAR ANTIBODIES, IFA: ANA Ab, IFA: NEGATIVE

## 2015-03-28 LAB — HEPATITIS C ANTIBODY: HCV Ab: <0.1 s/co ratio (ref 0.0–0.9)

## 2015-03-28 LAB — HEPATITIS B SURFACE ANTIGEN: Hepatitis B Surface Ag: NEGATIVE

## 2015-03-28 LAB — CBC
HCT: 47.5 % (ref 39.0–52.0)
HEMOGLOBIN: 16.6 g/dL (ref 13.0–17.0)
MCH: 34.5 pg — ABNORMAL HIGH (ref 26.0–34.0)
MCHC: 34.9 g/dL (ref 30.0–36.0)
MCV: 98.8 fL (ref 78.0–100.0)
Platelets: 151 10*3/uL (ref 150–400)
RBC: 4.81 MIL/uL (ref 4.22–5.81)
RDW: 11.6 % (ref 11.5–15.5)
WBC: 2.9 10*3/uL — ABNORMAL LOW (ref 4.0–10.5)

## 2015-03-28 LAB — HEPATITIS B SURFACE ANTIBODY, QUANTITATIVE

## 2015-03-28 LAB — ALPHA-1-ANTITRYPSIN: A1 ANTITRYPSIN SER: 123 mg/dL (ref 90–200)

## 2015-03-28 LAB — ANTI-SMOOTH MUSCLE ANTIBODY, IGG: F-Actin IgG: 9 Units (ref 0–19)

## 2015-03-28 LAB — CERULOPLASMIN: CERULOPLASMIN: 31.3 mg/dL — AB (ref 16.0–31.0)

## 2015-03-28 LAB — HEMOGLOBIN A1C
Hgb A1c MFr Bld: 5.1 % (ref 4.8–5.6)
MEAN PLASMA GLUCOSE: 100 mg/dL

## 2015-03-28 LAB — IGG: IgG (Immunoglobin G), Serum: 1036 mg/dL (ref 700–1600)

## 2015-03-28 MED ORDER — OFF THE BEAT BOOK
Freq: Once | Status: DC
  Filled 2015-03-28: qty 1

## 2015-03-28 MED ORDER — ASPIRIN EC 325 MG PO TBEC: 325.0000 mg | DELAYED_RELEASE_TABLET | Freq: Every day | ORAL | Status: DC

## 2015-03-28 MED ORDER — UNABLE TO FIND: Status: DC

## 2015-03-28 MED ORDER — METOPROLOL TARTRATE 25 MG PO TABS: 50.0000 mg | ORAL_TABLET | Freq: Two times a day (BID) | ORAL | Status: DC

## 2015-03-28 MED ORDER — PANTOPRAZOLE SODIUM 40 MG PO TBEC
40.0000 mg | DELAYED_RELEASE_TABLET | Freq: Two times a day (BID) | ORAL | Status: DC
  Administered 2015-03-28: 40 mg via ORAL
  Filled 2015-03-28: qty 1

## 2015-03-28 NOTE — Discharge Summary (Signed)
[facility]  Physician Discharge Summary   Patient ID: Jeremy Hayden MRN: [id number] DOB/AGE: 1956/03/16 59 y.o.  Admit date: 03/26/2015 Discharge date: 03/28/2015  [facility]  DISCHARGE DIAGNOSES:  Active Problems:   Hypertension   Atrial fibrillation with RVR   Dehydration   Chest pain   Gastroenteritis   Hematemesis with nausea   RECOMMENDATIONS FOR OUTPATIENT FOLLOW UP: 1. Patient recommended to follow-up with his providers at the [facility] for his atrial fibrillation as well as episodes of rectal bleeding   DISCHARGE CONDITION: fair  Diet recommendation: Heart healthy  Filed Weights   03/26/15 2231  Weight: 62.6 kg (138 lb 0.1 oz)    INITIAL HISTORY: 59 year old African-American male who gets his care at the [place] in [place] and with a past medical history of prostate cancer and essential hypertension, presented with complaints of nausea, vomiting, diarrhea and abdominal pain. He had episodes of blood in his emesis and stool. Evaluation in the ED also revealed the patient was in atrial fibrillation with RVR. He was admitted to the hospital for further management.  Consultations:  Gastroenterology  Procedures: EGD 9/12 ENDOSCOPIC IMPRESSION: "Irregular SCJ in forward and retroflexed views- biopsied 5cm hiatal hernia Otherwise normal stomach and duodenum without clear etiology for the patient's hematemesis. Overall, suspect the patient may have had a Mallory Weiss tear in the setting of recurrent vomiting, which has intervally healed."  2-D echocardiogram Study Conclusions - Left ventricle: The cavity size was normal. Systolic function wasnormal. The estimated ejection fraction was in the range of 55%to 60%. Wall motion was normal; there were no regional wallmotion abnormalities.  HOSPITAL COURSE:   Atrial fibrillation with RVR Likely triggered by his GI illness. Patient was started on diltiazem infusion. He converted to sinus rhythm. He was switched  over to oral metoprolol. Heart rate is very well controlled this morning. Rate is in the 80s. Dose of metoprolol will be increased. His CHADS score is 1. At this time he does not need anticoagulation. Okay to use aspirin per gastroenterology. This was discussed with them yesterday. He has been asked to follow-up with his providers at the [facility] for further management. Echocardiogram as above. TSH is normal.  Hematemesis No further episodes. Status post EGD. No ulcerations noted. Hematemesis thought to be secondary to Mallory-Weiss tear. He's been asked to avoid using NSAIDs. Continue with his PPI.   Diarrhea with abdominal pain and transient hematochezia Could've had acute gastroenteritis. Diarrhea has subsided. He has tolerated his diet. No further bleeding. CT of the abdomen and pelvis does not show any acute findings. Reports a colonoscopy within the last few years. GI has decided against inpatient colonoscopy. They have recommended he follow-up with his providers at the [place].   History of essential hypertension Since he has been started on metoprolol, his amlodipine has been discontinued. He may continue his other medications.   Atypical chest pain Likely secondary to his GI illness. Troponins are normal. Echocardiogram does not show any wall motion abnormalities. No further episodes of pain.  Elevated LFT's Hepatic steatosis noted on CT. Hepatitis panel ordered and is pending. Patient has been asked to stop alcohol intake. Patient follow-up.  Overall, stable. Feels better. Okay for discharge.   PERTINENT LABS:  The results of significant diagnostics from this hospitalization (including imaging, microbiology, ancillary and laboratory) are listed below for reference.    Microbiology: Recent Results (from the past 240 hour(s))  MRSA PCR Screening     Status: None   Collection Time:  03/26/15 10:20 PM  Result Value Ref Range Status   MRSA by PCR NEGATIVE NEGATIVE Final    Comment:        The  GeneXpert MRSA Assay (FDA approved for NASAL specimens only), is one component of a comprehensive MRSA colonization surveillance program. It is not intended to diagnose MRSA infection nor to guide or monitor treatment for MRSA infections.      Labs: Basic Metabolic Panel:  Recent Labs Lab 03/26/15 1445 03/27/15 0406 03/28/15 0221  NA 133* 135 137  K 4.4 4.5 4.1  CL 94* 104 105  CO2 17* 17* 25  GLUCOSE 79 86 97  BUN 7 <5* <5*  CREATININE 1.25* 1.10 0.88  CALCIUM 9.5 8.3* 9.1  MG  --  2.3  --   PHOS  --  2.4*  --    Liver Function Tests:  Recent Labs Lab 03/26/15 1445 03/27/15 0406 03/28/15 0221  AST 93* 63* 117*  ALT 37 28 43  ALKPHOS 91 69 67  BILITOT 1.3* 1.6* 1.3*  PROT 8.1 6.4* 6.6  ALBUMIN 4.3 3.2* 3.4*    Recent Labs Lab 03/26/15 1527  LIPASE 36   CBC:  Recent Labs Lab 03/26/15 1445 03/26/15 2310 03/27/15 0406 03/28/15 0221  WBC 4.1 3.6* 3.4* 2.9*  HGB 18.3* 17.2* 15.5 16.6  HCT 50.6 49.5 44.0 47.5  MCV 99.6 100.8* 99.3 98.8  PLT 179 153 148* 151   Cardiac Enzymes:  Recent Labs Lab 03/26/15 2310 03/27/15 0406 03/27/15 1025  TROPONINI <0.03 <0.03 <0.03    IMAGING STUDIES Ct Abdomen Pelvis W Contrast  03/26/2015   CLINICAL DATA:  Abdominal pain. Chills. Fatigue and nausea. Symptoms for 4 days. Personal history of prostate cancer.  EXAM: CT ABDOMEN AND PELVIS WITH CONTRAST  TECHNIQUE: Multidetector CT imaging of the abdomen and pelvis was performed using the standard protocol following bolus administration of intravenous contrast.  CONTRAST:  OMNIPAQUE IOHEXOL 300 MG/ML  SOLN  COMPARISON:  None.  FINDINGS: Musculoskeletal:  No aggressive osseous lesions.  Lung Bases: Dependent atelectasis.  Liver:  Hepatosteatosis.  Liver span is 15 cm.  Spleen:  Normal.  Gallbladder:  Normal.  Common bile duct:  Normal.  Pancreas:  Normal.  Adrenal glands:  Normal.  Kidneys: Normal renal enhancement. No renal calculi. LEFT ureter appears normal.  RIGHT ureter is also normal. Normal delayed excretion of contrast.  Stomach: Moderate hiatal hernia. No inflammatory changes of stomach.  Small bowel:  Normal duodenum.  Small bowel loops appear normal.  Colon: Normal appendix. Colonic diverticulosis. Negative for diverticulitis. Redundant sigmoid colon.  Pelvic Genitourinary: Prostatomegaly. Prostate impression on the bladder base.  Peritoneum: No free fluid or free air.  Vascular/lymphatic: Minimal atherosclerosis.  Body Wall: Normal.  IMPRESSION: 1. No acute abnormality. 2. Hepatosteatosis. 3. Moderate hiatal hernia.   Electronically Signed   By: Andreas Newport M.D.   On: 03/26/2015 18:30   Dg Chest Port 1 View  03/26/2015   CLINICAL DATA:  History of 4 days of fatigue. Nausea vomiting and body aches with chills. Hypertension.  EXAM: PORTABLE CHEST - 1 VIEW  COMPARISON:  06/14/2014.  FINDINGS: The heart size and mediastinal contours are within normal limits. Both lungs are clear. The visualized skeletal structures are unremarkable.  IMPRESSION: No active disease.  Stable exam.   Electronically Signed   By: Elsie Stain M.D.   On: 03/26/2015 15:30    DISCHARGE EXAMINATION: Filed Vitals:   03/27/15 2225 03/27/15 2341 03/28/15 0340 03/28/15 0747  BP:  100/71 149/106 136/87 136/98  Pulse: 87  81 85  Temp:  98.2 F (36.8 C) 98.2 F (36.8 C) 98.2 F (36.8 C)  TempSrc:  Oral Oral Oral  Resp: 14 16 12 21   Height:      Weight:      SpO2: 99% 100% 97% 96%   General appearance: alert, cooperative, appears stated age and no distress Resp: clear to auscultation bilaterally Cardio: regular rate and rhythm, S1, S2 normal, no murmur, click, rub or gallop GI: soft, non-tender; bowel sounds normal; no masses,  no organomegaly Extremities: extremities normal, atraumatic, no cyanosis or edema  DISPOSITION: Home  Discharge Instructions    Call MD for:  difficulty breathing, headache or visual disturbances    Complete by:  As directed      Call MD for:   extreme fatigue    Complete by:  As directed      Call MD for:  persistant dizziness or light-headedness    Complete by:  As directed      Call MD for:  persistant nausea and vomiting    Complete by:  As directed      Call MD for:  severe uncontrolled pain    Complete by:  As directed      Call MD for:  temperature >100.4    Complete by:  As directed      Diet - low sodium heart healthy    Complete by:  As directed      Discharge instructions    Complete by:  As directed   Please follow up with your providers at the [facility] for rectal bleeding and atrial fibrillation. Please stop drinking alcohol. Your liver will need to be followed as well as there are early signs of liver disease, presumably due to alcohol. Please note that we have stopped your amlodipine and added a new medication as discussed.  You were cared for by a hospitalist during your hospital stay. If you have any questions about your discharge medications or the care you received while you were in the hospital after you are discharged, you can call the unit and asked to speak with the hospitalist on call if the hospitalist that took care of you is not available. Once you are discharged, your primary care physician will handle any further medical issues. Please note that NO REFILLS for any discharge medications will be authorized once you are discharged, as it is imperative that you return to your primary care physician (or establish a relationship with a primary care physician if you do not have one) for your aftercare needs so that they can reassess your need for medications and monitor your lab values. If you do not have a primary care physician, you can call [phone] for a physician referral.     Increase activity slowly    Complete by:  As directed            ALLERGIES: No Known Allergies   Discharge Medication List as of 03/28/2015 10:14 AM    START taking these medications   Details  aspirin EC 325 MG tablet Take 1 tablet  (325 mg total) by mouth daily., Starting 03/28/2015, Until Discontinued, Print    metoprolol tartrate (LOPRESSOR) 25 MG tablet Take 2 tablets (50 mg total) by mouth 2 (two) times daily., Starting 03/28/2015, Until Discontinued, Print    UNABLE TO FIND Mr. Nourse was admitted to [facility] from 03/26/15 till 03/28/15. He may return to work after 1 week.  Please contact me if you have any questions.  Osvaldo Shipper [facility] [phone], Print      CONTINUE these medications which have NOT CHANGED   Details  lisinopril (PRINIVIL,ZESTRIL) 40 MG tablet Take 40 mg by mouth daily., Until Discontinued, Historical Med    omeprazole (PRILOSEC) 20 MG capsule Take 20 mg by mouth daily., Until Discontinued, Historical Med      STOP taking these medications     amLODipine (NORVASC) 5 MG tablet      Aspirin-Salicylamide-Caffeine (BC HEADACHE POWDER PO)      butalbital-acetaminophen-caffeine (FIORICET) 50-325-40 MG per tablet      HYDROcodone-acetaminophen (NORCO/VICODIN) 5-325 MG per tablet      predniSONE (DELTASONE) 10 MG tablet        Follow-up Information    Follow up with [facility]. Schedule an appointment as soon as possible for a visit in 1 week.   Specialty:  General Practice   Why:  call the clinic to be seen regarding rectal bleeding and for new atrial fibrillation   Contact information:   [address]  [facility] [address] [phone]       TOTAL DISCHARGE TIME: 35 mins  [facility]  [facility] Pager [phone]  03/28/2015, 2:18 PM

## 2015-03-28 NOTE — Discharge Instructions (Signed)
Atrial Fibrillation °Atrial fibrillation is a type of irregular heart rhythm (arrhythmia). During atrial fibrillation, the upper chambers of the heart (atria) quiver continuously in a chaotic pattern. This causes an irregular and often rapid heart rate.  °Atrial fibrillation is the result of the heart becoming overloaded with disorganized signals that tell it to beat. These signals are normally released one at a time by a part of the right atrium called the sinoatrial node. They then travel from the atria to the lower chambers of the heart (ventricles), causing the atria and ventricles to contract and pump blood as they pass. In atrial fibrillation, parts of the atria outside of the sinoatrial node also release these signals. This results in two problems. First, the atria receive so many signals that they do not have time to fully contract. Second, the ventricles, which can only receive one signal at a time, beat irregularly and out of rhythm with the atria.  °There are three types of atrial fibrillation:  °· Paroxysmal. Paroxysmal atrial fibrillation starts suddenly and stops on its own within a week. °· Persistent. Persistent atrial fibrillation lasts for more than a week. It may stop on its own or with treatment. °· Permanent. Permanent atrial fibrillation does not go away. Episodes of atrial fibrillation may lead to permanent atrial fibrillation. °Atrial fibrillation can prevent your heart from pumping blood normally. It increases your risk of stroke and can lead to heart failure.  °CAUSES  °· Heart conditions, including a heart attack, heart failure, coronary artery disease, and heart valve conditions.   °· Inflammation of the sac that surrounds the heart (pericarditis). °· Blockage of an artery in the lungs (pulmonary embolism). °· Pneumonia or other infections. °· Chronic lung disease. °· Thyroid problems, especially if the thyroid is overactive (hyperthyroidism). °· Caffeine, excessive alcohol use, and use  of some illegal drugs.   °· Use of some medicines, including certain decongestants and diet pills. °· Heart surgery.   °· Birth defects.   °Sometimes, no cause can be found. When this happens, the atrial fibrillation is called lone atrial fibrillation. The risk of complications from atrial fibrillation increases if you have lone atrial fibrillation and you are age 60 years or older. °RISK FACTORS °· Heart failure. °· Coronary artery disease. °· Diabetes mellitus.   °· High blood pressure (hypertension).   °· Obesity.   °· Other arrhythmias.   °· Increased age. °SIGNS AND SYMPTOMS  °· A feeling that your heart is beating rapidly or irregularly.   °· A feeling of discomfort or pain in your chest.   °· Shortness of breath.   °· Sudden light-headedness or weakness.   °· Getting tired easily when exercising.   °· Urinating more often than normal (mainly when atrial fibrillation first begins).   °In paroxysmal atrial fibrillation, symptoms may start and suddenly stop. °DIAGNOSIS  °Your health care provider may be able to detect atrial fibrillation when taking your pulse. Your health care provider may have you take a test called an ambulatory electrocardiogram (ECG). An ECG records your heartbeat patterns over a 24-hour period. You may also have other tests, such as: °· Transthoracic echocardiogram (TTE). During echocardiography, sound waves are used to evaluate how blood flows through your heart. °· Transesophageal echocardiogram (TEE). °· Stress test. There is more than one type of stress test. If a stress test is needed, ask your health care provider about which type is best for you. °· Chest X-ray exam. °· Blood tests. °· Computed tomography (CT). °TREATMENT  °Treatment may include: °· Treating any underlying conditions. For example, if you   have an overactive thyroid, treating the condition may correct atrial fibrillation.  Taking medicine. Medicines may be given to control a rapid heart rate or to prevent blood  clots, heart failure, or a stroke.  Having a procedure to correct the rhythm of the heart:  Electrical cardioversion. During electrical cardioversion, a controlled, low-energy shock is delivered to the heart through your skin. If you have chest pain, very low blood pressure, or sudden heart failure, this procedure may need to be done as an emergency.  Catheter ablation. During this procedure, heart tissues that send the signals that cause atrial fibrillation are destroyed.  Surgical ablation. During this surgery, thin lines of heart tissue that carry the abnormal signals are destroyed. This procedure can either be an open-heart surgery or a minimally invasive surgery. With the minimally invasive surgery, small cuts are made to access the heart instead of a large opening.  Pulmonary venous isolation. During this surgery, tissue around the veins that carry blood from the lungs (pulmonary veins) is destroyed. This tissue is thought to carry the abnormal signals. HOME CARE INSTRUCTIONS   Take medicines only as directed by your health care provider. Some medicines can make atrial fibrillation worse or recur.  If blood thinners were prescribed by your health care provider, take them exactly as directed. Too much blood-thinning medicine can cause bleeding. If you take too little, you will not have the needed protection against stroke and other problems.  Perform blood tests at home if directed by your health care provider. Perform blood tests exactly as directed.  Quit smoking if you smoke.  Do not drink alcohol.  Do not drink caffeinated beverages such as coffee, soda, and some teas. You may drink decaffeinated coffee, soda, or tea.   Maintain a healthy weight.Do not use diet pills unless your health care provider approves. They may make heart problems worse.   Follow diet instructions as directed by your health care provider.  Exercise regularly as directed by your health care  provider.  Keep all follow-up visits as directed by your health care provider. This is important. PREVENTION  The following substances can cause atrial fibrillation to recur:   Caffeinated beverages.  Alcohol.  Certain medicines, especially those used for breathing problems.  Certain herbs and herbal medicines, such as those containing ephedra or ginseng.  Illegal drugs, such as cocaine and amphetamines. Sometimes medicines are given to prevent atrial fibrillation from recurring. Proper treatment of any underlying condition is also important in helping prevent recurrence.  SEEK MEDICAL CARE IF:  You notice a change in the rate, rhythm, or strength of your heartbeat.  You suddenly begin urinating more frequently.  You tire more easily when exerting yourself or exercising. SEEK IMMEDIATE MEDICAL CARE IF:   You have chest pain, abdominal pain, sweating, or weakness.  You feel nauseous.  You have shortness of breath.  You suddenly have swollen feet and ankles.  You feel dizzy.  Your face or limbs feel numb or weak.  You have a change in your vision or speech. MAKE SURE YOU:   Understand these instructions.  Will watch your condition.  Will get help right away if you are not doing well or get worse. Document Released: 07/01/2005 Document Revised: 11/15/2013 Document Reviewed: 08/11/2012 [facility] Patient Information 2015 [place], [place]. This information is not intended to replace advice given to you by your health care provider. Make sure you discuss any questions you have with your health care provider.  Atrial Fibrillation Atrial fibrillation is  a type of irregular heart rhythm (arrhythmia). During atrial fibrillation, the upper chambers of the heart (atria) quiver continuously in a chaotic pattern. This causes an irregular and often rapid heart rate.  Atrial fibrillation is the result of the heart becoming overloaded with disorganized signals that tell it to beat.  These signals are normally released one at a time by a part of the right atrium called the sinoatrial node. They then travel from the atria to the lower chambers of the heart (ventricles), causing the atria and ventricles to contract and pump blood as they pass. In atrial fibrillation, parts of the atria outside of the sinoatrial node also release these signals. This results in two problems. First, the atria receive so many signals that they do not have time to fully contract. Second, the ventricles, which can only receive one signal at a time, beat irregularly and out of rhythm with the atria.  There are three types of atrial fibrillation:   Paroxysmal. Paroxysmal atrial fibrillation starts suddenly and stops on its own within a week.  Persistent. Persistent atrial fibrillation lasts for more than a week. It may stop on its own or with treatment.  Permanent. Permanent atrial fibrillation does not go away. Episodes of atrial fibrillation may lead to permanent atrial fibrillation. Atrial fibrillation can prevent your heart from pumping blood normally. It increases your risk of stroke and can lead to heart failure.  CAUSES   Heart conditions, including a heart attack, heart failure, coronary artery disease, and heart valve conditions.   Inflammation of the sac that surrounds the heart (pericarditis).  Blockage of an artery in the lungs (pulmonary embolism).  Pneumonia or other infections.  Chronic lung disease.  Thyroid problems, especially if the thyroid is overactive (hyperthyroidism).  Caffeine, excessive alcohol use, and use of some illegal drugs.   Use of some medicines, including certain decongestants and diet pills.  Heart surgery.   Birth defects.  Sometimes, no cause can be found. When this happens, the atrial fibrillation is called lone atrial fibrillation. The risk of complications from atrial fibrillation increases if you have lone atrial fibrillation and you are age 60  years or older. RISK FACTORS  Heart failure.  Coronary artery disease.  Diabetes mellitus.   High blood pressure (hypertension).   Obesity.   Other arrhythmias.   Increased age. SIGNS AND SYMPTOMS   A feeling that your heart is beating rapidly or irregularly.   A feeling of discomfort or pain in your chest.   Shortness of breath.   Sudden light-headedness or weakness.   Getting tired easily when exercising.   Urinating more often than normal (mainly when atrial fibrillation first begins).  In paroxysmal atrial fibrillation, symptoms may start and suddenly stop. DIAGNOSIS  Your health care provider may be able to detect atrial fibrillation when taking your pulse. Your health care provider may have you take a test called an ambulatory electrocardiogram (ECG). An ECG records your heartbeat patterns over a 24-hour period. You may also have other tests, such as:  Transthoracic echocardiogram (TTE). During echocardiography, sound waves are used to evaluate how blood flows through your heart.  Transesophageal echocardiogram (TEE).  Stress test. There is more than one type of stress test. If a stress test is needed, ask your health care provider about which type is best for you.  Chest X-ray exam.  Blood tests.  Computed tomography (CT). TREATMENT  Treatment may include:  Treating any underlying conditions. For example, if you have an overactive thyroid,  treating the condition may correct atrial fibrillation.  Taking medicine. Medicines may be given to control a rapid heart rate or to prevent blood clots, heart failure, or a stroke.  Having a procedure to correct the rhythm of the heart:  Electrical cardioversion. During electrical cardioversion, a controlled, low-energy shock is delivered to the heart through your skin. If you have chest pain, very low blood pressure, or sudden heart failure, this procedure may need to be done as an emergency.  Catheter  ablation. During this procedure, heart tissues that send the signals that cause atrial fibrillation are destroyed.  Surgical ablation. During this surgery, thin lines of heart tissue that carry the abnormal signals are destroyed. This procedure can either be an open-heart surgery or a minimally invasive surgery. With the minimally invasive surgery, small cuts are made to access the heart instead of a large opening.  Pulmonary venous isolation. During this surgery, tissue around the veins that carry blood from the lungs (pulmonary veins) is destroyed. This tissue is thought to carry the abnormal signals. HOME CARE INSTRUCTIONS   Take medicines only as directed by your health care provider. Some medicines can make atrial fibrillation worse or recur.  If blood thinners were prescribed by your health care provider, take them exactly as directed. Too much blood-thinning medicine can cause bleeding. If you take too little, you will not have the needed protection against stroke and other problems.  Perform blood tests at home if directed by your health care provider. Perform blood tests exactly as directed.  Quit smoking if you smoke.  Do not drink alcohol.  Do not drink caffeinated beverages such as coffee, soda, and some teas. You may drink decaffeinated coffee, soda, or tea.   Maintain a healthy weight.Do not use diet pills unless your health care provider approves. They may make heart problems worse.   Follow diet instructions as directed by your health care provider.  Exercise regularly as directed by your health care provider.  Keep all follow-up visits as directed by your health care provider. This is important. PREVENTION  The following substances can cause atrial fibrillation to recur:   Caffeinated beverages.  Alcohol.  Certain medicines, especially those used for breathing problems.  Certain herbs and herbal medicines, such as those containing ephedra or ginseng.  Illegal  drugs, such as cocaine and amphetamines. Sometimes medicines are given to prevent atrial fibrillation from recurring. Proper treatment of any underlying condition is also important in helping prevent recurrence.  SEEK MEDICAL CARE IF:  You notice a change in the rate, rhythm, or strength of your heartbeat.  You suddenly begin urinating more frequently.  You tire more easily when exerting yourself or exercising. SEEK IMMEDIATE MEDICAL CARE IF:   You have chest pain, abdominal pain, sweating, or weakness.  You feel nauseous.  You have shortness of breath.  You suddenly have swollen feet and ankles.  You feel dizzy.  Your face or limbs feel numb or weak.  You have a change in your vision or speech. MAKE SURE YOU:   Understand these instructions.  Will watch your condition.  Will get help right away if you are not doing well or get worse. Document Released: 07/01/2005 Document Revised: 11/15/2013 Document Reviewed: 08/11/2012 [facility] Patient Information 2015 [place], [place]. This information is not intended to replace advice given to you by your health care provider. Make sure you discuss any questions you have with your health care provider.

## 2015-03-29 ENCOUNTER — Encounter (HOSPITAL_COMMUNITY): Payer: Self-pay | Admitting: Gastroenterology

## 2015-04-05 ENCOUNTER — Encounter: Payer: Self-pay | Admitting: *Deleted

## 2015-10-07 ENCOUNTER — Emergency Department (HOSPITAL_COMMUNITY): Payer: Non-veteran care

## 2015-10-07 ENCOUNTER — Emergency Department (HOSPITAL_COMMUNITY)
Admission: EM | Admit: 2015-10-07 | Discharge: 2015-10-07 | Disposition: A | Discharge status: Home / Self Care | Payer: Non-veteran care | Attending: Emergency Medicine | Admitting: Emergency Medicine

## 2015-10-07 ENCOUNTER — Encounter (HOSPITAL_COMMUNITY): Payer: Self-pay | Admitting: *Deleted

## 2015-10-07 DIAGNOSIS — S0990XA Unspecified injury of head, initial encounter: Secondary | ICD-10-CM | POA: Diagnosis present

## 2015-10-07 DIAGNOSIS — Y9389 Activity, other specified: Secondary | ICD-10-CM | POA: Diagnosis not present

## 2015-10-07 DIAGNOSIS — D849 Immunodeficiency, unspecified: Secondary | ICD-10-CM | POA: Diagnosis not present

## 2015-10-07 DIAGNOSIS — S0101XA Laceration without foreign body of scalp, initial encounter: Secondary | ICD-10-CM | POA: Insufficient documentation

## 2015-10-07 DIAGNOSIS — Y92410 Unspecified street and highway as the place of occurrence of the external cause: Secondary | ICD-10-CM | POA: Insufficient documentation

## 2015-10-07 DIAGNOSIS — Z8546 Personal history of malignant neoplasm of prostate: Secondary | ICD-10-CM | POA: Insufficient documentation

## 2015-10-07 DIAGNOSIS — Z79899 Other long term (current) drug therapy: Secondary | ICD-10-CM | POA: Diagnosis not present

## 2015-10-07 DIAGNOSIS — K219 Gastro-esophageal reflux disease without esophagitis: Secondary | ICD-10-CM | POA: Insufficient documentation

## 2015-10-07 DIAGNOSIS — Z7982 Long term (current) use of aspirin: Secondary | ICD-10-CM | POA: Insufficient documentation

## 2015-10-07 DIAGNOSIS — I10 Essential (primary) hypertension: Secondary | ICD-10-CM | POA: Diagnosis not present

## 2015-10-07 DIAGNOSIS — R000 Tachycardia, unspecified: Secondary | ICD-10-CM | POA: Diagnosis not present

## 2015-10-07 DIAGNOSIS — Y998 Other external cause status: Secondary | ICD-10-CM | POA: Diagnosis not present

## 2015-10-07 DIAGNOSIS — W19XXXA Unspecified fall, initial encounter: Secondary | ICD-10-CM

## 2015-10-07 DIAGNOSIS — S50312A Abrasion of left elbow, initial encounter: Secondary | ICD-10-CM | POA: Diagnosis not present

## 2015-10-07 LAB — I-STAT CHEM 8, ED
BUN: 5 mg/dL — ABNORMAL LOW (ref 6–20)
CALCIUM ION: 1.2 mmol/L (ref 1.13–1.30)
Chloride: 103 mmol/L (ref 101–111)
Creatinine, Ser: 0.9 mg/dL (ref 0.61–1.24)
GLUCOSE: 116 mg/dL — AB (ref 65–99)
HCT: 51 % (ref 39.0–52.0)
Hemoglobin: 17.3 g/dL — ABNORMAL HIGH (ref 13.0–17.0)
POTASSIUM: 3.5 mmol/L (ref 3.5–5.1)
Sodium: 142 mmol/L (ref 135–145)
TCO2: 23 mmol/L (ref 0–100)

## 2015-10-07 MED ORDER — LIDOCAINE-EPINEPHRINE (PF) 2 %-1:200000 IJ SOLN
10.0000 mL | Freq: Once | INTRAMUSCULAR | Status: AC
  Administered 2015-10-07: 10 mL
  Filled 2015-10-07: qty 20

## 2015-10-07 MED ORDER — BACITRACIN ZINC 500 UNIT/GM EX OINT
TOPICAL_OINTMENT | Freq: Two times a day (BID) | CUTANEOUS | Status: DC
  Administered 2015-10-07: 1 via TOPICAL
  Filled 2015-10-07: qty 1.8

## 2015-10-07 MED ORDER — ACETAMINOPHEN 500 MG PO TABS
1000.0000 mg | ORAL_TABLET | Freq: Once | ORAL | Status: AC
Start: 2015-10-07 — End: 2015-10-07
  Administered 2015-10-07: 1000 mg via ORAL
  Filled 2015-10-07: qty 2
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
## 2015-10-07 NOTE — ED Notes (Signed)
Patient transported to CT 
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
## 2015-10-07 NOTE — ED Notes (Signed)
MD at bedside. 

## 2015-10-07 NOTE — Discharge Instructions (Signed)
Take Tylenol as directed for pain. It is okay to wash your hair hair in 24 hours as you normally would. Wash your abrasions daily with soap and water and place a thin layer of bacitracin ointment over the wounds. Signs of infection include redness more pain, discharge from the wounds fever or feeling ill. Return if you feel that you may be developing an infection. You can go to an urgent care Center or to your primary care doctor in 1 week to get staples removed. Get your prescription for lisinopril filled immediately and start taking it as directed. Today's blood pressure was mildly elevated at 141/97. Get your blood pressure rechecked in a week when you get your staples removed.

## 2015-10-15 ENCOUNTER — Emergency Department (INDEPENDENT_AMBULATORY_CARE_PROVIDER_SITE_OTHER)
Admission: EM | Admit: 2015-10-15 | Discharge: 2015-10-15 | Disposition: A | Discharge status: Home / Self Care | Payer: Self-pay | Source: Home / Self Care | Attending: Emergency Medicine | Admitting: Emergency Medicine

## 2015-10-15 ENCOUNTER — Encounter (HOSPITAL_COMMUNITY): Payer: Self-pay

## 2015-10-15 DIAGNOSIS — Z4802 Encounter for removal of sutures: Secondary | ICD-10-CM

## 2015-10-15 NOTE — ED Provider Notes (Signed)
CSN: [id number]     Arrival date & time 10/15/15  1310 History   First MD Initiated Contact with Patient 10/15/15 1420     Chief Complaint  Patient presents with  . staple remover    (Consider location/radiation/quality/duration/timing/severity/associated sxs/prior Treatment) HPI  He is a 60 year old man here for staple removal. He sustained a laceration to the back of his head about a week ago. He states it has been healing well. He denies any pain or drainage. He states it does itch.  Past Medical History  Diagnosis Date  . Cancer [facility])     prostate  . Hypertension   . GERD (gastroesophageal reflux disease)    Past Surgical History  Procedure Laterality Date  . Rotator cuff repair Right 2003    with partial acromiectomy/plasty  . Neck surgery  11/2001    multilevel c spine diskectomy, decompression, arthrodesis with bone graft and plate placed.  Dr Rennis Harding  . Esophagogastroduodenoscopy N/A 03/27/2015    Procedure: ESOPHAGOGASTRODUODENOSCOPY (EGD);  Surgeon: Manus Gunning, MD;  Location: [place];  Service: Gastroenterology;  Laterality: N/A;   Family History  Problem Relation Age of Onset  . Ovarian cancer Mother   . Cancer - Colon Father   . Prostate cancer Father   . Brain cancer Sister   . Sickle cell anemia Brother    Social History  Substance Use Topics  . Smoking status: Never Smoker   . Smokeless tobacco: None  . Alcohol Use: Yes     Comment:  3 40oz/week    Review of Systems As in history of present illness Allergies  Review of patient's allergies indicates no known allergies.  Home Medications   Prior to Admission medications   Medication Sig Start Date End Date Taking? Authorizing Provider  aspirin EC 325 MG tablet Take 1 tablet (325 mg total) by mouth daily. 03/28/15   Bonnielee Haff, MD  lisinopril (PRINIVIL,ZESTRIL) 40 MG tablet Take 40 mg by mouth daily.    Historical Provider, MD  metoprolol tartrate (LOPRESSOR) 25 MG tablet Take 2  tablets (50 mg total) by mouth 2 (two) times daily. 03/28/15   Bonnielee Haff, MD  omeprazole (PRILOSEC) 20 MG capsule Take 20 mg by mouth daily.    Historical Provider, MD  PRESCRIPTION MEDICATION Take 1 tablet by mouth daily. Pt gets a chemo tablet from the [place] [facility] in [place]. Dose and name unknown    Historical Provider, MD  UNABLE TO FIND Mr. Ewart was admitted to [facility] from 03/26/15 till 03/28/15. He may return to work after 1 week. Please contact me if you have any questions.  Bonnielee Haff [facility] [phone] 03/28/15   Bonnielee Haff, MD   Meds Ordered and Administered this Visit  Medications - No data to display  BP 174/116 mmHg  Pulse 70  Temp(Src) 99.1 F (37.3 C) (Oral)  Resp 18  SpO2 100% No data found.   Physical Exam  Constitutional: He is oriented to person, place, and time. He appears well-developed and well-nourished. No distress.  Cardiovascular: Normal rate.   Pulmonary/Chest: Effort normal.  Neurological: He is alert and oriented to person, place, and time.  Skin:  Well-healed 5 cm laceration to back of head. There is still some surrounding swelling and bruising. 4 staples visualized.    ED Course  .Suture Removal Date/Time: 10/15/2015 2:39 PM Performed by: Melony Overly Authorized by: Melony Overly Consent: Verbal consent obtained. Risks and benefits: risks, benefits and alternatives were discussed  Consent given by: patient Patient understanding: patient states understanding of the procedure being performed Patient identity confirmed: verbally with patient Time out: Immediately prior to procedure a "time out" was called to verify the correct patient, procedure, equipment, support staff and site/side marked as required. Body area: head/neck Location details: scalp Wound Appearance: clean Staples Removed: 4 Patient tolerance: Patient tolerated the procedure well with no immediate complications   (including critical care  time)  Labs Review Labs Reviewed - No data to display  Imaging Review No results found.   MDM   1. Encounter for staple removal    4 staples removed without difficulty. Wound care discussed. Follow-up as needed.    Melony Overly, MD 10/15/15 1440

## 2015-10-15 NOTE — Discharge Instructions (Signed)
The cut has healed well. You do still have some swelling and bruising. Apply ice to help with the swelling. You can wash the cut gently with baby shampoo. Follow-up as needed.

## 2015-10-15 NOTE — ED Notes (Signed)
Patient here to have the staples to the back of his head removed Staples have been in since last saturday

## 2016-10-27 ENCOUNTER — Encounter (HOSPITAL_COMMUNITY): Payer: Self-pay | Admitting: Emergency Medicine

## 2016-10-27 ENCOUNTER — Ambulatory Visit (HOSPITAL_COMMUNITY)
Admission: EM | Admit: 2016-10-27 | Discharge: 2016-10-27 | Disposition: A | Discharge status: Home / Self Care | Payer: Non-veteran care | Attending: Family Medicine | Admitting: Family Medicine

## 2016-10-27 DIAGNOSIS — J302 Other seasonal allergic rhinitis: Secondary | ICD-10-CM

## 2016-10-27 DIAGNOSIS — R05 Cough: Secondary | ICD-10-CM

## 2016-10-27 DIAGNOSIS — R0982 Postnasal drip: Secondary | ICD-10-CM

## 2016-10-27 DIAGNOSIS — R053 Chronic cough: Secondary | ICD-10-CM

## 2016-10-27 NOTE — ED Provider Notes (Signed)
CSN: [id number]     Arrival date & time 10/27/16  1206 History   First MD Initiated Contact with Patient 10/27/16 1258     Chief Complaint  Patient presents with  . URI   (Consider location/radiation/quality/duration/timing/severity/associated sxs/prior Treatment) 61 year old male complaining of a cough for 3 weeks. It is worse at night when supine. Also having sneezing, general aching. Denies PND, fever or shortness of breath. He has taken Alka-Seltzer cold plus but is seemed to make it worse. Currently temperature 98.5.      Past Medical History:  Diagnosis Date  . Cancer [facility])    prostate  . GERD (gastroesophageal reflux disease)   . Hypertension    Past Surgical History:  Procedure Laterality Date  . ESOPHAGOGASTRODUODENOSCOPY N/A 03/27/2015   Procedure: ESOPHAGOGASTRODUODENOSCOPY (EGD);  Surgeon: Manus Gunning, MD;  Location: [place];  Service: Gastroenterology;  Laterality: N/A;  . NECK SURGERY  11/2001   multilevel c spine diskectomy, decompression, arthrodesis with bone graft and plate placed.  Dr Rennis Harding  . ROTATOR CUFF REPAIR Right 2003   with partial acromiectomy/plasty   Family History  Problem Relation Age of Onset  . Ovarian cancer Mother   . Cancer - Colon Father   . Prostate cancer Father   . Brain cancer Sister   . Sickle cell anemia Brother    Social History  Substance Use Topics  . Smoking status: Never Smoker  . Smokeless tobacco: Not on file  . Alcohol use Yes     Comment:  3 40oz/week    Review of Systems  Constitutional: Positive for activity change. Negative for chills, fatigue and fever.  HENT: Positive for postnasal drip, sore throat and trouble swallowing. Negative for ear pain, facial swelling and rhinorrhea.   Eyes: Negative for pain, discharge and redness.  Respiratory: Positive for cough. Negative for chest tightness, shortness of breath and wheezing.   Cardiovascular: Negative.   Gastrointestinal: Negative.    Musculoskeletal: Negative.  Negative for neck pain and neck stiffness.  Neurological: Negative.   All other systems reviewed and are negative.   Allergies  Patient has no known allergies.  Home Medications   Prior to Admission medications   Medication Sig Start Date End Date Taking? Authorizing Provider  Chlorphen-Phenyleph-ASA (ALKA-SELTZER PLUS COLD PO) Take by mouth. Day and Night   Yes Historical Provider, MD  aspirin EC 325 MG tablet Take 1 tablet (325 mg total) by mouth daily. 03/28/15   Bonnielee Haff, MD  omeprazole (PRILOSEC) 20 MG capsule Take 20 mg by mouth daily.    Historical Provider, MD  PRESCRIPTION MEDICATION Take 1 tablet by mouth daily. Pt gets a chemo tablet from the [place] [facility] in [place]. Dose and name unknown    Historical Provider, MD  UNABLE TO FIND Mr. Hollingshed was admitted to [facility] from 03/26/15 till 03/28/15. He may return to work after 1 week. Please contact me if you have any questions.  Bonnielee Haff [facility] [phone] 03/28/15   Bonnielee Haff, MD   Meds Ordered and Administered this Visit  Medications - No data to display  BP (!) 164/97 (BP Location: Left Arm)   Pulse 74   Temp 98.5 F (36.9 C) (Oral)   Resp 18   SpO2 98%  No data found.   Physical Exam  Constitutional: He is oriented to person, place, and time. He appears well-developed and well-nourished. No distress.  HENT:  Mouth/Throat: No oropharyngeal exudate.  Left TM is normal. Right TM is  obscured by cerumen. Oropharynx with erythema and copious amount of thick, frothy PND  Eyes: EOM are normal.  Neck: Normal range of motion. Neck supple.  Cardiovascular: Normal rate, regular rhythm, normal heart sounds and intact distal pulses.   Pulmonary/Chest: Effort normal and breath sounds normal. No respiratory distress. He has no wheezes. He has no rales.  Musculoskeletal: Normal range of motion. He exhibits no edema.  Lymphadenopathy:    He has no cervical  adenopathy.  Neurological: He is alert and oriented to person, place, and time.  Skin: Skin is warm and dry. No rash noted.  Psychiatric: He has a normal mood and affect.  Nursing note and vitals reviewed.   Urgent Care Course     Procedures (including critical care time)  Labs Review Labs Reviewed - No data to display  Imaging Review No results found.   Visual Acuity Review  Right Eye Distance:   Left Eye Distance:   Bilateral Distance:    Right Eye Near:   Left Eye Near:    Bilateral Near:         MDM   1. Seasonal allergic rhinitis, unspecified trigger   2. PND (post-nasal drip)   3. Persistent cough for 3 weeks or longer    There is quite a bit of drainage in the back of your throat along with redness. It does not appear to be infected however it appears to be more irritation due to the drainage. Allegra or Zyrtec daily as needed for drainage and runny nose. For stronger antihistamine may take Chlor-Trimeton 2 to 4 mg every 4 to 6 hours, may cause drowsiness. Saline nasal spray used frequently. Ibuprofen 600 mg every 6 hours as needed for pain, discomfort or fever. Drink plenty of fluids and stay well-hydrated. Flonase or Rhinocort nasal spray daily    Janne Napoleon, NP 10/27/16 1317

## 2016-10-27 NOTE — Discharge Instructions (Signed)
There is quite a bit of drainage in the back of your throat along with redness. It does not appear to be infected however it appears to be more irritation due to the drainage. Allegra or Zyrtec daily as needed for drainage and runny nose. For stronger antihistamine may take Chlor-Trimeton 2 to 4 mg every 4 to 6 hours, may cause drowsiness. Saline nasal spray used frequently. Ibuprofen 600 mg every 6 hours as needed for pain, discomfort or fever. Drink plenty of fluids and stay well-hydrated. Flonase or Rhinocort nasal spray daily

## 2016-10-27 NOTE — ED Triage Notes (Signed)
Onset 3 weeks ago of symptoms: general aching, sneezing, cough, this past week started coughing up yellow phlegm

## 2018-03-23 ENCOUNTER — Ambulatory Visit (HOSPITAL_COMMUNITY)
Admission: EM | Admit: 2018-03-23 | Discharge: 2018-03-23 | Disposition: A | Discharge status: Other Acute Inpatient Hospital | Payer: Non-veteran care

## 2018-03-25 ENCOUNTER — Encounter (HOSPITAL_COMMUNITY): Payer: Self-pay | Admitting: *Deleted

## 2018-03-25 ENCOUNTER — Emergency Department (HOSPITAL_COMMUNITY)
Admission: EM | Admit: 2018-03-25 | Discharge: 2018-03-25 | Disposition: A | Discharge status: Home / Self Care | Payer: Non-veteran care | Attending: Emergency Medicine | Admitting: Emergency Medicine

## 2018-03-25 ENCOUNTER — Other Ambulatory Visit: Payer: Self-pay

## 2018-03-25 DIAGNOSIS — M7918 Myalgia, other site: Secondary | ICD-10-CM | POA: Diagnosis not present

## 2018-03-25 DIAGNOSIS — Z79899 Other long term (current) drug therapy: Secondary | ICD-10-CM | POA: Insufficient documentation

## 2018-03-25 DIAGNOSIS — Z8546 Personal history of malignant neoplasm of prostate: Secondary | ICD-10-CM | POA: Diagnosis not present

## 2018-03-25 DIAGNOSIS — Z7982 Long term (current) use of aspirin: Secondary | ICD-10-CM | POA: Diagnosis not present

## 2018-03-25 DIAGNOSIS — I10 Essential (primary) hypertension: Secondary | ICD-10-CM | POA: Diagnosis not present

## 2018-03-25 DIAGNOSIS — Z09 Encounter for follow-up examination after completed treatment for conditions other than malignant neoplasm: Secondary | ICD-10-CM | POA: Diagnosis not present

## 2018-03-25 DIAGNOSIS — Z7689 Persons encountering health services in other specified circumstances: Secondary | ICD-10-CM | POA: Diagnosis not present

## 2018-03-25 NOTE — ED Provider Notes (Signed)
Patient placed in Quick Look pathway, seen and evaluated   Chief Complaint: fall   HPI:   Jeremy Hayden is a 62 y.o. male who presents to the ED s/p fall that occurred 4 days ago while getting out of the shower. Patient reports that he took care of his injuries at home but his work will not let him return until he has a work note.   ROS: skin: bruise right thigh  Physical Exam:  BP 127/83 (BP Location: Right Arm)   Pulse 98   Temp 98.4 F (36.9 C) (Oral)   Resp 17   SpO2 98%    Gen: No distress  Neuro: Awake and Alert  Skin: ecchymosis right lateral thigh.     Initiation of care has begun. The patient has been counseled on the process, plan, and necessity for staying for the completion/evaluation, and the remainder of the medical screening examination    Ashley Murrain, NP 03/25/18 [place]    Fredia Sorrow, MD 03/28/18 [phone]

## 2018-03-25 NOTE — ED Provider Notes (Signed)
[facility] EMERGENCY DEPARTMENT Provider Note   CSN: [id number] Arrival date & time: 03/25/18  1307     History   Chief Complaint Chief Complaint  Patient presents with  . Letter for School/Work  . Leg Pain    HPI Jeremy Hayden is a 62 y.o. male with a past medical history of hypertension, GERD, who presents to ED for a note stating that he is cleared to go back to work.  States that he had a mechanical fall that occurred 4 days ago.  He was rushing to get to work when he slipped in the bathtub and bruised his right upper leg.  Denies any other injuries.  States that he has been treating the bruise with alcohol which has helped.  Denies any head injury or loss of consciousness.  Denies any other complaints.  HPI  Past Medical History:  Diagnosis Date  . Cancer [facility])    prostate  . GERD (gastroesophageal reflux disease)   . Hypertension     Patient Active Problem List   Diagnosis Date Noted  . Hematemesis with nausea   . Dehydration 03/26/2015  . Chest pain 03/26/2015  . Gastroenteritis 03/26/2015  . Paroxysmal atrial fibrillation ([place]) 03/26/2015  . Blood in stool   . TINEA CAPITIS 07/01/2006  . HYPERLIPIDEMIA 07/01/2006  . Hypertension 07/01/2006  . GERD 07/01/2006  . [place] DISEASE, CERVICAL 07/01/2006    Past Surgical History:  Procedure Laterality Date  . ESOPHAGOGASTRODUODENOSCOPY N/A 03/27/2015   Procedure: ESOPHAGOGASTRODUODENOSCOPY (EGD);  Surgeon: Manus Gunning, MD;  Location: [facility];  Service: Gastroenterology;  Laterality: N/A;  . NECK SURGERY  11/2001   multilevel c spine diskectomy, decompression, arthrodesis with bone graft and plate placed.  Dr Rennis Harding  . ROTATOR CUFF REPAIR Right 2003   with partial acromiectomy/plasty        Home Medications    Prior to Admission medications   Medication Sig Start Date End Date Taking? Authorizing Provider  aspirin EC 325 MG tablet Take 1 tablet (325 mg total) by mouth  daily. 03/28/15   Bonnielee Haff, MD  Chlorphen-Phenyleph-ASA (ALKA-SELTZER PLUS COLD PO) Take by mouth. Day and Night    [provider]  omeprazole (PRILOSEC) 20 MG capsule Take 20 mg by mouth daily.    [provider]  PRESCRIPTION MEDICATION Take 1 tablet by mouth daily. Pt gets a chemo tablet from the [place] [facility] in [place]. Dose and name unknown    [provider]  UNABLE TO FIND [name] was admitted to [facility] from 03/26/15 till 03/28/15. He may return to work after 1 week. Please contact me if you have any questions.  Bonnielee Haff [facility] [phone] 03/28/15   Bonnielee Haff, MD    Family History Family History  Problem Relation Age of Onset  . Ovarian cancer Mother   . Cancer - Colon Father   . Prostate cancer Father   . Brain cancer Sister   . Sickle cell anemia Brother     Social History Social History   Tobacco Use  . Smoking status: Never Smoker  Substance Use Topics  . Alcohol use: Yes    Comment:  3 40oz/week  . Drug use: No     Allergies   Patient has no known allergies.   Review of Systems Review of Systems  Constitutional: Negative for chills and fever.  Musculoskeletal: Positive for myalgias.  Skin: Negative for wound.  Neurological: Negative for syncope, weakness, numbness and  headaches.     Physical Exam Updated Vital Signs BP 127/83 (BP Location: Right Arm)   Pulse 98   Temp 98.4 F (36.9 C) (Oral)   Resp 17   SpO2 98%   Physical Exam  Constitutional: He appears well-developed and well-nourished. No distress.  HENT:  Head: Normocephalic and atraumatic.  Eyes: Conjunctivae and EOM are normal. No scleral icterus.  Neck: Normal range of motion.  Pulmonary/Chest: Effort normal. No respiratory distress.  Neurological: He is alert.  Skin: No rash noted. He is not diaphoretic.  Psychiatric: He has a normal mood and affect.  Nursing note and vitals reviewed.    ED Treatments /  Results  Labs (all labs ordered are listed, but only abnormal results are displayed) Labs Reviewed - No data to display  EKG None  Radiology No results found.  Procedures Procedures (including critical care time)  Medications Ordered in ED Medications - No data to display   Initial Impression / Assessment and Plan / ED Course  I have reviewed the triage vital signs and the nursing notes.  Pertinent labs & imaging results that were available during my care of the patient were reviewed by me and considered in my medical decision making (see chart for details).     62 year old male presents to ED requesting work note and clearing him to go back to work.  He had a mechanical fall 4 days ago after slipping in the bathtub.  Denies any head injury or loss of consciousness.  Has been amatory with normal gait since incident occurred.  He has treated his right upper leg bruise with alcohol which has improved the symptoms.  I believe that patient is cleared to return to work.  Will advise him to return to ED for any severe worsening symptoms.  Portions of this note were generated with [employer]. Dictation errors may occur despite best attempts at proofreading.   Final Clinical Impressions(s) / ED Diagnoses   Final diagnoses:  Follow-up exam  Return to work evaluation    ED Discharge Orders    None       Delia Heady, [place] 03/25/18 [place], [place], DO 03/25/18 2120

## 2018-03-25 NOTE — ED Triage Notes (Signed)
Pt reports having a fall in the shower Saturday, denies syncope or loc. Only injury was bruise to right lateral thigh. Reports needing a work note to return to work.

## 2018-03-25 NOTE — Discharge Instructions (Signed)
Return to ED for worsening symptoms, additional injuries or falls, numbness in arms or legs, headache or vision changes. °

## 2018-05-19 ENCOUNTER — Encounter (HOSPITAL_COMMUNITY): Payer: Self-pay

## 2018-05-19 ENCOUNTER — Other Ambulatory Visit: Payer: Self-pay

## 2018-05-19 ENCOUNTER — Ambulatory Visit (HOSPITAL_COMMUNITY)
Admission: EM | Admit: 2018-05-19 | Discharge: 2018-05-19 | Disposition: A | Discharge status: Other Acute Inpatient Hospital | Payer: Non-veteran care

## 2018-05-19 NOTE — ED Triage Notes (Signed)
Per provider pt to go to ED for eval

## 2018-05-19 NOTE — ED Triage Notes (Signed)
Pt cc states he black out this morning for about what felt like 5 minutes and busted his top lip. ( This happened another time 3 months ago.)

## 2018-09-15 ENCOUNTER — Encounter (HOSPITAL_COMMUNITY): Payer: Self-pay | Admitting: *Deleted

## 2018-09-15 ENCOUNTER — Other Ambulatory Visit: Payer: Self-pay

## 2018-09-15 ENCOUNTER — Emergency Department (HOSPITAL_COMMUNITY): Payer: No Typology Code available for payment source

## 2018-09-15 ENCOUNTER — Emergency Department (HOSPITAL_COMMUNITY)
Admission: EM | Admit: 2018-09-15 | Discharge: 2018-09-15 | Disposition: A | Discharge status: Home / Self Care | Payer: No Typology Code available for payment source | Attending: Emergency Medicine | Admitting: Emergency Medicine

## 2018-09-15 DIAGNOSIS — Z79899 Other long term (current) drug therapy: Secondary | ICD-10-CM | POA: Diagnosis not present

## 2018-09-15 DIAGNOSIS — Z7982 Long term (current) use of aspirin: Secondary | ICD-10-CM | POA: Insufficient documentation

## 2018-09-15 DIAGNOSIS — Z8546 Personal history of malignant neoplasm of prostate: Secondary | ICD-10-CM | POA: Insufficient documentation

## 2018-09-15 DIAGNOSIS — I10 Essential (primary) hypertension: Secondary | ICD-10-CM | POA: Diagnosis not present

## 2018-09-15 DIAGNOSIS — R55 Syncope and collapse: Secondary | ICD-10-CM

## 2018-09-15 LAB — CBC
HCT: 44.9 % (ref 39.0–52.0)
Hemoglobin: 15.7 g/dL (ref 13.0–17.0)
MCH: 35.3 pg — AB (ref 26.0–34.0)
MCHC: 35 g/dL (ref 30.0–36.0)
MCV: 100.9 fL — AB (ref 80.0–100.0)
NRBC: 0 % (ref 0.0–0.2)
Platelets: 184 10*3/uL (ref 150–400)
RBC: 4.45 MIL/uL (ref 4.22–5.81)
RDW: 11.5 % (ref 11.5–15.5)
WBC: 3.7 10*3/uL — ABNORMAL LOW (ref 4.0–10.5)

## 2018-09-15 LAB — URINALYSIS, ROUTINE W REFLEX MICROSCOPIC
Bilirubin Urine: NEGATIVE
GLUCOSE, UA: NEGATIVE mg/dL
HGB URINE DIPSTICK: NEGATIVE
Ketones, ur: 5 mg/dL — AB
Leukocytes,Ua: NEGATIVE
Nitrite: NEGATIVE
PH: 5 (ref 5.0–8.0)
PROTEIN: NEGATIVE mg/dL
Specific Gravity, Urine: 1.017 (ref 1.005–1.030)

## 2018-09-15 LAB — BASIC METABOLIC PANEL
Anion gap: 11 (ref 5–15)
BUN: 13 mg/dL (ref 8–23)
CALCIUM: 9.5 mg/dL (ref 8.9–10.3)
CHLORIDE: 105 mmol/L (ref 98–111)
CO2: 22 mmol/L (ref 22–32)
CREATININE: 0.93 mg/dL (ref 0.61–1.24)
GFR calc non Af Amer: >60 mL/min (ref >60)
Glucose, Bld: 87 mg/dL (ref 70–99)
Potassium: 3.5 mmol/L (ref 3.5–5.1)
Sodium: 138 mmol/L (ref 135–145)

## 2018-09-15 LAB — I-STAT TROPONIN, ED: TROPONIN I, POC: 0 ng/mL (ref 0.00–0.08)

## 2018-09-15 MED ORDER — ONDANSETRON HCL 4 MG/2ML IJ SOLN
4.0000 mg | Freq: Once | INTRAMUSCULAR | Status: AC
  Administered 2018-09-15: 4 mg via INTRAVENOUS
  Filled 2018-09-15: qty 2

## 2018-09-15 MED ORDER — SODIUM CHLORIDE 0.9% FLUSH: 3.0000 mL | Freq: Once | INTRAVENOUS | Status: DC

## 2018-09-15 NOTE — ED Triage Notes (Addendum)
Pt states he passed out yesterday, this morning vomiting and dizziness. Pt ambulatory to triage with steady gait. Rt hip pain from fall.

## 2018-09-15 NOTE — Discharge Instructions (Addendum)
Please return for any problem.   Follow up with your regular care providers as instructed.  

## 2018-09-15 NOTE — ED Provider Notes (Signed)
[facility] DEPT Provider Note   CSN: [id number] Arrival date & time: 09/15/18  1001    History   Chief Complaint Chief Complaint  Patient presents with  . Near Syncope  . Dizziness  . Emesis    HPI Jeremy Hayden is a 63 y.o. male.     63 year old male with prior medical history as detailed below presents for evaluation following reported syncopal event.  Patient reports that yesterday morning he was urinating.  Immediately after he finished urinating he passed out.  He reports that he had a brief feeling of feeling lightheaded and then was on the floor.  He is unsure exactly how long he was unconscious for.  He reports prior episodes of similar syncopal events in the past.  These have been associated with urination.  He complains of mild right-sided pain to his hip after the fall.  He is able to ambulate without difficulty.  He contacted his regular care providers at the [place] who advised him to come to the ED for evaluation.  He denies associated or current chest pain or shortness of breath.  He denies fever.  He denies headache, vision change, neck pain, or other acute complaint.  The history is provided by the patient and medical records.  Near Syncope  This is a recurrent problem. The current episode started yesterday. The problem occurs rarely. The problem has not changed since onset.Nothing aggravates the symptoms. Nothing relieves the symptoms. He has tried nothing for the symptoms.    Past Medical History:  Diagnosis Date  . Cancer [facility])    prostate  . GERD (gastroesophageal reflux disease)   . Hypertension     Patient Active Problem List   Diagnosis Date Noted  . Hematemesis with nausea   . Dehydration 03/26/2015  . Chest pain 03/26/2015  . Gastroenteritis 03/26/2015  . Paroxysmal atrial fibrillation ([place]) 03/26/2015  . Blood in stool   . TINEA CAPITIS 07/01/2006  . HYPERLIPIDEMIA 07/01/2006  . Hypertension 07/01/2006  . GERD  07/01/2006  . [place] DISEASE, CERVICAL 07/01/2006    Past Surgical History:  Procedure Laterality Date  . ESOPHAGOGASTRODUODENOSCOPY N/A 03/27/2015   Procedure: ESOPHAGOGASTRODUODENOSCOPY (EGD);  Surgeon: Manus Gunning, MD;  Location: [facility];  Service: Gastroenterology;  Laterality: N/A;  . NECK SURGERY  11/2001   multilevel c spine diskectomy, decompression, arthrodesis with bone graft and plate placed.  Dr Rennis Harding  . PROSTATE SURGERY    . ROTATOR CUFF REPAIR Right 2003   with partial acromiectomy/plasty        Home Medications    Prior to Admission medications   Medication Sig Start Date End Date Taking? Authorizing Provider  aspirin EC 325 MG tablet Take 1 tablet (325 mg total) by mouth daily. 03/28/15   Bonnielee Haff, MD  Chlorphen-Phenyleph-ASA (ALKA-SELTZER PLUS COLD PO) Take by mouth. Day and Night    [provider]  omeprazole (PRILOSEC) 20 MG capsule Take 20 mg by mouth daily.    [provider]  PRESCRIPTION MEDICATION Take 1 tablet by mouth daily. Pt gets a chemo tablet from the [place] center in [place]. Dose and name unknown    [provider]  UNABLE TO FIND Mr. Rosengrant was admitted to [facility] from 03/26/15 till 03/28/15. He may return to work after 1 week. Please contact me if you have any questions.  Bonnielee Haff [facility] [phone] 03/28/15   Bonnielee Haff, MD    Family History Family History  Problem  Relation Age of Onset  . Ovarian cancer Mother   . Cancer - Colon Father   . Prostate cancer Father   . Brain cancer Sister   . Sickle cell anemia Brother     Social History Social History   Tobacco Use  . Smoking status: Never Smoker  . Smokeless tobacco: Never Used  Substance Use Topics  . Alcohol use: Not Currently    Comment:  3 40oz/week  . Drug use: No     Allergies   Patient has no known allergies.   Review of Systems Review of Systems  Cardiovascular: Positive for  near-syncope.  All other systems reviewed and are negative.    Physical Exam Updated Vital Signs BP (!) 133/91 (BP Location: Left Arm)   Pulse 76   Temp 98.8 F (37.1 C) (Oral)   Resp 16   Ht 5\' 6"  (1.676 m)   Wt 64.4 kg   SpO2 100%   BMI 22.92 kg/m   Physical Exam Vitals signs and nursing note reviewed.  Constitutional:      General: He is not in acute distress.    Appearance: He is well-developed.  HENT:     Head: Normocephalic and atraumatic.  Eyes:     Conjunctiva/sclera: Conjunctivae normal.     Pupils: Pupils are equal, round, and reactive to light.  Neck:     Musculoskeletal: Normal range of motion and neck supple.  Cardiovascular:     Rate and Rhythm: Normal rate and regular rhythm.     Heart sounds: Normal heart sounds.  Pulmonary:     Effort: Pulmonary effort is normal. No respiratory distress.     Breath sounds: Normal breath sounds.  Abdominal:     General: There is no distension.     Palpations: Abdomen is soft.     Tenderness: There is no abdominal tenderness.  Musculoskeletal: Normal range of motion.        General: No deformity.     Comments: Mild tenderness with palpation to the anterior lateral aspect of the right hip.  Full active range of motion of the right hip noted.  Patient is ambulatory with no antalgic gait.  Skin:    General: Skin is warm and dry.  Neurological:     Mental Status: He is alert and oriented to person, place, and time.      ED Treatments / Results  Labs (all labs ordered are listed, but only abnormal results are displayed) Labs Reviewed  CBC - Abnormal; Notable for the following components:      Result Value   WBC 3.7 (*)    MCV 100.9 (*)    MCH 35.3 (*)    All other components within normal limits  URINALYSIS, ROUTINE W REFLEX MICROSCOPIC - Abnormal; Notable for the following components:   Ketones, ur 5 (*)    All other components within normal limits  BASIC METABOLIC PANEL  I-STAT TROPONIN, ED  CBG  MONITORING, ED    EKG EKG Interpretation  Date/Time:  Tuesday September 15 2018 10:22:22 EST Ventricular Rate:  103 PR Interval:    QRS Duration: 93 QT Interval:  361 QTC Calculation: 473 R Axis:   -60 Text Interpretation:  Sinus tachycardia Left anterior fascicular block Anterior infarct, old Confirmed by Dene Gentry [phone]) on 09/15/2018 10:28:47 AM Also confirmed by Dene Gentry [phone]), editor Philomena Doheny [phone])  on 09/15/2018 10:39:45 AM   Radiology Dg Chest 2 View  Result Date: 09/15/2018 CLINICAL DATA:  Recent syncopal episode  EXAM: CHEST - 2 VIEW COMPARISON:  03/26/2015 FINDINGS: The heart size and mediastinal contours are within normal limits. Both lungs are clear. The visualized skeletal structures are unremarkable. IMPRESSION: No acute abnormality noted. Electronically Signed   By: Inez Catalina M.D.   On: 09/15/2018 11:41   Dg Hip Unilat W Or Wo Pelvis 2-3 Views Right  Result Date: 09/15/2018 CLINICAL DATA:  Recent syncopal episode and hip pain, initial encounter EXAM: DG HIP (WITH OR WITHOUT PELVIS) 2-3V RIGHT COMPARISON:  None. FINDINGS: There is no evidence of hip fracture or dislocation. There is no evidence of arthropathy or other focal bone abnormality. IMPRESSION: No acute abnormality noted.  No Electronically Signed   By: Inez Catalina M.D.   On: 09/15/2018 11:38    Procedures Procedures (including critical care time)  Medications Ordered in ED Medications  sodium chloride flush (NS) 0.9 % injection 3 mL (has no administration in time range)  ondansetron (ZOFRAN) injection 4 mg (4 mg Intravenous Given 09/15/18 1105)     Initial Impression / Assessment and Plan / ED Course  I have reviewed the triage vital signs and the nursing notes.  Pertinent labs & imaging results that were available during my care of the patient were reviewed by me and considered in my medical decision making (see chart for details).        MDM  Screen complete  Patient is presenting  for evaluation of a near syncopal or syncopal event.  Patient's presentation is consistent with likely post micturition syncope.  This event occurred more than 24 hours prior to his evaluation today.  He appears to be minimally symptomatic over the last 24 hours.  Screening labs obtained in the ED are without significant abnormality.  CT imaging does not show acute pathology.  Baseline labs are without significant abnormality.  Patient does feel comfortable and desires discharge.  He does understand the need for close follow-up with his primary care providers at the [facility] [place].  Strict return precautions given and understood.  Final Clinical Impressions(s) / ED Diagnoses   Final diagnoses:  Micturition syncope    ED Discharge Orders    None       Valarie Merino, MD 09/15/18 1218

## 2019-01-11 ENCOUNTER — Encounter (HOSPITAL_COMMUNITY): Payer: Self-pay

## 2019-01-11 ENCOUNTER — Emergency Department (HOSPITAL_COMMUNITY)
Admission: EM | Admit: 2019-01-11 | Discharge: 2019-01-11 | Discharge status: Against Medical Advice | Payer: No Typology Code available for payment source | Attending: Emergency Medicine | Admitting: Emergency Medicine

## 2019-01-11 ENCOUNTER — Other Ambulatory Visit: Payer: Self-pay

## 2019-01-11 DIAGNOSIS — R1031 Right lower quadrant pain: Secondary | ICD-10-CM | POA: Diagnosis present

## 2019-01-11 DIAGNOSIS — Z5321 Procedure and treatment not carried out due to patient leaving prior to being seen by health care provider: Secondary | ICD-10-CM | POA: Diagnosis not present

## 2019-01-11 LAB — URINALYSIS, ROUTINE W REFLEX MICROSCOPIC
Bacteria, UA: NONE SEEN
Bilirubin Urine: NEGATIVE
Glucose, UA: NEGATIVE mg/dL
Hgb urine dipstick: NEGATIVE
Ketones, ur: NEGATIVE mg/dL
Leukocytes,Ua: NEGATIVE
Nitrite: NEGATIVE
Protein, ur: 30 mg/dL — AB
Specific Gravity, Urine: 1.03 (ref 1.005–1.030)
pH: 5 (ref 5.0–8.0)

## 2019-01-11 NOTE — ED Triage Notes (Signed)
Pt reports he had is prostate removed in Feb of this year and today while working he bent over and had a sharp pain on the right side of groin. Pt states it is now swollen.

## 2019-01-18 ENCOUNTER — Encounter (HOSPITAL_COMMUNITY): Payer: Self-pay | Admitting: Emergency Medicine

## 2019-01-18 ENCOUNTER — Other Ambulatory Visit: Payer: Self-pay

## 2019-01-18 ENCOUNTER — Emergency Department (HOSPITAL_COMMUNITY): Payer: No Typology Code available for payment source

## 2019-01-18 ENCOUNTER — Emergency Department (HOSPITAL_COMMUNITY)
Admission: EM | Admit: 2019-01-18 | Discharge: 2019-01-18 | Disposition: A | Discharge status: Home / Self Care | Payer: No Typology Code available for payment source | Attending: Emergency Medicine | Admitting: Emergency Medicine

## 2019-01-18 DIAGNOSIS — R1031 Right lower quadrant pain: Secondary | ICD-10-CM | POA: Diagnosis not present

## 2019-01-18 DIAGNOSIS — I10 Essential (primary) hypertension: Secondary | ICD-10-CM | POA: Insufficient documentation

## 2019-01-18 DIAGNOSIS — Z7982 Long term (current) use of aspirin: Secondary | ICD-10-CM | POA: Diagnosis not present

## 2019-01-18 DIAGNOSIS — Z79899 Other long term (current) drug therapy: Secondary | ICD-10-CM | POA: Insufficient documentation

## 2019-01-18 DIAGNOSIS — Z8546 Personal history of malignant neoplasm of prostate: Secondary | ICD-10-CM | POA: Diagnosis not present

## 2019-01-18 LAB — COMPREHENSIVE METABOLIC PANEL
ALT: 33 U/L (ref 0–44)
AST: 53 U/L — ABNORMAL HIGH (ref 15–41)
Albumin: 3.4 g/dL — ABNORMAL LOW (ref 3.5–5.0)
Alkaline Phosphatase: 48 U/L (ref 38–126)
Anion gap: 7 (ref 5–15)
BUN: 16 mg/dL (ref 8–23)
CO2: 22 mmol/L (ref 22–32)
Calcium: 9.3 mg/dL (ref 8.9–10.3)
Chloride: 113 mmol/L — ABNORMAL HIGH (ref 98–111)
Creatinine, Ser: 1.06 mg/dL (ref 0.61–1.24)
GFR calc Af Amer: >60 mL/min (ref >60)
GFR calc non Af Amer: >60 mL/min (ref >60)
Glucose, Bld: 98 mg/dL (ref 70–99)
Potassium: 4.4 mmol/L (ref 3.5–5.1)
Sodium: 142 mmol/L (ref 135–145)
Total Bilirubin: 0.9 mg/dL (ref 0.3–1.2)
Total Protein: 6.4 g/dL — ABNORMAL LOW (ref 6.5–8.1)

## 2019-01-18 LAB — URINALYSIS, ROUTINE W REFLEX MICROSCOPIC
Bilirubin Urine: NEGATIVE
Glucose, UA: NEGATIVE mg/dL
Hgb urine dipstick: NEGATIVE
Ketones, ur: NEGATIVE mg/dL
Leukocytes,Ua: NEGATIVE
Nitrite: NEGATIVE
Protein, ur: NEGATIVE mg/dL
Specific Gravity, Urine: 1.021 (ref 1.005–1.030)
pH: 5 (ref 5.0–8.0)

## 2019-01-18 LAB — CBC WITH DIFFERENTIAL/PLATELET
Abs Immature Granulocytes: 0.01 10*3/uL (ref 0.00–0.07)
Basophils Absolute: 0 10*3/uL (ref 0.0–0.1)
Basophils Relative: 0 %
Eosinophils Absolute: 0 10*3/uL (ref 0.0–0.5)
Eosinophils Relative: 1 %
HCT: 42.1 % (ref 39.0–52.0)
Hemoglobin: 14.4 g/dL (ref 13.0–17.0)
Immature Granulocytes: 0 %
Lymphocytes Relative: 24 %
Lymphs Abs: 0.7 10*3/uL (ref 0.7–4.0)
MCH: 34 pg (ref 26.0–34.0)
MCHC: 34.2 g/dL (ref 30.0–36.0)
MCV: 99.3 fL (ref 80.0–100.0)
Monocytes Absolute: 0.4 10*3/uL (ref 0.1–1.0)
Monocytes Relative: 14 %
Neutro Abs: 1.7 10*3/uL (ref 1.7–7.7)
Neutrophils Relative %: 61 %
Platelets: 230 10*3/uL (ref 150–400)
RBC: 4.24 MIL/uL (ref 4.22–5.81)
RDW: 12.5 % (ref 11.5–15.5)
WBC: 2.8 10*3/uL — ABNORMAL LOW (ref 4.0–10.5)
nRBC: 0 % (ref 0.0–0.2)

## 2019-01-18 MED ORDER — ACETAMINOPHEN 500 MG PO TABS
1000.0000 mg | ORAL_TABLET | Freq: Once | ORAL | Status: AC
  Administered 2019-01-18: 12:00:00 1000 mg via ORAL
  Filled 2019-01-18: qty 2

## 2019-01-18 MED ORDER — IOHEXOL 300 MG/ML  SOLN
100.0000 mL | Freq: Once | INTRAMUSCULAR | Status: AC | PRN
  Administered 2019-01-18: 100 mL via INTRAVENOUS

## 2019-01-18 NOTE — ED Notes (Signed)
Pt verbalized understanding of d/c instructions and has no further questions, VSS, NAD.  

## 2019-01-18 NOTE — Discharge Instructions (Signed)
As discussed, today's evaluation has been generally reassuring. You have been diagnosed with abdominal pain, likely due to an inguinal hernia. Please schedule follow-up with our surgical colleagues, or return here for concerning changes in your condition. Tylenol, 500 mg, taken 3 times daily as appropriate for pain control. In addition, you may consider obtaining compression shorts for additional symptomatic relief while working.

## 2019-01-18 NOTE — ED Triage Notes (Signed)
Patient arrives POV c/o right sided groin pain and swelling onset of last Thursday. Patient reports prostate removal Feb. States he came here last week but left due to wait time. Patient states that his urine was darker in color when he last came but has cleared up since.

## 2019-01-18 NOTE — ED Provider Notes (Signed)
[facility] EMERGENCY DEPARTMENT Provider Note   CSN: [id number] Arrival date & time: 01/18/19  1138    History   Chief Complaint Chief Complaint  Patient presents with  . Groin Pain    HPI Jeremy Hayden is a 63 y.o. male.     HPI Patient presents with concern of right inguinal pain and swelling. Pain began about 4 days ago, without clear precipitant. Patient has a notable history of prostatectomy earlier this year. He notes that he had been recovering generally well until last week. Onset was over a day or so, with associated dysuria and discolored urination. No fever, vomiting, weight loss, other pain. Since onset pain is been controlled with Tylenol and other OTC medication.  Past Medical History:  Diagnosis Date  . Cancer [facility])    prostate  . GERD (gastroesophageal reflux disease)   . Hypertension     Patient Active Problem List   Diagnosis Date Noted  . Hematemesis with nausea   . Dehydration 03/26/2015  . Chest pain 03/26/2015  . Gastroenteritis 03/26/2015  . Paroxysmal atrial fibrillation ([place]) 03/26/2015  . Blood in stool   . TINEA CAPITIS 07/01/2006  . HYPERLIPIDEMIA 07/01/2006  . Hypertension 07/01/2006  . GERD 07/01/2006  . [place] DISEASE, CERVICAL 07/01/2006    Past Surgical History:  Procedure Laterality Date  . ESOPHAGOGASTRODUODENOSCOPY N/A 03/27/2015   Procedure: ESOPHAGOGASTRODUODENOSCOPY (EGD);  Surgeon: Manus Gunning, MD;  Location: [facility];  Service: Gastroenterology;  Laterality: N/A;  . NECK SURGERY  11/2001   multilevel c spine diskectomy, decompression, arthrodesis with bone graft and plate placed.  Dr Rennis Harding  . PROSTATE SURGERY    . ROTATOR CUFF REPAIR Right 2003   with partial acromiectomy/plasty        Home Medications    Prior to Admission medications   Medication Sig Start Date End Date Taking? Authorizing Provider  aspirin EC 325 MG tablet Take 1 tablet (325 mg total) by mouth daily.  03/28/15   Bonnielee Haff, MD  Chlorphen-Phenyleph-ASA (ALKA-SELTZER PLUS COLD PO) Take by mouth. Day and Night    [provider]  omeprazole (PRILOSEC) 20 MG capsule Take 20 mg by mouth daily.    [provider]  PRESCRIPTION MEDICATION Take 1 tablet by mouth daily. Pt gets a chemo tablet from the [place] [facility] in [place]. Dose and name unknown    [provider]  UNABLE TO FIND Mr. Weber was admitted to [facility] from 03/26/15 till 03/28/15. He may return to work after 1 week. Please contact me if you have any questions.  Bonnielee Haff [facility] [phone] 03/28/15   Bonnielee Haff, MD    Family History Family History  Problem Relation Age of Onset  . Ovarian cancer Mother   . Cancer - Colon Father   . Prostate cancer Father   . Brain cancer Sister   . Sickle cell anemia Brother     Social History Social History   Tobacco Use  . Smoking status: Never Smoker  . Smokeless tobacco: Never Used  Substance Use Topics  . Alcohol use: Not Currently    Comment:  3 40oz/week  . Drug use: No     Allergies   Patient has no known allergies.   Review of Systems Review of Systems  Constitutional:       Per HPI, otherwise negative  HENT:       Per HPI, otherwise negative  Respiratory:       Per  HPI, otherwise negative  Cardiovascular:       Per HPI, otherwise negative  Gastrointestinal: Negative for vomiting.  Endocrine:       Negative aside from HPI  Genitourinary:       Neg aside from HPI   Musculoskeletal:       Per HPI, otherwise negative  Skin: Negative.   Neurological: Negative for syncope.     Physical Exam Updated Vital Signs BP (!) 147/95   Pulse 66   Temp 98.5 F (36.9 C) (Oral)   Resp 14   Ht 5\' 6"  (1.676 m)   Wt 73.5 kg   SpO2 97%   BMI 26.15 kg/m   Physical Exam Vitals signs and nursing note reviewed.  Constitutional:      General: He is not in acute distress.    Appearance: He is  well-developed.  HENT:     Head: Normocephalic and atraumatic.  Eyes:     Conjunctiva/sclera: Conjunctivae normal.  Cardiovascular:     Rate and Rhythm: Normal rate and regular rhythm.  Pulmonary:     Effort: Pulmonary effort is normal. No respiratory distress.     Breath sounds: No stridor.  Abdominal:     General: There is no distension.  Genitourinary:   Skin:    General: Skin is warm and dry.  Neurological:     Mental Status: He is alert and oriented to person, place, and time.      ED Treatments / Results  Labs (all labs ordered are listed, but only abnormal results are displayed) Labs Reviewed  COMPREHENSIVE METABOLIC PANEL - Abnormal; Notable for the following components:      Result Value   Chloride 113 (*)    Total Protein 6.4 (*)    Albumin 3.4 (*)    AST 53 (*)    All other components within normal limits  CBC WITH DIFFERENTIAL/PLATELET - Abnormal; Notable for the following components:   WBC 2.8 (*)    All other components within normal limits  URINALYSIS, ROUTINE W REFLEX MICROSCOPIC    EKG None  Radiology Ct Abdomen Pelvis W Contrast  Result Date: 01/18/2019 CLINICAL DATA:  RIGHT lower abdominal pain. History of prostatectomy in February. EXAM: CT ABDOMEN AND PELVIS WITH CONTRAST TECHNIQUE: Multidetector CT imaging of the abdomen and pelvis was performed using the standard protocol following bolus administration of intravenous contrast. CONTRAST:  129mL OMNIPAQUE IOHEXOL 300 MG/ML  SOLN COMPARISON:  CT abdomen dated 03/26/2015. FINDINGS: Lower chest: No acute abnormality. Small hiatal hernia. Thickening of the walls of the distal esophagus, suggesting reflux. Hepatobiliary: Liver is diffusely low in density indicating fatty infiltration. Gallbladder is unremarkable. No bile duct dilatation seen. Pancreas: Unremarkable. No pancreatic ductal dilatation or surrounding inflammatory changes. Spleen: Normal in size without focal abnormality. Adrenals/Urinary Tract:  Adrenal glands appear normal. Kidneys are unremarkable without mass, stone or hydronephrosis. No ureteral or bladder calculi identified. Bladder is unremarkable, partially decompressed. Stomach/Bowel: No dilated large or small bowel loops. Scattered diverticulosis but no focal inflammatory change to suggest acute diverticulitis. Appendix is normal. Small hiatal hernia. Vascular/Lymphatic: No significant vascular findings are present. No enlarged abdominal or pelvic lymph nodes. Reproductive: Status post prostatectomy. No evidence of surgical complicating feature. Other: Small RIGHT inguinal hernia which contains fat only. No free fluid or abscess collection seen. No free intraperitoneal air. Musculoskeletal: No acute or suspicious osseous finding. IMPRESSION: 1. No acute findings. No bowel obstruction or evidence of bowel wall inflammation. No evidence of acute solid organ abnormality. No  renal or ureteral calculi. Appendix is normal. No evidence of surgical complicating feature status post prostatectomy. 2. Fatty infiltration of the liver. 3. Colonic diverticulosis without evidence of acute diverticulitis. 4. Small hiatal hernia. Thickening of the walls of the distal esophagus suggests associated gastroesophageal reflux. Electronically Signed   By: Franki Cabot M.D.   On: 01/18/2019 14:08    Procedures Procedures (including critical care time)  Medications Ordered in ED Medications  acetaminophen (TYLENOL) tablet 1,000 mg (1,000 mg Oral Given 01/18/19 1227)  iohexol (OMNIPAQUE) 300 MG/ML solution 100 mL (100 mLs Intravenous Contrast Given 01/18/19 1351)     Initial Impression / Assessment and Plan / ED Course  I have reviewed the triage vital signs and the nursing notes.  Pertinent labs & imaging results that were available during my care of the patient were reviewed by me and considered in my medical decision making (see chart for details).        3:08 PM 3:08 PM Patient in no distress, awake,  alert, sitting upright watching television per I discussed all findings including evidence for right inguinal hernia, no evidence for postsurgical infection, hematoma, fluids. Labs reassuring as well. Patient understands importance of following up with surgery for discussion of elective repair, patient discharged in stable condition.  Final Clinical Impressions(s) / ED Diagnoses   Final diagnoses:  Right lower quadrant abdominal pain     Carmin Muskrat, MD 01/18/19 1513

## 2019-07-31 ENCOUNTER — Ambulatory Visit (HOSPITAL_COMMUNITY)
Admission: EM | Admit: 2019-07-31 | Discharge: 2019-07-31 | Disposition: A | Discharge status: Home / Self Care | Payer: No Typology Code available for payment source | Attending: Family Medicine | Admitting: Family Medicine

## 2019-07-31 ENCOUNTER — Encounter (HOSPITAL_COMMUNITY): Payer: Self-pay | Admitting: Emergency Medicine

## 2019-07-31 ENCOUNTER — Other Ambulatory Visit: Payer: Self-pay

## 2019-07-31 DIAGNOSIS — K4090 Unilateral inguinal hernia, without obstruction or gangrene, not specified as recurrent: Secondary | ICD-10-CM

## 2019-07-31 MED ORDER — ACETAMINOPHEN-CODEINE #3 300-30 MG PO TABS: 1.0000 | ORAL_TABLET | Freq: Four times a day (QID) | ORAL | 0 refills | Status: DC | PRN

## 2019-07-31 NOTE — Discharge Instructions (Signed)
You have been seen today for abdominal pain. Your evaluation was not suggestive of any emergent condition requiring medical intervention at this time. However, some problems make take more time to appear. Therefore, it is very important for you to pay attention to any new symptoms or worsening of your current condition.  Please return here or to the Emergency Department immediately should you begin to feel worse in any way or have any of the following symptoms: sudden pain that quickly gets worse, vomiting, fever, or inability to move your bowels.

## 2019-07-31 NOTE — ED Triage Notes (Signed)
Patient has right lower quadrant/groin pain.  Patient reports he has been evaluated by surgeon at [facility], but covid has slowed the process of definitive treatment.   Pain worsened Friday .

## 2019-08-02 NOTE — ED Provider Notes (Signed)
[place]   [id number] 07/31/19 Arrival Time: 1217  ASSESSMENT & PLAN:  1. Inguinal hernia of right side without obstruction or gangrene     Benign exam. No signs of strangulated hernia. Is waiting for [facility] surgeon to repair. May f/u here as needed. No indications for urgent abdominal/pelvic imaging at this time. Discussed.   Meds ordered this encounter  Medications   acetaminophen-codeine (TYLENOL #3) 300-30 MG tablet    Sig: Take 1-2 tablets by mouth every 6 (six) hours as needed for moderate pain.    Dispense:  15 tablet    Refill:  0     Discharge Instructions     You have been seen today for abdominal pain. Your evaluation was not suggestive of any emergent condition requiring medical intervention at this time. However, some problems make take more time to appear. Therefore, it is very important for you to pay attention to any new symptoms or worsening of your current condition.  Please return here or to the Emergency Department immediately should you begin to feel worse in any way or have any of the following symptoms: sudden pain that quickly gets worse, vomiting, fever, or inability to move your bowels.       Follow-up Information    [place].   Specialty: Emergency Medicine Why: If symptoms worsen in any way. Contact information: [address] [id number] [place] [phone]           Reviewed expectations re: course of current medical issues. Questions answered. Outlined signs and symptoms indicating need for more acute intervention. Patient verbalized understanding. After Visit Summary given.   SUBJECTIVE: History from: patient. Jeremy Hayden is a 64 y.o. male who presents with complaint of "hernia pain". Has been seen at [facility] and is scheduled for surgery. Was given Tyl #3 to use prn when hernia becomes painful. Requests refill. Noticed more pain 1-2 d ago "and  more swollen around hernia but the swelling has gone down". Pain is less but still bothers him occasionally. No other abd pain. Normal bowel/bladder habits. Afebrile. Ambulatory without difficulty. No specific aggravating or alleviating factors reported.    Past Surgical History:  Procedure Laterality Date   ESOPHAGOGASTRODUODENOSCOPY N/A 03/27/2015   Procedure: ESOPHAGOGASTRODUODENOSCOPY (EGD);  Surgeon: Manus Gunning, MD;  Location: [place];  Service: Gastroenterology;  Laterality: N/A;   NECK SURGERY  11/2001   multilevel c spine diskectomy, decompression, arthrodesis with bone graft and plate placed.  Dr Rennis Harding   PROSTATE SURGERY     ROTATOR CUFF REPAIR Right 2003   with partial acromiectomy/plasty     OBJECTIVE:  Vitals:   07/31/19 1255  BP: 137/84  Pulse: 96  Resp: (!) 22  Temp: 98.4 F (36.9 C)  TempSrc: Oral  SpO2: 99%    General appearance: alert, oriented, no acute distress HEENT: [place]; AT; oropharynx moist Lungs: clear to auscultation bilaterally; unlabored respirations Heart: regular rate and rhythm Abdomen: soft; without distention; no specific tenderness to palpation;  without guarding or rebound tenderness; R inguinal hernia is present and is soft with no overlying skin changes; minimal TTP Back: without CVA tenderness; FROM at waist Extremities: without LE edema; symmetrical; without gross deformities Skin: warm and dry Neurologic: normal gait Psychological: alert and cooperative; normal mood and affect    No Known Allergies  Past Medical History:  Diagnosis Date   Cancer ([place])    prostate   GERD (gastroesophageal reflux disease)    Hypertension    Social History   Socioeconomic History   Marital status: Legally Separated    Spouse name: Not on file   Number of children: Not on file   Years of education: Not on file   Highest education level: Not on file  Occupational History    Not on file  Tobacco Use   Smoking status: Never Smoker   Smokeless tobacco: Never Used  Substance and Sexual Activity   Alcohol use: Not Currently    Comment:  3 40oz/week   Drug use: No   Sexual activity: Not on file  Other Topics Concern   Not on file  Social History Narrative   As of September, 2016 the patient works for a Set [employer]. 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 It is not particularly exertional work but does require some physical strength.   Patient has been separated from his wife since 31.   Social Determinants of Health   Financial Resource Strain:    Difficulty of Paying Living Expenses: Not on file  Food Insecurity:    Worried About Charity fundraiser in the Last Year: Not on file   [employer] of Food in the Last Year: Not on file  Transportation Needs:    Lack of Transportation (Medical): Not on file   Lack of Transportation (Non-Medical): Not on file  Physical Activity:    Days of Exercise per Week: Not on file   Minutes of Exercise per Session: Not on file  Stress:    Feeling of Stress : Not on file  Social Connections:    Frequency of Communication with Friends and Family: Not on file   Frequency of Social Gatherings with Friends and Family: Not on file   Attends Religious Services: Not on file   Active Member of Clubs or Organizations: Not on file   Attends Archivist Meetings: Not on file   Marital Status: Not on file  Intimate Partner Violence:    Fear of Current or Ex-Partner: Not on file   Emotionally Abused: Not on file   Physically Abused: Not on file   Sexually Abused: Not on file   Family History  Problem Relation Age of Onset   Ovarian cancer Mother    Cancer - Colon Father    Prostate cancer Father    Brain cancer Sister    Sickle cell anemia Brother      Vanessa Kick, MD 08/02/19 [phone]

## 2020-06-19 ENCOUNTER — Other Ambulatory Visit: Payer: Self-pay

## 2020-06-19 ENCOUNTER — Ambulatory Visit (HOSPITAL_COMMUNITY)
Admission: EM | Admit: 2020-06-19 | Discharge: 2020-06-19 | Disposition: A | Discharge status: Home / Self Care | Payer: Medicare (Managed Care) | Attending: Internal Medicine | Admitting: Internal Medicine

## 2020-06-19 ENCOUNTER — Encounter (HOSPITAL_COMMUNITY): Payer: Self-pay | Admitting: Emergency Medicine

## 2020-06-19 DIAGNOSIS — K648 Other hemorrhoids: Secondary | ICD-10-CM | POA: Diagnosis not present

## 2020-06-19 DIAGNOSIS — K5900 Constipation, unspecified: Secondary | ICD-10-CM

## 2020-06-19 MED ORDER — HYDROCORTISONE ACETATE 25 MG RE SUPP: 25.0000 mg | Freq: Two times a day (BID) | RECTAL | 0 refills | Status: DC | PRN

## 2020-06-19 MED ORDER — SENNOSIDES-DOCUSATE SODIUM 8.6-50 MG PO TABS: 1.0000 | ORAL_TABLET | Freq: Every day | ORAL | 0 refills | Status: DC

## 2020-06-19 NOTE — ED Triage Notes (Signed)
Pt c/o rectal bleeding and pain onset Thursday. Pt states that he noticed bleeding on wednesday night but it became heavier on Thursday. Pt believes he has a hemorrhoid. He states he has had constipation more lately.

## 2020-06-19 NOTE — Discharge Instructions (Signed)
Increase fruit and fibre intake Take stool softeners as needed to prevent constipation

## 2020-06-20 NOTE — ED Provider Notes (Signed)
[place]    CSN: [id number] Arrival date & time: 06/19/20  1322      History   Chief Complaint Chief Complaint  Patient presents with  . Rectal Pain  . Rectal Bleeding    HPI Jeremy Hayden is a 64 y.o. male comes to the urgent care with complaints of rectal pain and bright red bleeding which started about 4 days ago.  Patient noticed bright red blood on stool and also when he wipes.  He has been constipated over the past several days.  Bowel movements are painful.  Patient denies any sensation of rectal mass during or after having a bowel movement.  No abdominal pain or distention.  He has not tried any medications for constipation.  No vomiting, weight loss or nausea.  Patient is scheduled for colonoscopy at the end of the year.  Last colonoscopy was normal.   HPI  Past Medical History:  Diagnosis Date  . Cancer [facility])    prostate  . GERD (gastroesophageal reflux disease)   . Hypertension     Patient Active Problem List   Diagnosis Date Noted  . Hematemesis with nausea   . Dehydration 03/26/2015  . Chest pain 03/26/2015  . Gastroenteritis 03/26/2015  . Paroxysmal atrial fibrillation ([place]) 03/26/2015  . Blood in stool   . TINEA CAPITIS 07/01/2006  . HYPERLIPIDEMIA 07/01/2006  . Hypertension 07/01/2006  . GERD 07/01/2006  . [place] DISEASE, CERVICAL 07/01/2006    Past Surgical History:  Procedure Laterality Date  . ESOPHAGOGASTRODUODENOSCOPY N/A 03/27/2015   Procedure: ESOPHAGOGASTRODUODENOSCOPY (EGD);  Surgeon: Manus Gunning, MD;  Location: [place];  Service: Gastroenterology;  Laterality: N/A;  . NECK SURGERY  11/2001   multilevel c spine diskectomy, decompression, arthrodesis with bone graft and plate placed.  Dr Rennis Harding  . PROSTATE SURGERY    . ROTATOR CUFF REPAIR Right 2003   with partial acromiectomy/plasty       Home Medications    Prior to Admission medications   Medication Sig Start Date End Date Taking? Authorizing  Provider  Acetaminophen (STANBACK ASPIRIN FREE PO) Take by mouth.    [provider]  hydrocortisone (ANUSOL-HC) 25 MG suppository Place 1 suppository (25 mg total) rectally 2 (two) times daily as needed for hemorrhoids or anal itching. 06/19/20   Quaran Kedzierski, Myrene Galas, MD  senna-docusate (SENOKOT-S) 8.6-50 MG tablet Take 1 tablet by mouth daily. 06/19/20   Chase Picket, MD  omeprazole (PRILOSEC) 20 MG capsule Take 20 mg by mouth daily.  07/31/19  [provider]    Family History Family History  Problem Relation Age of Onset  . Ovarian cancer Mother   . Cancer - Colon Father   . Prostate cancer Father   . Brain cancer Sister   . Sickle cell anemia Brother     Social History Social History   Tobacco Use  . Smoking status: Never Smoker  . Smokeless tobacco: Never Used  Substance Use Topics  . Alcohol use: Not Currently    Comment:  3 40oz/week  . Drug use: No     Allergies   Patient has no known allergies.   Review of Systems Review of Systems  Constitutional: Negative.   Gastrointestinal: Positive for anal bleeding, blood in stool and rectal pain. Negative for abdominal pain, nausea and vomiting.  Genitourinary: Negative.   Musculoskeletal: Negative.   Neurological: Negative.  Negative for dizziness and light-headedness.     Physical Exam Triage Vital Signs ED Triage Vitals  Enc Vitals Group     BP 06/19/20 1458 (!) 139/91     Pulse Rate 06/19/20 1458 79     Resp --      Temp 06/19/20 1458 98.1 F (36.7 C)     Temp Source 06/19/20 1458 Oral     SpO2 06/19/20 1458 100 %     Weight --      Height --      Head Circumference --      Peak Flow --      Pain Score 06/19/20 1453 6     Pain Loc --      Pain Edu? --      Excl. in [place]? --    No data found.  Updated Vital Signs BP (!) 139/91 (BP Location: Right Arm)   Pulse 79   Temp 98.1 F (36.7 C) (Oral)   SpO2 100%   Visual Acuity Right Eye Distance:   Left Eye Distance:   Bilateral  Distance:    Right Eye Near:   Left Eye Near:    Bilateral Near:     Physical Exam Vitals and nursing note reviewed.  Constitutional:      General: He is not in acute distress.    Appearance: He is not ill-appearing.  Cardiovascular:     Rate and Rhythm: Normal rate and regular rhythm.     Pulses: Normal pulses.     Heart sounds: Normal heart sounds.  Pulmonary:     Effort: Pulmonary effort is normal.     Breath sounds: Normal breath sounds.  Abdominal:     General: Bowel sounds are normal. There is no distension.     Palpations: Abdomen is soft.     Tenderness: There is no abdominal tenderness. There is no rebound.  Musculoskeletal:        General: Normal range of motion.  Neurological:     Mental Status: He is alert.      UC Treatments / Results  Labs (all labs ordered are listed, but only abnormal results are displayed) Labs Reviewed - No data to display  EKG   Radiology No results found.  Procedures Procedures (including critical care time)  Medications Ordered in UC Medications - No data to display  Initial Impression / Assessment and Plan / UC Course  I have reviewed the triage vital signs and the nursing notes.  Pertinent labs & imaging results that were available during my care of the patient were reviewed by me and considered in my medical decision making (see chart for details).     1.  Bleeding, internal hemorrhoids: Anusol suppository Patient is advised to increase fiber and fluid intake to decrease constipation Patient has no signs of symptomatic anemia at this time Senokot has been prescribed for the patient to take.  Return if symptoms worsen Patient is encouraged to keep his appointment for the colonoscopy. Final Clinical Impressions(s) / UC Diagnoses   Final diagnoses:  Internal hemorrhoid, bleeding  Constipation, unspecified constipation type     Discharge Instructions     Increase fruit and fibre intake Take stool softeners as  needed to prevent constipation   ED Prescriptions    Medication Sig Dispense Auth. Provider   senna-docusate (SENOKOT-S) 8.6-50 MG tablet Take 1 tablet by mouth daily. 30 tablet Keanan Melander, Myrene Galas, MD   hydrocortisone (ANUSOL-HC) 25 MG suppository Place 1 suppository (25 mg total) rectally 2 (two) times daily as needed for hemorrhoids or anal itching. 12 suppository Jonia Oakey, Myrene Galas, MD  PDMP not reviewed this encounter.   Chase Picket, MD 06/20/20 1212

## 2020-07-28 ENCOUNTER — Ambulatory Visit (HOSPITAL_COMMUNITY)
Admission: EM | Admit: 2020-07-28 | Discharge: 2020-07-28 | Disposition: A | Discharge status: Home / Self Care | Payer: No Typology Code available for payment source | Attending: Student | Admitting: Student

## 2020-07-28 ENCOUNTER — Encounter (HOSPITAL_COMMUNITY): Payer: Self-pay | Admitting: *Deleted

## 2020-07-28 ENCOUNTER — Other Ambulatory Visit: Payer: Self-pay

## 2020-07-28 DIAGNOSIS — M5432 Sciatica, left side: Secondary | ICD-10-CM

## 2020-07-28 MED ORDER — NAPROXEN 500 MG PO TABS: 500.0000 mg | ORAL_TABLET | Freq: Two times a day (BID) | ORAL | 0 refills | Status: DC

## 2020-07-28 MED ORDER — PREDNISONE 20 MG PO TABS: 40.0000 mg | ORAL_TABLET | Freq: Every day | ORAL | 0 refills | Status: AC

## 2020-07-28 NOTE — ED Triage Notes (Signed)
Pt reports  Lt hip pain  That radiates to the front of Lt leg.  Pt reports he has taken Standback pain powders

## 2020-07-28 NOTE — Discharge Instructions (Signed)
-  For pain, naproxen twice daily for 7-10 days. Make sure to take this with food.  -Also take the steroid (prednisone) 40mg  (two pills) daily for 5 days.  -Continue to stretch as tolerated

## 2020-07-28 NOTE — ED Provider Notes (Signed)
[place]    CSN: [id number] Arrival date & time: 07/28/20  1320      History   Chief Complaint Chief Complaint  Patient presents with  . Hip Pain    HPI Jeremy Hayden is a 65 y.o. male presenting with left hip pain that radiates to the front of his left leg. History of prostate cancer, GERD, hypertension. States he's had left leg pain and weakness for 3 days now. Denies trauma, but states his job is active and involves lots of heavy lifting. States he's had some back issues since his prostate surgery 1 year ago. Denies denies numbness in arms/legs, denies weakness in arms/ right leg, denies saddle anesthesia, denies bowel/bladder incontinence.    HPI  Past Medical History:  Diagnosis Date  . Cancer [facility])    prostate  . GERD (gastroesophageal reflux disease)   . Hypertension     Patient Active Problem List   Diagnosis Date Noted  . Hematemesis with nausea   . Dehydration 03/26/2015  . Chest pain 03/26/2015  . Gastroenteritis 03/26/2015  . Paroxysmal atrial fibrillation ([place]) 03/26/2015  . Blood in stool   . TINEA CAPITIS 07/01/2006  . HYPERLIPIDEMIA 07/01/2006  . Hypertension 07/01/2006  . GERD 07/01/2006  . [place] DISEASE, CERVICAL 07/01/2006    Past Surgical History:  Procedure Laterality Date  . ESOPHAGOGASTRODUODENOSCOPY N/A 03/27/2015   Procedure: ESOPHAGOGASTRODUODENOSCOPY (EGD);  Surgeon: Manus Gunning, MD;  Location: [place];  Service: Gastroenterology;  Laterality: N/A;  . NECK SURGERY  11/2001   multilevel c spine diskectomy, decompression, arthrodesis with bone graft and plate placed.  Dr Rennis Harding  . PROSTATE SURGERY    . ROTATOR CUFF REPAIR Right 2003   with partial acromiectomy/plasty       Home Medications    Prior to Admission medications   Medication Sig Start Date End Date Taking? Authorizing Provider  naproxen (NAPROSYN) 500 MG tablet Take 1 tablet (500 mg total) by mouth 2 (two) times daily. 07/28/20  Yes  Hazel Sams, PA-C  predniSONE (DELTASONE) 20 MG tablet Take 2 tablets (40 mg total) by mouth daily for 5 days. 07/28/20 08/02/20 Yes Hazel Sams, PA-C  Acetaminophen (STANBACK ASPIRIN FREE PO) Take by mouth.    [provider]  hydrocortisone (ANUSOL-HC) 25 MG suppository Place 1 suppository (25 mg total) rectally 2 (two) times daily as needed for hemorrhoids or anal itching. 06/19/20   [place], Myrene Galas, MD  senna-docusate (SENOKOT-S) 8.6-50 MG tablet Take 1 tablet by mouth daily. 06/19/20   Chase Picket, MD  omeprazole (PRILOSEC) 20 MG capsule Take 20 mg by mouth daily.  07/31/19  [provider]    Family History Family History  Problem Relation Age of Onset  . Ovarian cancer Mother   . Cancer - Colon Father   . Prostate cancer Father   . Brain cancer Sister   . Sickle cell anemia Brother     Social History Social History   Tobacco Use  . Smoking status: Never Smoker  . Smokeless tobacco: Never Used  Substance Use Topics  . Alcohol use: Not Currently    Comment:  3 40oz/week  . Drug use: No     Allergies   Patient has no known allergies.   Review of Systems Review of Systems  Musculoskeletal:       Left leg pain   All other systems reviewed and are negative.    Physical Exam Triage Vital Signs ED Triage Vitals  Enc Vitals Group     BP 07/28/20 1511 114/71     Pulse Rate 07/28/20 1511 89     Resp 07/28/20 1511 18     Temp 07/28/20 1511 98.1 F (36.7 C)     Temp Source 07/28/20 1511 Oral     SpO2 07/28/20 1511 97 %     Weight --      Height --      Head Circumference --      Peak Flow --      Pain Score 07/28/20 1509 10     Pain Loc --      Pain Edu? --      Excl. in [place]? --    No data found.  Updated Vital Signs BP 114/71 (BP Location: Left Arm)   Pulse 89   Temp 98.1 F (36.7 C) (Oral)   Resp 18   SpO2 97%   Visual Acuity Right Eye Distance:   Left Eye Distance:   Bilateral Distance:    Right Eye Near:   Left  Eye Near:    Bilateral Near:     Physical Exam Vitals reviewed.  Constitutional:      General: He is not in acute distress.    Appearance: Normal appearance. He is not ill-appearing.  HENT:     Head: Normocephalic and atraumatic.  Cardiovascular:     Rate and Rhythm: Normal rate and regular rhythm.     Heart sounds: Normal heart sounds.  Pulmonary:     Effort: Pulmonary effort is normal.     Breath sounds: Normal breath sounds and air entry.  Abdominal:     Tenderness: There is no abdominal tenderness. There is no right CVA tenderness, left CVA tenderness, guarding or rebound.     Comments: No bowel or bladder incontinence.  Musculoskeletal:     Cervical back: Normal range of motion. No swelling, deformity, signs of trauma, rigidity, spasms, tenderness, bony tenderness or crepitus. No pain with movement.     Thoracic back: No swelling, deformity, signs of trauma, spasms, tenderness or bony tenderness. Normal range of motion. No scoliosis.     Lumbar back: No swelling, deformity, signs of trauma, spasms, tenderness or bony tenderness. Normal range of motion. Negative right straight leg raise test and negative left straight leg raise test. No scoliosis.     Comments: Strength 5/5 in UEs and R LE. L leg with no tenderness to palpation L hip, no spinous/paraspinous tenderness. Positive L straight leg raise. Strength 3/5. Walks with limp favoring L leg.   Neurological:     General: No focal deficit present.     Mental Status: He is alert.     Cranial Nerves: No cranial nerve deficit.  Psychiatric:        Mood and Affect: Mood normal.        Behavior: Behavior normal.        Thought Content: Thought content normal.        Judgment: Judgment normal.      UC Treatments / Results  Labs (all labs ordered are listed, but only abnormal results are displayed) Labs Reviewed - No data to display  EKG   Radiology No results found.  Procedures Procedures (including critical care  time)  Medications Ordered in UC Medications - No data to display  Initial Impression / Assessment and Plan / UC Course  I have reviewed the triage vital signs and the nursing notes.  Pertinent labs & imaging results that were available  during my care of the patient were reviewed by me and considered in my medical decision making (see chart for details).     For L sciatica, prednisone and naproxen as below. Pt is not diabetic.  ROM exercises, light duty at work, avoid heavy lifting.   Final Clinical Impressions(s) / UC Diagnoses   Final diagnoses:  Left sciatic nerve pain     Discharge Instructions     -For pain, naproxen twice daily for 7-10 days. Make sure to take this with food.  -Also take the steroid (prednisone) 40mg  (two pills) daily for 5 days.  -Continue to stretch as tolerated    ED Prescriptions    Medication Sig Dispense Auth. Provider   naproxen (NAPROSYN) 500 MG tablet Take 1 tablet (500 mg total) by mouth 2 (two) times daily. 30 tablet Hazel Sams, PA-C   predniSONE (DELTASONE) 20 MG tablet Take 2 tablets (40 mg total) by mouth daily for 5 days. 10 tablet Hazel Sams, PA-C     I have reviewed the PDMP during this encounter.   Hazel Sams, PA-C 07/28/20 1635

## 2021-02-26 ENCOUNTER — Ambulatory Visit (HOSPITAL_COMMUNITY)
Admission: EM | Admit: 2021-02-26 | Discharge: 2021-02-26 | Disposition: A | Discharge status: Home / Self Care | Payer: Medicare (Managed Care) | Attending: Internal Medicine | Admitting: Internal Medicine

## 2021-02-26 ENCOUNTER — Other Ambulatory Visit: Payer: Self-pay

## 2021-02-26 ENCOUNTER — Encounter (HOSPITAL_COMMUNITY): Payer: Self-pay | Admitting: *Deleted

## 2021-02-26 DIAGNOSIS — K2970 Gastritis, unspecified, without bleeding: Secondary | ICD-10-CM | POA: Diagnosis present

## 2021-02-26 DIAGNOSIS — Z20822 Contact with and (suspected) exposure to covid-19: Secondary | ICD-10-CM | POA: Insufficient documentation

## 2021-02-26 LAB — BASIC METABOLIC PANEL
Anion gap: 13 (ref 5–15)
BUN: 5 mg/dL — ABNORMAL LOW (ref 8–23)
CO2: 24 mmol/L (ref 22–32)
Calcium: 9.5 mg/dL (ref 8.9–10.3)
Chloride: 102 mmol/L (ref 98–111)
Creatinine, Ser: 1 mg/dL (ref 0.61–1.24)
GFR, Estimated: >60 mL/min (ref >60)
Glucose, Bld: 97 mg/dL (ref 70–99)
Potassium: 3.8 mmol/L (ref 3.5–5.1)
Sodium: 139 mmol/L (ref 135–145)

## 2021-02-26 MED ORDER — ONDANSETRON 4 MG PO TBDP: 4.0000 mg | ORAL_TABLET | Freq: Three times a day (TID) | ORAL | 0 refills | Status: DC | PRN

## 2021-02-26 NOTE — ED Triage Notes (Signed)
Pt reports vomiting and feeling sick.

## 2021-02-26 NOTE — Discharge Instructions (Addendum)
Increase oral fluid intake Take medications as prescribed We will call you with recommendations if labs are abnormal Return to urgent care if symptoms persist.

## 2021-02-26 NOTE — ED Provider Notes (Signed)
[facility]    CSN: [id number] Arrival date & time: 02/26/21  1756      History   Chief Complaint Chief Complaint  Patient presents with   Emesis    HPI Jeremy Hayden is a 65 y.o. male comes to the urgent care with 2-day history of nausea and nonbloody nonbilious vomiting.  Patient says symptoms started yesterday and has been persistent.  He denies any fever or chills.  No abdominal distention.  He has some abdominal discomfort.  No generalized body aches.  No cough or sputum production.  Patient denies any changes in his diet.  No diarrhea.  No sick contacts.  Patient is fully vaccinated against COVID-19 virus.  HPI  Past Medical History:  Diagnosis Date   Cancer [facility])    prostate   GERD (gastroesophageal reflux disease)    Hypertension     Patient Active Problem List   Diagnosis Date Noted   Hematemesis with nausea    Dehydration 03/26/2015   Chest pain 03/26/2015   Gastroenteritis 03/26/2015   Paroxysmal atrial fibrillation ([place]) 03/26/2015   Blood in stool    TINEA CAPITIS 07/01/2006   HYPERLIPIDEMIA 07/01/2006   Hypertension 07/01/2006   GERD 07/01/2006   [place] DISEASE, CERVICAL 07/01/2006    Past Surgical History:  Procedure Laterality Date   ESOPHAGOGASTRODUODENOSCOPY N/A 03/27/2015   Procedure: ESOPHAGOGASTRODUODENOSCOPY (EGD);  Surgeon: Manus Gunning, MD;  Location: [facility];  Service: Gastroenterology;  Laterality: N/A;   NECK SURGERY  11/2001   multilevel c spine diskectomy, decompression, arthrodesis with bone graft and plate placed.  Dr Rennis Harding   PROSTATE SURGERY     ROTATOR CUFF REPAIR Right 2003   with partial acromiectomy/plasty       Home Medications    Prior to Admission medications   Medication Sig Start Date End Date Taking? Authorizing Provider  amLODipine (NORVASC) 5 MG tablet Take 5 mg by mouth daily.   Yes [provider]  ondansetron (ZOFRAN ODT) 4 MG disintegrating tablet Take 1 tablet (4 mg  total) by mouth every 8 (eight) hours as needed for nausea or vomiting. 02/26/21  Yes Marylin Lathon, Myrene Galas, MD  spironolactone (ALDACTONE) 25 MG tablet Take 12.5 mg by mouth daily.   Yes [provider]  Acetaminophen (STANBACK ASPIRIN FREE PO) Take by mouth.    [provider]  hydrocortisone (ANUSOL-HC) 25 MG suppository Place 1 suppository (25 mg total) rectally 2 (two) times daily as needed for hemorrhoids or anal itching. 06/19/20   Sabrina Keough, Myrene Galas, MD  naproxen (NAPROSYN) 500 MG tablet Take 1 tablet (500 mg total) by mouth 2 (two) times daily. 07/28/20   Hazel Sams, PA-C  senna-docusate (SENOKOT-S) 8.6-50 MG tablet Take 1 tablet by mouth daily. 06/19/20   Chase Picket, MD  omeprazole (PRILOSEC) 20 MG capsule Take 20 mg by mouth daily.  07/31/19  [provider]    Family History Family History  Problem Relation Age of Onset   Ovarian cancer Mother    Cancer - Colon Father    Prostate cancer Father    Brain cancer Sister    Sickle cell anemia Brother     Social History Social History   Tobacco Use   Smoking status: Never   Smokeless tobacco: Never  Substance Use Topics   Alcohol use: Not Currently    Comment:  3 40oz/week   Drug use: No     Allergies   Patient has no known allergies.  Review of Systems Review of Systems  Constitutional: Negative.   HENT:  Positive for sore throat.   Respiratory: Negative.    Gastrointestinal:  Positive for abdominal pain and vomiting. Negative for diarrhea and nausea.  Genitourinary: Negative.   Musculoskeletal: Negative.   Neurological:  Positive for headaches.    Physical Exam Triage Vital Signs ED Triage Vitals [02/26/21 1859]  Enc Vitals Group     BP (!) 148/100     Pulse Rate 82     Resp 20     Temp 98.2 F (36.8 C)     Temp src      SpO2 100 %     Weight      Height      Head Circumference      Peak Flow      Pain Score 2     Pain Loc      Pain Edu?      Excl. in [place]?    No  data found.  Updated Vital Signs BP (!) 148/100   Pulse 82   Temp 98.2 F (36.8 C)   Resp 20   SpO2 100%   Visual Acuity Right Eye Distance:   Left Eye Distance:   Bilateral Distance:    Right Eye Near:   Left Eye Near:    Bilateral Near:     Physical Exam Vitals and nursing note reviewed.  Constitutional:      General: He is not in acute distress.    Appearance: He is not ill-appearing.  HENT:     Right Ear: Tympanic membrane normal.     Left Ear: Tympanic membrane normal.     Mouth/Throat:     Mouth: Mucous membranes are moist.     Pharynx: No posterior oropharyngeal erythema.  Cardiovascular:     Rate and Rhythm: Normal rate and regular rhythm.  Pulmonary:     Effort: Pulmonary effort is normal.     Breath sounds: Normal breath sounds.  Abdominal:     General: Bowel sounds are normal.     Palpations: Abdomen is soft.  Neurological:     Mental Status: He is alert.     UC Treatments / Results  Labs (all labs ordered are listed, but only abnormal results are displayed) Labs Reviewed  SARS CORONAVIRUS 2 (TAT 6-24 HRS)  BASIC METABOLIC PANEL    EKG   Radiology No results found.  Procedures Procedures (including critical care time)  Medications Ordered in UC Medications - No data to display  Initial Impression / Assessment and Plan / UC Course  I have reviewed the triage vital signs and the nursing notes.  Pertinent labs & imaging results that were available during my care of the patient were reviewed by me and considered in my medical decision making (see chart for details).     1.  Viral gastritis: Increase oral fluid intake Zofran as needed for nausea/vomiting COVID-19 PCR test We will call you with recommendations if labs are abnormal. Final Clinical Impressions(s) / UC Diagnoses   Final diagnoses:  Viral gastritis     Discharge Instructions      Increase oral fluid intake Take medications as prescribed We will call you with  recommendations if labs are abnormal Return to urgent care if symptoms persist.   ED Prescriptions     Medication Sig Dispense Auth. Provider   ondansetron (ZOFRAN ODT) 4 MG disintegrating tablet Take 1 tablet (4 mg total) by mouth every 8 (eight) hours as needed  for nausea or vomiting. 20 tablet [place] Rommel, Myrene Galas, MD      PDMP not reviewed this encounter.   Chase Picket, MD 02/26/21 2003

## 2021-02-27 LAB — SARS CORONAVIRUS 2 (TAT 6-24 HRS): SARS Coronavirus 2: NEGATIVE

## 2021-04-15 ENCOUNTER — Other Ambulatory Visit: Payer: Self-pay

## 2021-04-15 ENCOUNTER — Encounter (HOSPITAL_COMMUNITY): Payer: Self-pay | Admitting: Emergency Medicine

## 2021-04-15 ENCOUNTER — Ambulatory Visit (HOSPITAL_COMMUNITY)
Admission: EM | Admit: 2021-04-15 | Discharge: 2021-04-15 | Disposition: A | Discharge status: Home / Self Care | Payer: Medicare (Managed Care) | Attending: Physician Assistant | Admitting: Physician Assistant

## 2021-04-15 DIAGNOSIS — S161XXA Strain of muscle, fascia and tendon at neck level, initial encounter: Secondary | ICD-10-CM | POA: Diagnosis not present

## 2021-04-15 MED ORDER — CYCLOBENZAPRINE HCL 10 MG PO TABS: 10.0000 mg | ORAL_TABLET | Freq: Two times a day (BID) | ORAL | 0 refills | Status: DC | PRN

## 2021-04-15 MED ORDER — PREDNISONE 20 MG PO TABS: 40.0000 mg | ORAL_TABLET | Freq: Every day | ORAL | 0 refills | Status: AC
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
## 2021-04-15 NOTE — Discharge Instructions (Addendum)
Use muscle relaxer sparingly-- may cause drowsiness. Follow up if no gradual improvement of discomfort or sooner with any further concerns.
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
## 2021-04-28 ENCOUNTER — Encounter (HOSPITAL_COMMUNITY): Payer: Self-pay | Admitting: Emergency Medicine

## 2021-04-28 ENCOUNTER — Ambulatory Visit (HOSPITAL_COMMUNITY)
Admission: EM | Admit: 2021-04-28 | Discharge: 2021-04-28 | Disposition: A | Discharge status: Home / Self Care | Payer: No Typology Code available for payment source | Attending: Internal Medicine | Admitting: Internal Medicine

## 2021-04-28 ENCOUNTER — Other Ambulatory Visit: Payer: Self-pay

## 2021-04-28 DIAGNOSIS — Z20822 Contact with and (suspected) exposure to covid-19: Secondary | ICD-10-CM | POA: Insufficient documentation

## 2021-04-28 DIAGNOSIS — B9789 Other viral agents as the cause of diseases classified elsewhere: Secondary | ICD-10-CM | POA: Diagnosis not present

## 2021-04-28 DIAGNOSIS — J029 Acute pharyngitis, unspecified: Secondary | ICD-10-CM | POA: Diagnosis not present

## 2021-04-28 DIAGNOSIS — J028 Acute pharyngitis due to other specified organisms: Secondary | ICD-10-CM | POA: Insufficient documentation

## 2021-04-28 NOTE — Discharge Instructions (Addendum)
Warm salt water gargle Chloraseptic throat spray Increase oral fluid intake We will call you with recommendations if labs are abnormal

## 2021-04-28 NOTE — ED Provider Notes (Signed)
[facility]    CSN: [id number] Arrival date & time: 04/28/21  1228      History   Chief Complaint Chief Complaint  Patient presents with   Sore Throat    HPI Jeremy Hayden is a 65 y.o. male comes to urgent care with 2-day history of sore throat.  Symptoms started a couple of days ago and has been persistent.  It is associated with pain on swallowing.  No nausea, vomiting or diarrhea.  No sick contacts.  No rash no ear pain and no joint aches.   HPI  Past Medical History:  Diagnosis Date   Cancer [facility])    prostate   GERD (gastroesophageal reflux disease)    Hypertension     Patient Active Problem List   Diagnosis Date Noted   Hematemesis with nausea    Dehydration 03/26/2015   Chest pain 03/26/2015   Gastroenteritis 03/26/2015   Paroxysmal atrial fibrillation ([place]) 03/26/2015   Blood in stool    TINEA CAPITIS 07/01/2006   HYPERLIPIDEMIA 07/01/2006   Hypertension 07/01/2006   GERD 07/01/2006   [place] DISEASE, CERVICAL 07/01/2006    Past Surgical History:  Procedure Laterality Date   ESOPHAGOGASTRODUODENOSCOPY N/A 03/27/2015   Procedure: ESOPHAGOGASTRODUODENOSCOPY (EGD);  Surgeon: Manus Gunning, MD;  Location: [facility];  Service: Gastroenterology;  Laterality: N/A;   NECK SURGERY  11/2001   multilevel c spine diskectomy, decompression, arthrodesis with bone graft and plate placed.  Dr Rennis Harding   PROSTATE SURGERY     ROTATOR CUFF REPAIR Right 2003   with partial acromiectomy/plasty       Home Medications    Prior to Admission medications   Medication Sig Start Date End Date Taking? Authorizing Provider  Acetaminophen (STANBACK ASPIRIN FREE PO) Take by mouth.    [provider]  amLODipine (NORVASC) 5 MG tablet Take 5 mg by mouth daily.    [provider]  cyclobenzaprine (FLEXERIL) 10 MG tablet Take 1 tablet (10 mg total) by mouth 2 (two) times daily as needed for muscle spasms. 04/15/21   Francene Finders, PA-C   hydrocortisone (ANUSOL-HC) 25 MG suppository Place 1 suppository (25 mg total) rectally 2 (two) times daily as needed for hemorrhoids or anal itching. 06/19/20   Rhaelyn Giron, Myrene Galas, MD  naproxen (NAPROSYN) 500 MG tablet Take 1 tablet (500 mg total) by mouth 2 (two) times daily. 07/28/20   Hazel Sams, PA-C  ondansetron (ZOFRAN ODT) 4 MG disintegrating tablet Take 1 tablet (4 mg total) by mouth every 8 (eight) hours as needed for nausea or vomiting. 02/26/21   Holli Rengel, Myrene Galas, MD  senna-docusate (SENOKOT-S) 8.6-50 MG tablet Take 1 tablet by mouth daily. 06/19/20   Chase Picket, MD  spironolactone (ALDACTONE) 25 MG tablet Take 12.5 mg by mouth daily.    [provider]  omeprazole (PRILOSEC) 20 MG capsule Take 20 mg by mouth daily.  07/31/19  [provider]    Family History Family History  Problem Relation Age of Onset   Ovarian cancer Mother    Cancer - Colon Father    Prostate cancer Father    Brain cancer Sister    Sickle cell anemia Brother     Social History Social History   Tobacco Use   Smoking status: Never   Smokeless tobacco: Never  Substance Use Topics   Alcohol use: Not Currently    Comment:  3 40oz/week   Drug use: No     Allergies  Patient has no known allergies.   Review of Systems Review of Systems  HENT:  Positive for sore throat. Negative for congestion and voice change.   Respiratory: Negative.  Negative for shortness of breath and wheezing.   Cardiovascular:  Negative for chest pain.  Gastrointestinal: Negative.   Genitourinary: Negative.     Physical Exam Triage Vital Signs ED Triage Vitals  Enc Vitals Group     BP 04/28/21 1352 126/89     Pulse Rate 04/28/21 1352 98     Resp 04/28/21 1352 16     Temp 04/28/21 1352 98 F (36.7 C)     Temp Source 04/28/21 1352 Oral     SpO2 04/28/21 1352 98 %     Weight --      Height --      Head Circumference --      Peak Flow --      Pain Score 04/28/21 1351 9     Pain Loc --       Pain Edu? --      Excl. in [place]? --    No data found.  Updated Vital Signs BP 126/89 (BP Location: Left Arm)   Pulse 98   Temp 98 F (36.7 C) (Oral)   Resp 16   SpO2 98%   Visual Acuity Right Eye Distance:   Left Eye Distance:   Bilateral Distance:    Right Eye Near:   Left Eye Near:    Bilateral Near:     Physical Exam   UC Treatments / Results  Labs (all labs ordered are listed, but only abnormal results are displayed) Labs Reviewed  SARS CORONAVIRUS 2 (TAT 6-24 HRS)  RESPIRATORY PANEL BY PCR    EKG   Radiology No results found.  Procedures Procedures (including critical care time)  Medications Ordered in UC Medications - No data to display  Initial Impression / Assessment and Plan / UC Course  I have reviewed the triage vital signs and the nursing notes.  Pertinent labs & imaging results that were available during my care of the patient were reviewed by me and considered in my medical decision making (see chart for details).     1.  Acute viral pharyngitis: Respiratory PCR, COVID-19 PCR test Increase oral fluid intake Tylenol/Motrin as needed for pain Warm salt water gargle Chloraseptic throat spray We will call you with recommendations if labs are abnormal Return to urgent care if symptoms worsen. Final Clinical Impressions(s) / UC Diagnoses   Final diagnoses:  Viral pharyngitis     Discharge Instructions      Warm salt water gargle Chloraseptic throat spray Increase oral fluid intake We will call you with recommendations if labs are abnormal   ED Prescriptions   None    PDMP not reviewed this encounter.   Chase Picket, MD 04/28/21 [phone]

## 2021-04-28 NOTE — ED Triage Notes (Signed)
Pt c/o sore throat since Thursday.

## 2021-04-29 LAB — SARS CORONAVIRUS 2 (TAT 6-24 HRS): SARS Coronavirus 2: NEGATIVE

## 2021-05-01 ENCOUNTER — Telehealth (HOSPITAL_COMMUNITY): Payer: Self-pay | Admitting: *Deleted

## 2021-05-02 LAB — RESPIRATORY PANEL BY PCR

## 2021-05-10 ENCOUNTER — Ambulatory Visit (INDEPENDENT_AMBULATORY_CARE_PROVIDER_SITE_OTHER): Payer: No Typology Code available for payment source

## 2021-05-10 ENCOUNTER — Other Ambulatory Visit: Payer: Self-pay

## 2021-05-10 ENCOUNTER — Ambulatory Visit (HOSPITAL_COMMUNITY)
Admission: EM | Admit: 2021-05-10 | Discharge: 2021-05-10 | Disposition: A | Discharge status: Home / Self Care | Payer: No Typology Code available for payment source | Attending: Family Medicine | Admitting: Family Medicine

## 2021-05-10 DIAGNOSIS — M542 Cervicalgia: Secondary | ICD-10-CM | POA: Diagnosis not present

## 2021-05-10 MED ORDER — PREDNISONE 20 MG PO TABS: 40.0000 mg | ORAL_TABLET | Freq: Every day | ORAL | 0 refills | Status: DC
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
## 2021-07-11 ENCOUNTER — Encounter (HOSPITAL_COMMUNITY): Payer: Self-pay | Admitting: *Deleted

## 2021-07-11 ENCOUNTER — Other Ambulatory Visit: Payer: Self-pay

## 2021-07-11 ENCOUNTER — Ambulatory Visit (HOSPITAL_COMMUNITY)
Admission: EM | Admit: 2021-07-11 | Discharge: 2021-07-11 | Disposition: A | Discharge status: Home / Self Care | Payer: No Typology Code available for payment source | Attending: Internal Medicine | Admitting: Internal Medicine

## 2021-07-11 DIAGNOSIS — B349 Viral infection, unspecified: Secondary | ICD-10-CM

## 2021-07-11 LAB — POC INFLUENZA A AND B ANTIGEN (URGENT CARE ONLY)
INFLUENZA A ANTIGEN, POC: NEGATIVE
INFLUENZA B ANTIGEN, POC: NEGATIVE

## 2021-07-11 NOTE — ED Notes (Signed)
Labeled flu swab in lab

## 2021-07-11 NOTE — Discharge Instructions (Signed)
Increase oral fluid intake Tylenol as needed for fever/body aches Return to urgent care if you have any worsening symptoms.

## 2021-07-11 NOTE — ED Triage Notes (Signed)
Pt reports he ate some bad food and has diarrhea . Pt also reports sore throat and cough.

## 2021-07-11 NOTE — ED Triage Notes (Signed)
PT needs a work note

## 2021-07-12 NOTE — ED Provider Notes (Signed)
[facility]    CSN: [id number] Arrival date & time: 07/11/21  1155      History   Chief Complaint Chief Complaint  Patient presents with   Cough   Diarrhea   Sore Throat    HPI Jeremy Hayden is a 65 y.o. male comes to the urgent care with history of diarrhea.  Diarrheal episode started on Monday and resolved by Tuesday.  Patient ate [employer] and started experiencing some abdominal crampy pain and diarrhea about an hour after eating [employer] sandwich.  He denied any vomiting.  Diarrhea was nonbloody nonmucoid.  Subsequently patient developed a sore throat and a cough.  No shortness of breath or wheezing.  No sick contacts.  No fever or chills.  Patient feels better now.  Diarrhea has subsided and abdominal pain is subsided as well.  He is requesting a work note to return to work.  HPI  Past Medical History:  Diagnosis Date   Cancer [facility])    prostate   GERD (gastroesophageal reflux disease)    Hypertension     Patient Active Problem List   Diagnosis Date Noted   Hematemesis with nausea    Dehydration 03/26/2015   Chest pain 03/26/2015   Gastroenteritis 03/26/2015   Paroxysmal atrial fibrillation ([place]) 03/26/2015   Blood in stool    TINEA CAPITIS 07/01/2006   HYPERLIPIDEMIA 07/01/2006   Hypertension 07/01/2006   GERD 07/01/2006   [place] DISEASE, CERVICAL 07/01/2006    Past Surgical History:  Procedure Laterality Date   ESOPHAGOGASTRODUODENOSCOPY N/A 03/27/2015   Procedure: ESOPHAGOGASTRODUODENOSCOPY (EGD);  Surgeon: Manus Gunning, MD;  Location: [place];  Service: Gastroenterology;  Laterality: N/A;   NECK SURGERY  11/2001   multilevel c spine diskectomy, decompression, arthrodesis with bone graft and plate placed.  Dr Rennis Harding   PROSTATE SURGERY     ROTATOR CUFF REPAIR Right 2003   with partial acromiectomy/plasty       Home Medications    Prior to Admission medications   Medication Sig Start Date End Date Taking? Authorizing  Provider  Acetaminophen (STANBACK ASPIRIN FREE PO) Take by mouth.    [provider]  amLODipine (NORVASC) 5 MG tablet Take 5 mg by mouth daily.    [provider]  hydrocortisone (ANUSOL-HC) 25 MG suppository Place 1 suppository (25 mg total) rectally 2 (two) times daily as needed for hemorrhoids or anal itching. 06/19/20   Chase Picket, MD  ondansetron (ZOFRAN ODT) 4 MG disintegrating tablet Take 1 tablet (4 mg total) by mouth every 8 (eight) hours as needed for nausea or vomiting. 02/26/21   Lois Slagel, Myrene Galas, MD  senna-docusate (SENOKOT-S) 8.6-50 MG tablet Take 1 tablet by mouth daily. 06/19/20   Chase Picket, MD  omeprazole (PRILOSEC) 20 MG capsule Take 20 mg by mouth daily.  07/31/19  [provider]    Family History Family History  Problem Relation Age of Onset   Ovarian cancer Mother    Cancer - Colon Father    Prostate cancer Father    Brain cancer Sister    Sickle cell anemia Brother     Social History Social History   Tobacco Use   Smoking status: Never   Smokeless tobacco: Never  Substance Use Topics   Alcohol use: Not Currently    Comment:  3 40oz/week   Drug use: No     Allergies   Patient has no known allergies.   Review of Systems Review of Systems As per HPI  Physical Exam Triage Vital Signs ED Triage Vitals  Enc Vitals Group     BP 07/11/21 1449 (!) 163/96     Pulse Rate 07/11/21 1449 (!) 104     Resp 07/11/21 1449 20     Temp 07/11/21 1449 100 F (37.8 C)     Temp src --      SpO2 07/11/21 1449 95 %     Weight --      Height --      Head Circumference --      Peak Flow --      Pain Score 07/11/21 1447 0     Pain Loc --      Pain Edu? --      Excl. in [place]? --    No data found.  Updated Vital Signs BP (!) 163/96    Pulse (!) 104    Temp 100 F (37.8 C)    Resp 20    SpO2 95%   Visual Acuity Right Eye Distance:   Left Eye Distance:   Bilateral Distance:    Right Eye Near:   Left Eye Near:     Bilateral Near:     Physical Exam Vitals and nursing note reviewed.  Constitutional:      General: He is not in acute distress.    Appearance: He is well-developed. He is not ill-appearing.  HENT:     Mouth/Throat:     Mouth: Mucous membranes are moist.     Pharynx: No pharyngeal swelling or posterior oropharyngeal erythema.     Tonsils: No tonsillar exudate or tonsillar abscesses.  Cardiovascular:     Rate and Rhythm: Normal rate and regular rhythm.  Pulmonary:     Effort: Pulmonary effort is normal. No respiratory distress.     Breath sounds: No wheezing or rhonchi.  Abdominal:     General: Bowel sounds are normal.     Palpations: Abdomen is soft.  Neurological:     Mental Status: He is alert.     UC Treatments / Results  Labs (all labs ordered are listed, but only abnormal results are displayed) Labs Reviewed  POC INFLUENZA A AND B ANTIGEN (URGENT CARE ONLY)    EKG   Radiology No results found.  Procedures Procedures (including critical care time)  Medications Ordered in UC Medications - No data to display  Initial Impression / Assessment and Plan / UC Course  I have reviewed the triage vital signs and the nursing notes.  Pertinent labs & imaging results that were available during my care of the patient were reviewed by me and considered in my medical decision making (see chart for details).     1.  Acute viral illness, improved: Increase oral fluid intake Tylenol as needed for pain and/or fever Return to urgent care if symptoms worsen Point-of-care flu is negative.  Final Clinical Impressions(s) / UC Diagnoses   Final diagnoses:  Viral illness     Discharge Instructions      Increase oral fluid intake Tylenol as needed for fever/body aches Return to urgent care if you have any worsening symptoms.   ED Prescriptions   None    PDMP not reviewed this encounter.   Chase Picket, MD 07/12/21 [phone]

## 2021-08-05 ENCOUNTER — Other Ambulatory Visit: Payer: Self-pay

## 2021-08-05 ENCOUNTER — Ambulatory Visit (HOSPITAL_COMMUNITY)
Admission: EM | Admit: 2021-08-05 | Discharge: 2021-08-05 | Disposition: A | Discharge status: Home / Self Care | Payer: No Typology Code available for payment source | Attending: Nurse Practitioner | Admitting: Nurse Practitioner

## 2021-08-05 ENCOUNTER — Encounter (HOSPITAL_COMMUNITY): Payer: Self-pay | Admitting: *Deleted

## 2021-08-05 DIAGNOSIS — Z1152 Encounter for screening for COVID-19: Secondary | ICD-10-CM | POA: Diagnosis present

## 2021-08-05 DIAGNOSIS — A0811 Acute gastroenteropathy due to Norwalk agent: Secondary | ICD-10-CM | POA: Diagnosis present

## 2021-08-05 DIAGNOSIS — U071 COVID-19: Secondary | ICD-10-CM | POA: Diagnosis not present

## 2021-08-05 LAB — COMPREHENSIVE METABOLIC PANEL
ALT: 37 U/L (ref 0–44)
AST: 114 U/L — ABNORMAL HIGH (ref 15–41)
Albumin: 3.2 g/dL — ABNORMAL LOW (ref 3.5–5.0)
Alkaline Phosphatase: 90 U/L (ref 38–126)
Anion gap: 11 (ref 5–15)
BUN: 8 mg/dL (ref 8–23)
CO2: 27 mmol/L (ref 22–32)
Calcium: 9.9 mg/dL (ref 8.9–10.3)
Chloride: 100 mmol/L (ref 98–111)
Creatinine, Ser: 0.76 mg/dL (ref 0.61–1.24)
GFR, Estimated: >60 mL/min (ref >60)
Glucose, Bld: 94 mg/dL (ref 70–99)
Potassium: 3.4 mmol/L — ABNORMAL LOW (ref 3.5–5.1)
Sodium: 138 mmol/L (ref 135–145)
Total Bilirubin: 0.7 mg/dL (ref 0.3–1.2)
Total Protein: 8.2 g/dL — ABNORMAL HIGH (ref 6.5–8.1)

## 2021-08-05 LAB — POCT URINALYSIS DIPSTICK, ED / UC
Glucose, UA: NEGATIVE mg/dL
Hgb urine dipstick: NEGATIVE
Leukocytes,Ua: NEGATIVE
Nitrite: NEGATIVE
Protein, ur: 30 mg/dL — AB
Specific Gravity, Urine: 1.025 (ref 1.005–1.030)
Urobilinogen, UA: 4 mg/dL — ABNORMAL HIGH (ref 0.0–1.0)
pH: 6 (ref 5.0–8.0)

## 2021-08-05 LAB — CBC WITH DIFFERENTIAL/PLATELET
Abs Immature Granulocytes: 0.01 10*3/uL (ref 0.00–0.07)
Basophils Absolute: 0 10*3/uL (ref 0.0–0.1)
Basophils Relative: 0 %
Eosinophils Absolute: 0 10*3/uL (ref 0.0–0.5)
Eosinophils Relative: 1 %
HCT: 40.5 % (ref 39.0–52.0)
Hemoglobin: 14.4 g/dL (ref 13.0–17.0)
Immature Granulocytes: 0 %
Lymphocytes Relative: 22 %
Lymphs Abs: 0.8 10*3/uL (ref 0.7–4.0)
MCH: 35.6 pg — ABNORMAL HIGH (ref 26.0–34.0)
MCHC: 35.6 g/dL (ref 30.0–36.0)
MCV: 100 fL (ref 80.0–100.0)
Monocytes Absolute: 0.3 10*3/uL (ref 0.1–1.0)
Monocytes Relative: 9 %
Neutro Abs: 2.5 10*3/uL (ref 1.7–7.7)
Neutrophils Relative %: 68 %
Platelets: 360 10*3/uL (ref 150–400)
RBC: 4.05 MIL/uL — ABNORMAL LOW (ref 4.22–5.81)
RDW: 10.6 % — ABNORMAL LOW (ref 11.5–15.5)
WBC: 3.7 10*3/uL — ABNORMAL LOW (ref 4.0–10.5)
nRBC: 0 % (ref 0.0–0.2)

## 2021-08-05 LAB — LIPASE, BLOOD: Lipase: 54 U/L — ABNORMAL HIGH (ref 11–51)

## 2021-08-05 LAB — POC INFLUENZA A AND B ANTIGEN (URGENT CARE ONLY)
INFLUENZA A ANTIGEN, POC: NEGATIVE
INFLUENZA B ANTIGEN, POC: NEGATIVE

## 2021-08-05 NOTE — ED Provider Notes (Signed)
[facility]    CSN: [id number] Arrival date & time: 08/05/21  1001      History   Chief Complaint Chief Complaint  Patient presents with   Emesis   Chills    HPI Jeremy Hayden is a 66 y.o. male.   Subjective:   REINHART SAULTERS is a 66 y.o. male with a history of hypertension and GERD who presents for evaluation of nausea, vomiting, chills, sweats, body aches, abdominal cramping, weakness and fatigue. Symptoms have been present for 4 days and overall has been  slowing improving. He has tried to alleviate the symptoms with rest, alka seltzer, tylenol and Theraflu with moderate relief. The abdominal cramping has completely resolved. Patient reports that he was sent home from work because of the symptoms but was able to return to work the following day. Patient doesn't have a sore throat but reports a glass-like sensation when he drinks water. He does not have this sensation when drinking any other liquids. The vomiting is intermittent and the last episode was this morning. He denies any fevers, hematemesis, melena, rectal bleeding, headache, dizziness, hematuria, urinary symptoms, back pain or flank pain. He reports drinking 2-ounces of liquor daily before bed. He also drinks a couple of 12-ounces beers as well. He denies any tobacco abuse, illicit drug use or history of liver disease. He reports compliance with prescribed medication regimen and avoids foods that aggravates his reflux symptoms. He denies any known sick contacts. He has no history of COVID. He has been vaccinated against COVID but has not had any boosters. Patient has appointment with [facility] on Tuesday but felt as if he couldn't wait to be seen   The following portions of the patient's history were reviewed and updated as appropriate: allergies, current medications, past family history, past medical history, past social history, past surgical history, and problem list.    Past Medical History:  Diagnosis Date    Cancer [facility])    prostate   GERD (gastroesophageal reflux disease)    Hypertension     Patient Active Problem List   Diagnosis Date Noted   Hematemesis with nausea    Dehydration 03/26/2015   Chest pain 03/26/2015   Gastroenteritis 03/26/2015   Paroxysmal atrial fibrillation ([place]) 03/26/2015   Blood in stool    TINEA CAPITIS 07/01/2006   HYPERLIPIDEMIA 07/01/2006   Hypertension 07/01/2006   GERD 07/01/2006   [place] DISEASE, CERVICAL 07/01/2006    Past Surgical History:  Procedure Laterality Date   ESOPHAGOGASTRODUODENOSCOPY N/A 03/27/2015   Procedure: ESOPHAGOGASTRODUODENOSCOPY (EGD);  Surgeon: Manus Gunning, MD;  Location: [place];  Service: Gastroenterology;  Laterality: N/A;   NECK SURGERY  11/2001   multilevel c spine diskectomy, decompression, arthrodesis with bone graft and plate placed.  Dr Rennis Harding   PROSTATE SURGERY     ROTATOR CUFF REPAIR Right 2003   with partial acromiectomy/plasty       Home Medications    Prior to Admission medications   Medication Sig Start Date End Date Taking? Authorizing Provider  Acetaminophen (STANBACK ASPIRIN FREE PO) Take by mouth.   Yes [provider]  amLODipine (NORVASC) 5 MG tablet Take 5 mg by mouth daily.   Yes [provider]  OMEPRAZOLE PO Take by mouth.   Yes [provider]  hydrocortisone (ANUSOL-HC) 25 MG suppository Place 1 suppository (25 mg total) rectally 2 (two) times daily as needed for hemorrhoids or anal itching. 06/19/20   Chase Picket, MD  ondansetron [facility]  ODT) 4 MG disintegrating tablet Take 1 tablet (4 mg total) by mouth every 8 (eight) hours as needed for nausea or vomiting. 02/26/21   Lamptey, Myrene Galas, MD  senna-docusate (SENOKOT-S) 8.6-50 MG tablet Take 1 tablet by mouth daily. 06/19/20   Lamptey, Myrene Galas, MD    Family History Family History  Problem Relation Age of Onset   Ovarian cancer Mother    Cancer - Colon Father    Prostate cancer Father    Brain  cancer Sister    Sickle cell anemia Brother     Social History Social History   Tobacco Use   Smoking status: Never   Smokeless tobacco: Never  Vaping Use   Vaping Use: Never used  Substance Use Topics   Alcohol use: Not Currently    Alcohol/week: 24.0 standard drinks    Types: 24 Cans of beer per week   Drug use: No     Allergies   Patient has no known allergies.   Review of Systems Review of Systems  Constitutional:  Positive for chills, diaphoresis and fatigue. Negative for fever.  HENT:  Negative for sore throat and trouble swallowing.   Respiratory:  Negative for cough and shortness of breath.   Gastrointestinal:  Positive for abdominal pain, nausea and vomiting. Negative for anal bleeding, blood in stool, constipation, diarrhea and rectal pain.  Genitourinary:  Negative for dysuria, flank pain and frequency.  Musculoskeletal:  Positive for myalgias.  Skin:  Negative for rash.  Neurological:  Positive for weakness. Negative for dizziness and headaches.    Physical Exam Triage Vital Signs ED Triage Vitals  Enc Vitals Group     BP 08/05/21 1012 (!) 165/99     Pulse Rate 08/05/21 1012 81     Resp 08/05/21 1012 14     Temp 08/05/21 1012 97.9 F (36.6 C)     Temp Source 08/05/21 1012 Oral     SpO2 08/05/21 1012 94 %     Weight --      Height --      Head Circumference --      Peak Flow --      Pain Score 08/05/21 1013 0     Pain Loc --      Pain Edu? --      Excl. in [place]? --    No data found.  Updated Vital Signs BP (!) 165/99 Comment: states ran out of HTN med 2 days ago   Pulse 81    Temp 97.9 F (36.6 C) (Oral)    Resp 14    SpO2 94%   Visual Acuity Right Eye Distance:   Left Eye Distance:   Bilateral Distance:    Right Eye Near:   Left Eye Near:    Bilateral Near:     Physical Exam Vitals reviewed.  Constitutional:      General: He is not in acute distress.    Appearance: Normal appearance. He is normal weight. He is not ill-appearing,  toxic-appearing or diaphoretic.  HENT:     Head: Normocephalic.     Mouth/Throat:     Mouth: Mucous membranes are moist.  Eyes:     General: No scleral icterus.    Conjunctiva/sclera: Conjunctivae normal.  Cardiovascular:     Rate and Rhythm: Normal rate.  Pulmonary:     Effort: Pulmonary effort is normal.  Abdominal:     General: Bowel sounds are normal. There is no distension.     Palpations: Abdomen is soft.  Tenderness: There is no abdominal tenderness. There is no right CVA tenderness or left CVA tenderness.  Musculoskeletal:        General: Normal range of motion.     Cervical back: Normal range of motion and neck supple.  Lymphadenopathy:     Cervical: No cervical adenopathy.  Skin:    General: Skin is warm and dry.     Findings: No rash.  Neurological:     General: No focal deficit present.     Mental Status: He is alert and oriented to person, place, and time.  Psychiatric:        Mood and Affect: Mood normal.        Behavior: Behavior normal.        Thought Content: Thought content normal.        Judgment: Judgment normal.     UC Treatments / Results  Labs (all labs ordered are listed, but only abnormal results are displayed) Labs Reviewed  CBC WITH DIFFERENTIAL/PLATELET - Abnormal; Notable for the following components:      Result Value   WBC 3.7 (*)    RBC 4.05 (*)    MCH 35.6 (*)    RDW 10.6 (*)    All other components within normal limits  POCT URINALYSIS DIPSTICK, ED / UC - Abnormal; Notable for the following components:   Bilirubin Urine SMALL (*)    Ketones, ur TRACE (*)    Protein, ur 30 (*)    Urobilinogen, UA 4.0 (*)    All other components within normal limits  SARS CORONAVIRUS 2 (TAT 6-24 HRS)  COMPREHENSIVE METABOLIC PANEL  LIPASE, BLOOD  POC INFLUENZA A AND B ANTIGEN (URGENT CARE ONLY)    EKG   Radiology No results found.  Procedures Procedures (including critical care time)  Medications Ordered in UC Medications - No data  to display  Initial Impression / Assessment and Plan / UC Course  I have reviewed the triage vital signs and the nursing notes.  Pertinent labs & imaging results that were available during my care of the patient were reviewed by me and considered in my medical decision making (see chart for details).    66 yo male with history of HTN and GERD who presents for evaluation of nausea, vomiting, chills, sweats, body aches, abdominal cramping, weakness and fatigue for the past 4 days. Symptoms are overall improving but still present.  Patient is afebrile and nontoxic-appearing.  Physical exam is unremarkable for any acute findings. He denies any fevers, hematemesis, melena, rectal bleeding, headache, dizziness, hematuria, urinary symptoms, back pain or flank pain.  He admits to daily alcohol consumption but has a negative CAGE screening.  No tobacco or illicit drug use.  Symptoms suggestive of norovirus but could also be early signs of liver disease.  Flu screening negative.  Urinalysis had a small amount of bilirubin and protein.  Trace ketones.  No nitrites or leukocytes.  CBC, CMP, lipase and COVID testing pending.  Advised supportive care measures. Discussed indications that would require immediate ED evaluation.  Patient advised to follow-up with PCP at the [facility] on Tuesday as already scheduled.    Today's evaluation has revealed no signs of a dangerous process. Discussed diagnosis with patient and/or guardian. Patient and/or guardian aware of their diagnosis, possible red flag symptoms to watch out for and need for close follow up. Patient and/or guardian understands verbal and written discharge instructions. Patient and/or guardian comfortable with plan and disposition.  Patient and/or guardian has a  clear mental status at this time, good insight into illness (after discussion and teaching) and has clear judgment to make decisions regarding their care  This care was provided during an unprecedented  National Emergency due to the Novel Coronavirus (COVID-19) pandemic. COVID-19 infections and transmission risks place heavy strains on healthcare resources.  As this pandemic evolves, our facility, providers, and staff strive to respond fluidly, to remain operational, and to provide care relative to available resources and information. Outcomes are unpredictable and treatments are without well-defined guidelines. Further, the impact of COVID-19 on all aspects of urgent care, including the impact to patients seeking care for reasons other than COVID-19, is unavoidable during this national emergency. At this time of the global pandemic, management of patients has significantly changed, even for non-COVID positive patients given high local and regional COVID volumes at this time requiring high healthcare system and resource utilization. The standard of care for management of both COVID suspected and non-COVID suspected patients continues to change rapidly at the local, regional, national, and global levels. This patient was worked up and treated to the best available but ever changing evidence and resources available at this current time.   Documentation was completed with the aid of voice recognition software. Transcription may contain typographical errors.  Final Clinical Impressions(s) / UC Diagnoses   Final diagnoses:  Norovirus  Encounter for screening for COVID-19     Discharge Instructions      You likely have norovirus which typically affects the digestive system.   Symptoms of this may include: Nausea, vomiting, and diarrhea. Stomach cramps. Fever. Chills. Headache. Muscle aches and tiredness.  There is no specific treatment for norovirus. Most people get better without treatment in about 2 days; but it may take up to 6 days to recover.  Drink plenty of water to prevent dehydration.  Drink clear fluids in small amounts as you are able. Clear fluids include water, ice chips, fruit  juice with water added and low-calorie sports drinks. Avoid fluids that contain a lot of sugar or caffeine, such as energy drinks, sports drinks, and soda. Avoid alcohol. Eat bland, easy-to-digest foods in small amounts as you are able. These foods include rice, lean meats, toast, and crackers. Avoid spicy or fatty foods.  Your COVID test and other blood work results are currently pending. Stay in home isolation until you receive results of your COVID test. You will only be notified for any positive/significant results. You may go online to [place] and review your results.   Follow-up with the [facility] on Tuesday as already scheduled. Make sure you let them know about your visit here today.   Go to the ED immediately if:  You have vomiting, diarrhea, or stomach pain that gets worse. You have a fever. You cannot drink without vomiting. You feel light-headed or dizzy. Your symptoms get worse. Excessive sleepiness. Very little urine production. Dry mouth. Confusion.     ED Prescriptions   None    PDMP not reviewed this encounter.   Enrique Sack, [place] 08/05/21 1218

## 2021-08-05 NOTE — ED Triage Notes (Signed)
Pt reports being sent home from work 3 days ago with chills, abd pain, vomiting, and "yellow eyes". Denies hx of alcoholism or liver problems. States abd pain has resolved, but continues with nausea and chills.  Unsure if fevers. C/O "it feels like glass going down my throat all the way down to my stomach every time I drink something", and hoarse voice "when I wake up in the morning". States has appt at [place] in few days.

## 2021-08-05 NOTE — Discharge Instructions (Addendum)
You likely have norovirus which typically affects the digestive system.   Symptoms of this may include: Nausea, vomiting, and diarrhea. Stomach cramps. Fever. Chills. Headache. Muscle aches and tiredness.  There is no specific treatment for norovirus. Most people get better without treatment in about 2 days; but it may take up to 6 days to recover.  Drink plenty of water to prevent dehydration.  Drink clear fluids in small amounts as you are able. Clear fluids include water, ice chips, fruit juice with water added and low-calorie sports drinks. Avoid fluids that contain a lot of sugar or caffeine, such as energy drinks, sports drinks, and soda. Avoid alcohol. Eat bland, easy-to-digest foods in small amounts as you are able. These foods include rice, lean meats, toast, and crackers. Avoid spicy or fatty foods.  Your COVID test and other blood work results are currently pending. Stay in home isolation until you receive results of your COVID test. You will only be notified for any positive/significant results. You may go online to [place] and review your results.   Follow-up with the [facility] on Tuesday as already scheduled. Make sure you let them know about your visit here today.   Go to the ED immediately if:  You have vomiting, diarrhea, or stomach pain that gets worse. You have a fever. You cannot drink without vomiting. You feel light-headed or dizzy. Your symptoms get worse. Excessive sleepiness. Very little urine production. Dry mouth. Confusion.

## 2021-08-06 LAB — SARS CORONAVIRUS 2 (TAT 6-24 HRS): SARS Coronavirus 2: POSITIVE — AB

## 2021-08-20 ENCOUNTER — Ambulatory Visit (HOSPITAL_COMMUNITY)
Admission: EM | Admit: 2021-08-20 | Discharge: 2021-08-20 | Disposition: A | Discharge status: Home / Self Care | Payer: No Typology Code available for payment source | Attending: Physician Assistant | Admitting: Physician Assistant

## 2021-08-20 ENCOUNTER — Encounter (HOSPITAL_COMMUNITY): Payer: Self-pay | Admitting: Emergency Medicine

## 2021-08-20 ENCOUNTER — Other Ambulatory Visit: Payer: Self-pay

## 2021-08-20 DIAGNOSIS — K649 Unspecified hemorrhoids: Secondary | ICD-10-CM

## 2021-08-20 MED ORDER — HYDROCORTISONE (PERIANAL) 2.5 % EX CREA: 1.0000 "application " | TOPICAL_CREAM | Freq: Two times a day (BID) | CUTANEOUS | 0 refills | Status: AC

## 2021-08-20 NOTE — ED Triage Notes (Addendum)
Reports having hemorrhoid issues that started yesterday, pain, blood noticed in bed.  Reports having hard bowel movements

## 2021-08-20 NOTE — Discharge Instructions (Signed)
°  Use miralax to  improve constipation.

## 2021-08-20 NOTE — ED Provider Notes (Signed)
[name]    CSN: [id number] Arrival date & time: 08/20/21  1233      History   Chief Complaint Chief Complaint  Patient presents with   Hemorrhoids    HPI Jeremy Hayden is a 66 y.o. male.   Patient here today for evaluation of hemorrhoid pain that started yesterday. He notes he did try Vaseline with mild relief. Stools have been more firm. He has not had any nausea or vomiting. He reports symptoms are somewhat better today.  The history is provided by the patient.   Past Medical History:  Diagnosis Date   Cancer [facility])    prostate   GERD (gastroesophageal reflux disease)    Hypertension     Patient Active Problem List   Diagnosis Date Noted   Hematemesis with nausea    Dehydration 03/26/2015   Chest pain 03/26/2015   Gastroenteritis 03/26/2015   Paroxysmal atrial fibrillation ([place]) 03/26/2015   Blood in stool    TINEA CAPITIS 07/01/2006   HYPERLIPIDEMIA 07/01/2006   Hypertension 07/01/2006   GERD 07/01/2006   [place] DISEASE, CERVICAL 07/01/2006    Past Surgical History:  Procedure Laterality Date   ESOPHAGOGASTRODUODENOSCOPY N/A 03/27/2015   Procedure: ESOPHAGOGASTRODUODENOSCOPY (EGD);  Surgeon: Manus Gunning, MD;  Location: [facility];  Service: Gastroenterology;  Laterality: N/A;   NECK SURGERY  11/2001   multilevel c spine diskectomy, decompression, arthrodesis with bone graft and plate placed.  Dr Rennis Harding   PROSTATE SURGERY     ROTATOR CUFF REPAIR Right 2003   with partial acromiectomy/plasty       Home Medications    Prior to Admission medications   Medication Sig Start Date End Date Taking? Authorizing Provider  hydrocortisone (ANUSOL-HC) 2.5 % rectal cream Place 1 application rectally 2 (two) times daily. 08/20/21  Yes Francene Finders, PA-C  Acetaminophen (STANBACK ASPIRIN FREE PO) Take by mouth.    [provider]  amLODipine (NORVASC) 5 MG tablet Take 5 mg by mouth daily.    [provider]   hydrocortisone (ANUSOL-HC) 25 MG suppository Place 1 suppository (25 mg total) rectally 2 (two) times daily as needed for hemorrhoids or anal itching. Patient not taking: Reported on 08/20/2021 06/19/20   Chase Picket, MD  OMEPRAZOLE PO Take by mouth.    [provider]  ondansetron (ZOFRAN ODT) 4 MG disintegrating tablet Take 1 tablet (4 mg total) by mouth every 8 (eight) hours as needed for nausea or vomiting. 02/26/21   Lamptey, Myrene Galas, MD  senna-docusate (SENOKOT-S) 8.6-50 MG tablet Take 1 tablet by mouth daily. 06/19/20   Lamptey, Myrene Galas, MD    Family History Family History  Problem Relation Age of Onset   Ovarian cancer Mother    Cancer - Colon Father    Prostate cancer Father    Brain cancer Sister    Sickle cell anemia Brother     Social History Social History   Tobacco Use   Smoking status: Never   Smokeless tobacco: Never  Vaping Use   Vaping Use: Never used  Substance Use Topics   Alcohol use: Yes    Alcohol/week: 24.0 standard drinks    Types: 24 Cans of beer per week    Comment: "a little bit...no liquor"   Drug use: No     Allergies   Patient has no known allergies.   Review of Systems Review of Systems  Constitutional:  Negative for chills and fever.  Eyes:  Negative for discharge and  redness.  Respiratory:  Negative for shortness of breath.   Gastrointestinal:  Positive for anal bleeding, constipation and rectal pain. Negative for blood in stool, nausea and vomiting.    Physical Exam Triage Vital Signs ED Triage Vitals  Enc Vitals Group     BP 08/20/21 1443 (!) 146/84     Pulse Rate 08/20/21 1443 84     Resp 08/20/21 1443 20     Temp 08/20/21 1443 98.1 F (36.7 C)     Temp Source 08/20/21 1443 Oral     SpO2 08/20/21 1443 97 %     Weight --      Height --      Head Circumference --      Peak Flow --      Pain Score 08/20/21 1440 8     Pain Loc --      Pain Edu? --      Excl. in [place]? --    No data found.  Updated Vital  Signs BP (!) 146/84 (BP Location: Right Arm)    Pulse 84    Temp 98.1 F (36.7 C) (Oral)    Resp 20    SpO2 97%      Physical Exam Vitals and nursing note reviewed.  Constitutional:      General: He is not in acute distress.    Appearance: Normal appearance. He is not ill-appearing.  HENT:     Head: Normocephalic and atraumatic.  Cardiovascular:     Rate and Rhythm: Normal rate.  Pulmonary:     Effort: Pulmonary effort is normal.  Genitourinary:    Rectum: External hemorrhoid (nonthrombosed) present.  Neurological:     Mental Status: He is alert.     UC Treatments / Results  Labs (all labs ordered are listed, but only abnormal results are displayed) Labs Reviewed - No data to display  EKG   Radiology No results found.  Procedures Procedures (including critical care time)  Medications Ordered in UC Medications - No data to display  Initial Impression / Assessment and Plan / UC Course  I have reviewed the triage vital signs and the nursing notes.  Pertinent labs & imaging results that were available during my care of the patient were reviewed by me and considered in my medical decision making (see chart for details).    Will trial anusol topically for symptoms and discussed importance of improving constipation. Recommended miralax for treatment. Encouraged follow up here or with PCP with any concerns.   Final Clinical Impressions(s) / UC Diagnoses   Final diagnoses:  Hemorrhoids, unspecified hemorrhoid type     Discharge Instructions       Use miralax to  improve constipation.        ED Prescriptions     Medication Sig Dispense Auth. Provider   hydrocortisone (ANUSOL-HC) 2.5 % rectal cream Place 1 application rectally 2 (two) times daily. 30 g Francene Finders, PA-C      PDMP not reviewed this encounter.   Francene Finders, PA-C 08/20/21 1528

## 2021-08-27 ENCOUNTER — Emergency Department (HOSPITAL_COMMUNITY)
Admission: EM | Admit: 2021-08-27 | Discharge: 2021-08-27 | Disposition: A | Discharge status: Home / Self Care | Payer: No Typology Code available for payment source | Attending: Emergency Medicine | Admitting: Emergency Medicine

## 2021-08-27 ENCOUNTER — Other Ambulatory Visit: Payer: Self-pay

## 2021-08-27 ENCOUNTER — Encounter (HOSPITAL_COMMUNITY): Payer: Self-pay

## 2021-08-27 DIAGNOSIS — K649 Unspecified hemorrhoids: Secondary | ICD-10-CM | POA: Insufficient documentation

## 2021-08-27 DIAGNOSIS — I10 Essential (primary) hypertension: Secondary | ICD-10-CM | POA: Diagnosis not present

## 2021-08-27 DIAGNOSIS — Z79899 Other long term (current) drug therapy: Secondary | ICD-10-CM | POA: Diagnosis not present

## 2021-08-27 LAB — I-STAT CHEM 8, ED
BUN: 6 mg/dL — ABNORMAL LOW (ref 8–23)
Calcium, Ion: 1.11 mmol/L — ABNORMAL LOW (ref 1.15–1.40)
Chloride: 103 mmol/L (ref 98–111)
Creatinine, Ser: 1 mg/dL (ref 0.61–1.24)
Glucose, Bld: 112 mg/dL — ABNORMAL HIGH (ref 70–99)
HCT: 46 % (ref 39.0–52.0)
Hemoglobin: 15.6 g/dL (ref 13.0–17.0)
Potassium: 3.7 mmol/L (ref 3.5–5.1)
Sodium: 141 mmol/L (ref 135–145)
TCO2: 24 mmol/L (ref 22–32)

## 2021-08-27 LAB — POC OCCULT BLOOD, ED: Fecal Occult Bld: NEGATIVE

## 2021-08-27 MED ORDER — POLYETHYLENE GLYCOL 3350 17 G PO PACK: 17.0000 g | PACK | Freq: Every day | ORAL | 0 refills | Status: DC

## 2021-08-27 NOTE — ED Triage Notes (Signed)
Patient reports that he has external hemorrhoids since yesterday. Patient c/o increased pain and bleeding from his hemorrhoids. Patient reports that he was prescribed Anusol cream yesterday for the same.

## 2021-08-27 NOTE — ED Provider Notes (Signed)
[place] DEPT Provider Note   CSN: [id number] Arrival date & time: 08/27/21  1410     History  Chief Complaint  Patient presents with   Hemorrhoids    Jeremy Hayden is a 66 y.o. male.  HPI   Pt is a 66 y/o male wit ha h/o CA, GERd, HTN , pAF, who presents to the ED today for eval of hemorrhoids that started about 1 week ago. He was given cream for this at urgent care but his sxs persist. Rates pain 9/10.   He reports associated hard stools. He is not currently taking any stool softeners. He reports he had some rectal bleeding one week ago when his sxs started but this his improved and he states he is only seeing streaks of bright red blood. Denies NV.  Home Medications Prior to Admission medications   Medication Sig Start Date End Date Taking? Authorizing Provider  polyethylene glycol (MIRALAX) 17 g packet Take 17 g by mouth daily. Dissolve one cap full in solution (water, gatorade, etc.) and administer once cap-full daily. You may titrate up daily by 1 cap-full until the patient is having pudding consistency of stools. After the patient is able to start passing softer stools they will need to be on 1/2 cap-full daily for 2 weeks. 08/27/21  Yes Lenaya Pietsch S, PA-C  Acetaminophen (STANBACK ASPIRIN FREE PO) Take by mouth.    [provider]  amLODipine (NORVASC) 5 MG tablet Take 5 mg by mouth daily.    [provider]  hydrocortisone (ANUSOL-HC) 2.5 % rectal cream Place 1 application rectally 2 (two) times daily. 08/20/21   Francene Finders, PA-C  OMEPRAZOLE PO Take by mouth.    [provider]  ondansetron (ZOFRAN ODT) 4 MG disintegrating tablet Take 1 tablet (4 mg total) by mouth every 8 (eight) hours as needed for nausea or vomiting. 02/26/21   Lamptey, Myrene Galas, MD  senna-docusate (SENOKOT-S) 8.6-50 MG tablet Take 1 tablet by mouth daily. 06/19/20   Lamptey, Myrene Galas, MD      Allergies    Patient has no known  allergies.    Review of Systems   Review of Systems See HPI for pertinent positives or negatives.   Physical Exam Updated Vital Signs BP (!) 150/96 (BP Location: Left Arm)    Pulse 97    Temp 98.2 F (36.8 C) (Oral)    Resp 16    Ht 5\' 3"  (1.6 m)    Wt 59 kg    SpO2 100%    BMI 23.03 kg/m  Physical Exam Constitutional:      General: He is not in acute distress.    Appearance: He is well-developed.  Eyes:     Conjunctiva/sclera: Conjunctivae normal.  Cardiovascular:     Rate and Rhythm: Normal rate and regular rhythm.  Pulmonary:     Effort: Pulmonary effort is normal.     Breath sounds: Normal breath sounds.  Abdominal:     General: Abdomen is flat.     Tenderness: There is abdominal tenderness (mild lower abd discomfort (chronic per patient)). There is no guarding or rebound.  Genitourinary:    Comments: Chaperone present. DRE performed. Small nonthrombosed external hemorrhoid noted at the 7 oclock position. Skin:    General: Skin is warm and dry.  Neurological:     Mental Status: He is alert and oriented to person, place, and time.    ED Results / Procedures / Treatments   Labs (  all labs ordered are listed, but only abnormal results are displayed) Labs Reviewed  I-STAT CHEM 8, ED - Abnormal; Notable for the following components:      Result Value   BUN 6 (*)    Glucose, Bld 112 (*)    Calcium, Ion 1.11 (*)    All other components within normal limits  POC OCCULT BLOOD, ED    EKG None  Radiology No results found.  Procedures Procedures    Medications Ordered in ED Medications - No data to display  ED Course/ Medical Decision Making/ A&P                           Medical Decision Making  66 y/o male presents to the ED today for eval of hemorrhoids.  Ddx includes perirectal abscess, complicated gi bleed, thrombosed hemorrhoid. Seen last week for same and given Anusol.  He was recommended to start MiraLAX however he did not so he still is having hard  stools.  He denies any other complaints at this time.  He was initially mildly tachycardic however this resolved after being in the ED for several hours. His hgb is wnl. No acute blood loss. Hemoccult is negative. He is nontoxic appearing and in no acute distress.   Feel this can be treated as outpatient.  Will give Rx for MiraLAX and Proctofoam. Follow-up with GI or his PCP.  Advised on return precautions     Final Clinical Impression(s) / ED Diagnoses Final diagnoses:  Hemorrhoids, unspecified hemorrhoid type    Rx / DC Orders ED Discharge Orders          Ordered    polyethylene glycol (MIRALAX) 17 g packet  Daily        08/27/21 2301              Rodney Booze, PA-C 08/27/21 2303    Daleen Bo, MD 08/28/21 1232

## 2021-08-27 NOTE — Discharge Instructions (Addendum)
Continue to use Anusol cream. You should also start doing sitz baths at home as well.   You were given prescription for Miralax to help soften your stools which will help with the pain and help prevent any new hemorrhoids from forming   Please follow up with your primary care provider within 5-7 days for re-evaluation of your symptoms.  Please return to the emergency department for any new or worsening symptoms.

## 2021-08-27 NOTE — ED Notes (Signed)
Also sent a light green and lavender to lab.

## 2021-08-27 NOTE — ED Provider Triage Note (Signed)
Emergency Medicine Provider Triage Evaluation Note  Jeremy Hayden , a 66 y.o. male  was evaluated in triage.  Pt complains of rectal pain and bleeding.  Was seen at urgent care yesterday and prescribed Anusol cream, he reports no improvement.  Reports he is seeing blood in the toilet bowl anytime he has a bowel movement, and reports pain in his rectum has gotten more severe.  No associated abdominal pain.  Not on any blood thinners.  Review of Systems  Positive: Rectal pain, rectal bleeding Negative: Abdominal pain  Physical Exam  BP (!) 141/98 (BP Location: Left Arm)    Pulse (!) 103    Temp 98.2 F (36.8 C) (Oral)    Resp 18    SpO2 97%  Gen:   Awake, no distress   Resp:  Normal effort  MSK:   Moves extremities without difficulty  Other:  No abdominal tenderness, rectal exam deferred in triage.  Medical Decision Making  Medically screening exam initiated at 2:23 PM.  Appropriate orders placed.  Jeremy Hayden was informed that the remainder of the evaluation will be completed by another provider, this initial triage assessment does not replace that evaluation, and the importance of remaining in the ED until their evaluation is complete.  Will send i-STAT Chem-8 to check patient's hemoglobin given her reported bleeding   Jeremy Larsen, PA-C 08/27/21 1427

## 2021-10-28 ENCOUNTER — Encounter (HOSPITAL_COMMUNITY): Payer: Self-pay

## 2021-10-28 ENCOUNTER — Emergency Department (HOSPITAL_COMMUNITY): Payer: No Typology Code available for payment source

## 2021-10-28 ENCOUNTER — Other Ambulatory Visit: Payer: Self-pay

## 2021-10-28 ENCOUNTER — Emergency Department (HOSPITAL_COMMUNITY)
Admission: EM | Admit: 2021-10-28 | Discharge: 2021-10-28 | Disposition: A | Discharge status: Home / Self Care | Payer: No Typology Code available for payment source | Attending: Student | Admitting: Student

## 2021-10-28 DIAGNOSIS — S0181XA Laceration without foreign body of other part of head, initial encounter: Secondary | ICD-10-CM

## 2021-10-28 DIAGNOSIS — Y9301 Activity, walking, marching and hiking: Secondary | ICD-10-CM | POA: Diagnosis not present

## 2021-10-28 DIAGNOSIS — Y92480 Sidewalk as the place of occurrence of the external cause: Secondary | ICD-10-CM | POA: Insufficient documentation

## 2021-10-28 DIAGNOSIS — M542 Cervicalgia: Secondary | ICD-10-CM | POA: Insufficient documentation

## 2021-10-28 DIAGNOSIS — S0990XA Unspecified injury of head, initial encounter: Secondary | ICD-10-CM | POA: Diagnosis present

## 2021-10-28 DIAGNOSIS — W19XXXA Unspecified fall, initial encounter: Secondary | ICD-10-CM

## 2021-10-28 DIAGNOSIS — S060X1A Concussion with loss of consciousness of 30 minutes or less, initial encounter: Secondary | ICD-10-CM

## 2021-10-28 DIAGNOSIS — W01198A Fall on same level from slipping, tripping and stumbling with subsequent striking against other object, initial encounter: Secondary | ICD-10-CM | POA: Diagnosis not present

## 2021-10-28 MED ORDER — CEPHALEXIN 250 MG PO CAPS
500.0000 mg | ORAL_CAPSULE | Freq: Once | ORAL | Status: AC
  Administered 2021-10-28: 500 mg via ORAL
  Filled 2021-10-28: qty 2

## 2021-10-28 MED ORDER — ACETAMINOPHEN 325 MG PO TABS
650.0000 mg | ORAL_TABLET | Freq: Once | ORAL | Status: AC
  Administered 2021-10-28: 650 mg via ORAL
  Filled 2021-10-28: qty 2

## 2021-10-28 MED ORDER — CEPHALEXIN 500 MG PO CAPS: 500.0000 mg | ORAL_CAPSULE | Freq: Three times a day (TID) | ORAL | 0 refills | Status: AC

## 2021-10-28 MED ORDER — LIDOCAINE-EPINEPHRINE (PF) 2 %-1:200000 IJ SOLN
10.0000 mL | Freq: Once | INTRAMUSCULAR | Status: AC
  Administered 2021-10-28: 10 mL
  Filled 2021-10-28: qty 20

## 2021-10-28 NOTE — ED Provider Notes (Signed)
?[facility] ?Provider Note ? ? ?CSN: [id number] ?Arrival date & time: 10/28/21  1302 ? ?  ? ?History ? ?No chief complaint on file. ? ? ?Jeremy Hayden is a 66 y.o. male. ? ?66 year old male presents today for evaluation following mechanical fall that occurred just prior to arrival.  Patient reports he was walking along the sidewalk when he came across uneven pavement and tripped and fell forward hitting his face.  He is complaining of headache and right-sided neck pain but otherwise denies any complaints.  He endorses loss of consciousness.  He states he might of been passed out for about 3 minutes.  He recalls the entirety of the fall and a bystander coming along and checking up on him.  He is unable to quantify why he thinks he passed out.  He denies chest pain, shortness of breath, palpitations or other complaints at this time.  He has a laceration to his forehead.  No other injuries. ? ?The history is provided by the patient. No language interpreter was used.  ? ?  ? ?Home Medications ?Prior to Admission medications   ?Medication Sig Start Date End Date Taking? Authorizing Provider  ?Acetaminophen (STANBACK ASPIRIN FREE PO) Take by mouth.    [provider]  ?amLODipine (NORVASC) 5 MG tablet Take 5 mg by mouth daily.    [provider]  ?hydrocortisone (ANUSOL-HC) 2.5 % rectal cream Place 1 application rectally 2 (two) times daily. 08/20/21   Francene Finders, PA-C  ?OMEPRAZOLE PO Take by mouth.    [provider]  ?ondansetron (ZOFRAN ODT) 4 MG disintegrating tablet Take 1 tablet (4 mg total) by mouth every 8 (eight) hours as needed for nausea or vomiting. 02/26/21   Lamptey, Myrene Galas, MD  ?polyethylene glycol (MIRALAX) 17 g packet Take 17 g by mouth daily. Dissolve one cap full in solution (water, gatorade, etc.) and administer once cap-full daily. You may titrate up daily by 1 cap-full until the patient is having pudding consistency of stools.  After the patient is able to start passing softer stools they will need to be on 1/2 cap-full daily for 2 weeks. 08/27/21   Couture, Cortni S, PA-C  ?senna-docusate (SENOKOT-S) 8.6-50 MG tablet Take 1 tablet by mouth daily. 06/19/20   Lamptey, Myrene Galas, MD  ?   ? ?Allergies    ?Patient has no known allergies.   ? ?Review of Systems   ?Review of Systems  ?Constitutional:  Negative for chills and fever.  ?Respiratory:  Negative for shortness of breath.   ?Cardiovascular:  Negative for chest pain, palpitations and leg swelling.  ?Gastrointestinal:  Negative for abdominal pain and nausea.  ?Neurological:  Positive for syncope and headaches. Negative for weakness.  ?All other systems reviewed and are negative. ? ?Physical Exam ?Updated Vital Signs ?BP 133/89 (BP Location: Right Arm)   Pulse 98   Temp 97.8 ?F (36.6 ?C) (Oral)   Resp 18   SpO2 100%  ?Physical Exam ?Vitals and nursing note reviewed.  ?Constitutional:   ?   General: He is not in acute distress. ?   Appearance: Normal appearance. He is not ill-appearing.  ?HENT:  ?   Head: Normocephalic and atraumatic.  ?   Nose: Nose normal.  ?Eyes:  ?   General: No scleral icterus. ?   Extraocular Movements: Extraocular movements intact.  ?   Conjunctiva/sclera: Conjunctivae normal.  ?Cardiovascular:  ?   Rate and Rhythm: Normal rate and regular rhythm.  ?  Pulses: Normal pulses.  ?   Heart sounds: Normal heart sounds.  ?Pulmonary:  ?   Effort: Pulmonary effort is normal. No respiratory distress.  ?   Breath sounds: Normal breath sounds. No wheezing or rales.  ?Abdominal:  ?   General: There is no distension.  ?   Tenderness: There is no abdominal tenderness.  ?Musculoskeletal:     ?   General: Normal range of motion.  ?   Cervical back: Normal range of motion.  ?   Right lower leg: No edema.  ?   Left lower leg: No edema.  ?   Comments: Patient with full range of motion in bilateral upper and lower extremities.  He is able to ambulate without difficulty.  ?Skin: ?    General: Skin is warm and dry.  ?Neurological:  ?   General: No focal deficit present.  ?   Mental Status: He is alert. Mental status is at baseline.  ? ? ?ED Results / Procedures / Treatments   ?Labs ?(all labs ordered are listed, but only abnormal results are displayed) ?Labs Reviewed - No data to display ? ?EKG ?None ? ?Radiology ?DG Chest 1 View ? ?Result Date: 10/28/2021 ?CLINICAL DATA:  Trauma, fall EXAM: CHEST  1 VIEW COMPARISON:  09/15/2018 FINDINGS: The heart size and mediastinal contours are within normal limits. Both lungs are clear. The visualized skeletal structures are unremarkable. IMPRESSION: No active disease. Electronically Signed   By: Elmer Picker M.D.   On: 10/28/2021 14:34  ? ?DG Shoulder Right ? ?Result Date: 10/28/2021 ?CLINICAL DATA:  Trauma, fall EXAM: RIGHT SHOULDER - 2+ VIEW COMPARISON:  None. FINDINGS: No fracture or dislocation is seen. IMPRESSION: No fracture or dislocation is seen in the right shoulder. Electronically Signed   By: Elmer Picker M.D.   On: 10/28/2021 14:34  ? ?CT HEAD WO CONTRAST (5MM) ? ?Result Date: 10/28/2021 ?CLINICAL DATA:  Head trauma, minor (Age >= 65y); Facial trauma, blunt; Neck trauma (Age >= 65y) EXAM: CT HEAD WITHOUT CONTRAST CT MAXILLOFACIAL WITHOUT CONTRAST CT CERVICAL SPINE WITHOUT CONTRAST TECHNIQUE: Multidetector CT imaging of the head, cervical spine, and maxillofacial structures were performed using the standard protocol without intravenous contrast. Multiplanar CT image reconstructions of the cervical spine and maxillofacial structures were also generated. RADIATION DOSE REDUCTION: This exam was performed according to the departmental dose-optimization program which includes automated exposure control, adjustment of the mA and/or kV according to patient size and/or use of iterative reconstruction technique. COMPARISON:  CT 10/07/2015 FINDINGS: CT HEAD FINDINGS Brain: No evidence of acute intracranial hemorrhage or extra-axial collection.No  evidence of mass lesion/concerning mass effect.The ventricles are normal in size.Scattered subcortical and periventricular white matter hypodensities, nonspecific but likely sequela of chronic small vessel ischemic disease.Unchanged left intake nucleus calcification. Vascular: Vascular calcifications.  No hyperdense vessel. Skull: Negative for skull fracture. Other: None CT MAXILLOFACIAL FINDINGS Osseous: No fracture or mandibular dislocation. No destructive process. Orbits: Negative. No traumatic or inflammatory finding. Sinuses: Minimal ethmoid air cell mucosal thickening. The mastoid air cells are clear. Soft tissues: There is focal forehead scalp soft tissue swelling. CT CERVICAL SPINE FINDINGS Alignment: Trace degenerative anterolisthesis at C7-T1. Skull base and vertebrae: Prior C4-C6 ACDF with solid arthrodesis. Intact hardware without evidence of loosening. There is no acute cervical spine fracture. Soft tissues and spinal canal: No prevertebral fluid or swelling. No visible canal hematoma. Disc levels: There is mild-to-moderate degenerative disc disease above and below the ACDF, worst at C7-T1. Upper chest: Negative. Other: None IMPRESSION:  No acute intracranial abnormality. Chronic small vessel ischemic disease. No acute facial fracture.  Focal forehead soft tissue swelling. No acute cervical spine fracture. Prior C4-C6 ACDF without evidence of hardware complication. Mild-to-moderate degenerative disc disease above below the ACDF, worst at C7-T1. Electronically Signed   By: Maurine Simmering M.D.   On: 10/28/2021 14:37  ? ?CT Cervical Spine Wo Contrast ? ?Result Date: 10/28/2021 ?CLINICAL DATA:  Head trauma, minor (Age >= 65y); Facial trauma, blunt; Neck trauma (Age >= 65y) EXAM: CT HEAD WITHOUT CONTRAST CT MAXILLOFACIAL WITHOUT CONTRAST CT CERVICAL SPINE WITHOUT CONTRAST TECHNIQUE: Multidetector CT imaging of the head, cervical spine, and maxillofacial structures were performed using the standard protocol  without intravenous contrast. Multiplanar CT image reconstructions of the cervical spine and maxillofacial structures were also generated. RADIATION DOSE REDUCTION: This exam was performed according to the departme

## 2021-10-28 NOTE — Discharge Instructions (Addendum)
Your work-up today was reassuring.  Your head scan, scan of the face, scan of your neck, chest x-ray, right shoulder x-ray did not show any concerning findings.  You did have a laceration to your forehead which was repaired with 6 stitches.  These will need to be removed in 7 days.  You can go to an urgent care, return to the emergency room or follow-up with your PCP.  Your EKG was done and showed ?

## 2021-10-28 NOTE — ED Provider Triage Note (Signed)
Emergency Medicine Provider Triage Evaluation Note ? ?Jeremy Hayden , a 66 y.o. male  was evaluated in triage.  Pt complains of head injury.  Patient tripped on the curb earlier today falling striking his forehead on the ground suffering a laceration.  Bystander helped him up, he reports he has had a mild frontal headache since that time along with right shoulder pain.  Patient believes he may have lost consciousness briefly. ? ?Review of Systems  ?Positive: Head injury, head laceration, right shoulder pain ?Negative: Blood thinner use, chest pain, abdominal pain, pelvic pain or any additional concerns. ? ?Physical Exam  ?BP 109/75   Pulse 87   Temp 97.8 ?F (36.6 ?C) (Oral)   Resp 18   SpO2 100%  ?Gen:   Awake, no distress   ?Resp:  Normal effort  ?MSK:   Moves extremities without difficulty  ?Other:  2 cm laceration between eyebrows.  Bleeding controlled with direct pressure.  No midline spinal tenderness palpation.  No crepitus step-off or deformity the spine.  No trismus.  EOMI without pain. ? ?Medical Decision Making  ?Medically screening exam initiated at 2:00 PM.  Appropriate orders placed.  Jeremy Hayden was informed that the remainder of the evaluation will be completed by another provider, this initial triage assessment does not replace that evaluation, and the importance of remaining in the ED until their evaluation is complete. ? ? ? ?Note: Portions of this report may have been transcribed using voice recognition software. Every effort was made to ensure accuracy; however, inadvertent computerized transcription errors may still be present. ? ?  ?Deliah Boston, PA-C ?10/28/21 1403 ? ?

## 2021-10-28 NOTE — ED Triage Notes (Signed)
Patient suffered a mechanical fall today while walking on sidewalk falling face forward. Small laceration to forehead and complains of facial pain, no loc ?

## 2022-01-18 ENCOUNTER — Emergency Department (HOSPITAL_COMMUNITY)
Admission: EM | Admit: 2022-01-18 | Discharge: 2022-01-18 | Discharge status: Against Medical Advice | Payer: No Typology Code available for payment source

## 2022-01-18 ENCOUNTER — Ambulatory Visit (HOSPITAL_COMMUNITY)
Admission: RE | Admit: 2022-01-18 | Discharge: 2022-01-18 | Disposition: A | Discharge status: Home / Self Care | Payer: No Typology Code available for payment source | Source: Ambulatory Visit | Attending: Internal Medicine | Admitting: Internal Medicine

## 2022-01-18 ENCOUNTER — Encounter (HOSPITAL_COMMUNITY): Payer: Self-pay

## 2022-01-18 VITALS — BP 154/88 | HR 91 | Resp 18

## 2022-01-18 DIAGNOSIS — R55 Syncope and collapse: Secondary | ICD-10-CM

## 2022-01-18 MED ORDER — ACETAMINOPHEN 325 MG PO TABS
975.0000 mg | ORAL_TABLET | Freq: Once | ORAL | Status: AC
  Administered 2022-01-18: 975 mg via ORAL

## 2022-01-18 MED ORDER — ACETAMINOPHEN 325 MG PO TABS
ORAL_TABLET | ORAL | Status: AC
  Filled 2022-01-18: qty 3

## 2022-01-18 NOTE — ED Notes (Signed)
Pt states they are leaving  

## 2022-01-18 NOTE — ED Provider Notes (Signed)
Patient presents to urgent care for evaluation after falling yesterday around 2 PM and hitting his head.  Patient states that he had just finished mowing the lawn and was attempting to put his push lawnmower up when he fell onto the concrete, hit his head to his nasal bridge, and passed out.  He states that his neighbor witnessed the syncopal event and helped him afterwards.  He states that his nose bled for approximately 20 minutes after the fall, but he was able to get the bleeding to stop.  He reports increased frequency of syncopal events over the last couple of months.  He talked to his doctor at the [place] about this, but has not seen a neurologist and has not had a brain scan.  Denies nausea, vomiting, blurry vision, decreased visual acuity, and fever/chills recently.  He has hypertension, but has not taken his amlodipine this morning.  He is reporting a frontal headache that is a 7 on a scale of 0-10 at this time.  He took some Tylenol this morning at 7:30 AM, but this is worn off at this time.  Patient given 1000 mg of Tylenol at urgent care in triage.   Recommend patient go to the nearest emergency department for further evaluation and management of syncopal event.  He is currently reporting headache and dizziness.  I am not comfortable letting patient walk to the emergency department across the street.  Patient called his neighbor Elenore Rota to have him bring him to the ER and dropped him off at the front entrance.  Offered patient to [facility] transport, but patient declined due to cost.  Comfortable sending patient to the ER via personal vehicle and discussed risks of deferring emergency department visit tonight.  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  Patient verbalizes understanding and agreement with plan.  Discharged from urgent care in stable condition.   Talbot Grumbling, [place] 01/18/22 1734

## 2022-01-18 NOTE — ED Triage Notes (Signed)
Onset 2 days ago pt reports passing out yesterday after mowing the grass.Pt reports hitting his forehead and pass out. He does report a nose bleed for approximately 20 minutes.

## 2022-01-18 NOTE — Discharge Instructions (Signed)
Please go to the nearest emergency department for evaluation since you passed out yesterday and hit your head.

## 2022-03-20 ENCOUNTER — Ambulatory Visit (HOSPITAL_COMMUNITY): Payer: No Typology Code available for payment source

## 2022-04-05 ENCOUNTER — Inpatient Hospital Stay (HOSPITAL_COMMUNITY)
Admission: EM | Admit: 2022-04-05 | Discharge: 2022-04-11 | DRG: 312 | Disposition: A | Discharge status: Home / Self Care | Payer: No Typology Code available for payment source | Attending: Internal Medicine | Admitting: Internal Medicine

## 2022-04-05 ENCOUNTER — Emergency Department (HOSPITAL_COMMUNITY): Payer: No Typology Code available for payment source

## 2022-04-05 ENCOUNTER — Ambulatory Visit (HOSPITAL_COMMUNITY)
Admission: EM | Admit: 2022-04-05 | Discharge: 2022-04-05 | Disposition: A | Discharge status: Home / Self Care | Payer: No Typology Code available for payment source

## 2022-04-05 ENCOUNTER — Observation Stay (HOSPITAL_COMMUNITY): Payer: No Typology Code available for payment source

## 2022-04-05 ENCOUNTER — Other Ambulatory Visit: Payer: Self-pay

## 2022-04-05 ENCOUNTER — Encounter (HOSPITAL_COMMUNITY): Payer: Self-pay | Admitting: Emergency Medicine

## 2022-04-05 ENCOUNTER — Encounter (HOSPITAL_COMMUNITY): Payer: Self-pay

## 2022-04-05 DIAGNOSIS — Z79899 Other long term (current) drug therapy: Secondary | ICD-10-CM

## 2022-04-05 DIAGNOSIS — E876 Hypokalemia: Secondary | ICD-10-CM | POA: Diagnosis present

## 2022-04-05 DIAGNOSIS — I10 Essential (primary) hypertension: Secondary | ICD-10-CM | POA: Diagnosis present

## 2022-04-05 DIAGNOSIS — Z23 Encounter for immunization: Secondary | ICD-10-CM

## 2022-04-05 DIAGNOSIS — Z832 Family history of diseases of the blood and blood-forming organs and certain disorders involving the immune mechanism: Secondary | ICD-10-CM

## 2022-04-05 DIAGNOSIS — Z8546 Personal history of malignant neoplasm of prostate: Secondary | ICD-10-CM

## 2022-04-05 DIAGNOSIS — I480 Paroxysmal atrial fibrillation: Secondary | ICD-10-CM | POA: Diagnosis present

## 2022-04-05 DIAGNOSIS — I493 Ventricular premature depolarization: Secondary | ICD-10-CM | POA: Diagnosis present

## 2022-04-05 DIAGNOSIS — K219 Gastro-esophageal reflux disease without esophagitis: Secondary | ICD-10-CM | POA: Diagnosis present

## 2022-04-05 DIAGNOSIS — R55 Syncope and collapse: Secondary | ICD-10-CM

## 2022-04-05 DIAGNOSIS — I071 Rheumatic tricuspid insufficiency: Secondary | ICD-10-CM | POA: Diagnosis present

## 2022-04-05 DIAGNOSIS — Z808 Family history of malignant neoplasm of other organs or systems: Secondary | ICD-10-CM

## 2022-04-05 DIAGNOSIS — I951 Orthostatic hypotension: Principal | ICD-10-CM | POA: Diagnosis present

## 2022-04-05 DIAGNOSIS — Z8042 Family history of malignant neoplasm of prostate: Secondary | ICD-10-CM

## 2022-04-05 DIAGNOSIS — G909 Disorder of the autonomic nervous system, unspecified: Secondary | ICD-10-CM | POA: Diagnosis present

## 2022-04-05 DIAGNOSIS — Z8041 Family history of malignant neoplasm of ovary: Secondary | ICD-10-CM

## 2022-04-05 DIAGNOSIS — E860 Dehydration: Secondary | ICD-10-CM | POA: Diagnosis present

## 2022-04-05 DIAGNOSIS — E785 Hyperlipidemia, unspecified: Secondary | ICD-10-CM | POA: Diagnosis present

## 2022-04-05 LAB — BASIC METABOLIC PANEL
Anion gap: 13 (ref 5–15)
BUN: 10 mg/dL (ref 8–23)
CO2: 23 mmol/L (ref 22–32)
Calcium: 9.9 mg/dL (ref 8.9–10.3)
Chloride: 101 mmol/L (ref 98–111)
Creatinine, Ser: 0.77 mg/dL (ref 0.61–1.24)
GFR, Estimated: >60 mL/min (ref >60)
Glucose, Bld: 108 mg/dL — ABNORMAL HIGH (ref 70–99)
Potassium: 3.5 mmol/L (ref 3.5–5.1)
Sodium: 137 mmol/L (ref 135–145)

## 2022-04-05 LAB — CBC
HCT: 38.6 % — ABNORMAL LOW (ref 39.0–52.0)
Hemoglobin: 14 g/dL (ref 13.0–17.0)
MCH: 37.4 pg — ABNORMAL HIGH (ref 26.0–34.0)
MCHC: 36.3 g/dL — ABNORMAL HIGH (ref 30.0–36.0)
MCV: 103.2 fL — ABNORMAL HIGH (ref 80.0–100.0)
Platelets: 152 10*3/uL (ref 150–400)
RBC: 3.74 MIL/uL — ABNORMAL LOW (ref 4.22–5.81)
RDW: 12.1 % (ref 11.5–15.5)
WBC: 4.9 10*3/uL (ref 4.0–10.5)
nRBC: 0 % (ref 0.0–0.2)

## 2022-04-05 LAB — URINALYSIS, ROUTINE W REFLEX MICROSCOPIC
Bacteria, UA: NONE SEEN
Bilirubin Urine: NEGATIVE
Glucose, UA: NEGATIVE mg/dL
Hgb urine dipstick: NEGATIVE
Ketones, ur: 20 mg/dL — AB
Leukocytes,Ua: NEGATIVE
Nitrite: NEGATIVE
Protein, ur: 30 mg/dL — AB
Specific Gravity, Urine: 1.017 (ref 1.005–1.030)
pH: 5 (ref 5.0–8.0)

## 2022-04-05 LAB — CBG MONITORING, ED
Glucose-Capillary: 109 mg/dL — ABNORMAL HIGH (ref 70–99)
Glucose-Capillary: 102 mg/dL — ABNORMAL HIGH (ref 70–99)

## 2022-04-05 MED ORDER — SODIUM CHLORIDE 0.9 % IV BOLUS
1000.0000 mL | Freq: Once | INTRAVENOUS | Status: AC
  Administered 2022-04-05: 1000 mL via INTRAVENOUS

## 2022-04-05 MED ORDER — ONDANSETRON HCL 4 MG/2ML IJ SOLN: 4.0000 mg | Freq: Four times a day (QID) | INTRAMUSCULAR | Status: DC | PRN

## 2022-04-05 MED ORDER — ONDANSETRON HCL 4 MG PO TABS: 4.0000 mg | ORAL_TABLET | Freq: Four times a day (QID) | ORAL | Status: DC | PRN

## 2022-04-05 MED ORDER — PNEUMOCOCCAL 20-VAL CONJ VACC 0.5 ML IM SUSY
0.5000 mL | PREFILLED_SYRINGE | INTRAMUSCULAR | Status: AC
  Administered 2022-04-06: 0.5 mL via INTRAMUSCULAR
  Filled 2022-04-05: qty 0.5

## 2022-04-05 MED ORDER — ENOXAPARIN SODIUM 40 MG/0.4ML IJ SOSY
40.0000 mg | PREFILLED_SYRINGE | INTRAMUSCULAR | Status: DC
  Administered 2022-04-05 – 2022-04-10 (×6): 40 mg via SUBCUTANEOUS
  Filled 2022-04-05 (×6): qty 0.4

## 2022-04-05 MED ORDER — ACETAMINOPHEN 650 MG RE SUPP: 650.0000 mg | Freq: Four times a day (QID) | RECTAL | Status: DC | PRN

## 2022-04-05 MED ORDER — SENNOSIDES-DOCUSATE SODIUM 8.6-50 MG PO TABS
1.0000 | ORAL_TABLET | Freq: Every day | ORAL | Status: DC
  Administered 2022-04-05 – 2022-04-11 (×6): 1 via ORAL
  Filled 2022-04-05 (×7): qty 1

## 2022-04-05 MED ORDER — ACETAMINOPHEN 325 MG PO TABS
650.0000 mg | ORAL_TABLET | Freq: Four times a day (QID) | ORAL | Status: DC | PRN
  Administered 2022-04-08 – 2022-04-10 (×5): 650 mg via ORAL
  Filled 2022-04-05 (×5): qty 2

## 2022-04-05 NOTE — ED Provider Triage Note (Signed)
Emergency Medicine Provider Triage Evaluation Note  Jeremy Hayden , a 66 y.o. male  was evaluated in triage.  Pt complains of syncope. Unwitnessed syncope at home last night.  No injury.  Has had one other similar episode a month ago.  No prodromal  Review of Systems  Positive: As above Negative: As above  Physical Exam  BP 139/89 (BP Location: Left Arm)   Pulse 92   Temp 97.8 F (36.6 C) (Oral)   Resp 16   Ht '5\' 4"'$  (1.626 m)   Wt 62.6 kg   SpO2 98%   BMI 23.69 kg/m  Gen:   Awake, no distress   Resp:  Normal effort  MSK:   Moves extremities without difficulty  Other:    Medical Decision Making  Medically screening exam initiated at 12:14 PM.  Appropriate orders placed.  Jeremy Hayden was informed that the remainder of the evaluation will be completed by another provider, this initial triage assessment does not replace that evaluation, and the importance of remaining in the ED until their evaluation is complete.     Jeremy Moras, PA-C 04/05/22 1216

## 2022-04-05 NOTE — ED Notes (Signed)
Patient transported to CT 

## 2022-04-05 NOTE — ED Notes (Signed)
Pt ambulatory to bathroom w/o assist 

## 2022-04-05 NOTE — ED Triage Notes (Signed)
Pt reports been out of work for about a month and has to get symptoms under control before can return. Reports had syncopal episode last night. Had episode about 2-3 weeks prior to that.  Has appt with neurology in [place] but not until October.

## 2022-04-05 NOTE — ED Provider Notes (Signed)
Presents for syncopal episode that occurred last night. Lost consciousness for about 15 minutes. Reports he did not hit his head.  History of syncopal episodes over the last few months, most recent 2-3 weeks ago.  History of afib, not anticoagulated.   Discharged to ED via POV, needing higher level of care.   Tyjay Galindo, Vernice Jefferson 04/05/22 1054

## 2022-04-05 NOTE — ED Notes (Signed)
Patient is being discharged from the Urgent Care and sent to the Emergency Department via POV with family. Per Les Pou, PA patient is in need of higher level of care due to syncopal episodes. Patient is aware and verbalizes understanding of plan of care.  Vitals:   04/05/22 1012  BP: 139/88  Pulse: 91  Resp: 15  Temp: 98.5 F (36.9 C)  SpO2: 100%

## 2022-04-05 NOTE — H&P (Signed)
History and Physical    Patient: Jeremy Hayden ZOX:[id number] DOB: [birth year] DOA: 04/05/2022 DOS: the patient was seen and examined on 04/05/2022 PCP: [facility]  Patient coming from: Home  Chief Complaint:  Chief Complaint  Patient presents with   Loss of Consciousness   HPI: Jeremy Hayden is a 66 y.o. male with medical history significant of hypertension, GERD, presents to ED, with recurrent episodes of syncope. As per the patient, he had been passing out since June this year. It happened about 5 times so far. The 4 th time he passed out, he was at a friend's house , coming out of the bathroom, felt dizzy, wobbly on the feet and passed out for a few minutes, was witnessed by his friend. The last episode was last night, he remembers being in the kitchen and passed out. He went to [facility] and was asked to come to [facility] ED.  He denies any chest pain, sob, nausea, vomiting, abdominal pain, rectal bleeding or urinary symptoms. He denies any tingling or numbness of the extremities. He denies any headache. He denies any prodromal symptoms. No fevers or chills.     On arrival to ED, he was afebrile, hypertensive,  Labs revealed unremarkable BMP.  EKG shows sinus rhythm with borderline T wave abnormalities.  CT head without contrast , does not show any acute abnn.   He was referred to [facility] for admission.  Review of Systems: As mentioned in the history of present illness. All other systems reviewed and are negative. Past Medical History:  Diagnosis Date   Cancer [facility])    prostate   GERD (gastroesophageal reflux disease)    Hypertension    Past Surgical History:  Procedure Laterality Date   ESOPHAGOGASTRODUODENOSCOPY N/A 03/27/2015   Procedure: ESOPHAGOGASTRODUODENOSCOPY (EGD);  Surgeon: Ruffin Frederick, MD;  Location: [facility] ENDOSCOPY;  Service: Gastroenterology;  Laterality: N/A;   NECK SURGERY  11/2001   multilevel c spine diskectomy, decompression, arthrodesis with  bone graft and plate placed.  Dr Sharolyn Douglas   PROSTATE SURGERY     ROTATOR CUFF REPAIR Right 2003   with partial acromiectomy/plasty   Social History:  reports that he has never smoked. He has never used smokeless tobacco. He reports current alcohol use of about 24.0 standard drinks of alcohol per week. He reports that he does not use drugs.  No Known Allergies  Family History  Problem Relation Age of Onset   Ovarian cancer Mother    Cancer - Colon Father    Prostate cancer Father    Brain cancer Sister    Sickle cell anemia Brother     Prior to Admission medications   Medication Sig Start Date End Date Taking? Authorizing Provider  Acetaminophen (STANBACK ASPIRIN FREE PO) Take by mouth.    [provider]  amLODipine (NORVASC) 5 MG tablet Take 5 mg by mouth daily.    [provider]  hydrocortisone (ANUSOL-HC) 2.5 % rectal cream Place 1 application rectally 2 (two) times daily. 08/20/21   Tomi Bamberger, PA-C  OMEPRAZOLE PO Take by mouth.    [provider]  ondansetron (ZOFRAN ODT) 4 MG disintegrating tablet Take 1 tablet (4 mg total) by mouth every 8 (eight) hours as needed for nausea or vomiting. 02/26/21   Lamptey, Britta Mccreedy, MD  polyethylene glycol (MIRALAX) 17 g packet Take 17 g by mouth daily. Dissolve one cap full in solution (water, gatorade, etc.) and administer once cap-full daily. You may titrate up  daily by 1 cap-full until the patient is having pudding consistency of stools. After the patient is able to start passing softer stools they will need to be on 1/2 cap-full daily for 2 weeks. 08/27/21   Couture, Cortni S, PA-C  senna-docusate (SENOKOT-S) 8.6-50 MG tablet Take 1 tablet by mouth daily. 06/19/20   Merrilee Jansky, MD    Physical Exam: Vitals:   04/05/22 1357 04/05/22 1551 04/05/22 1630 04/05/22 1830  BP: (!) 134/90 (!) 155/98 (!) 162/102 (!) 160/98  Pulse: 86 83 87 91  Resp: 16 18 18 18   Temp:  97.9 F (36.6 C)    TempSrc:  Oral     SpO2: 99% 100% 100% 100%  Weight:      Height:       General exam: Appears calm and comfortable  Respiratory system: Clear to auscultation. Respiratory effort normal. Cardiovascular system: S1 & S2 heard, RRR. No JVD,  No pedal edema. Gastrointestinal system: Abdomen is nondistended, soft and nontender. Normal bowel sounds heard. Central nervous system: Alert and oriented. No focal neurological deficits. Extremities: Symmetric 5 x 5 power. Skin: No rashes, lesions or ulcers Psychiatry: Judgement and insight appear normal. Mood & affect appropriate.   Data Reviewed: { Results for orders placed or performed during the hospital encounter of 04/05/22 (from the past 24 hour(s))  CBG monitoring, ED     Status: Abnormal   Collection Time: 04/05/22 12:01 PM  Result Value Ref Range   Glucose-Capillary 109 (H) 70 - 99 mg/dL   Comment 1 Notify RN   Basic metabolic panel     Status: Abnormal   Collection Time: 04/05/22 12:45 PM  Result Value Ref Range   Sodium 137 135 - 145 mmol/L   Potassium 3.5 3.5 - 5.1 mmol/L   Chloride 101 98 - 111 mmol/L   CO2 23 22 - 32 mmol/L   Glucose, Bld 108 (H) 70 - 99 mg/dL   BUN 10 8 - 23 mg/dL   Creatinine, Ser 9.60 0.61 - 1.24 mg/dL   Calcium 9.9 8.9 - 45.4 mg/dL   GFR, Estimated >09 >81 mL/min   Anion gap 13 5 - 15  CBC     Status: Abnormal   Collection Time: 04/05/22 12:45 PM  Result Value Ref Range   WBC 4.9 4.0 - 10.5 K/uL   RBC 3.74 (L) 4.22 - 5.81 MIL/uL   Hemoglobin 14.0 13.0 - 17.0 g/dL   HCT 19.1 (L) 47.8 - 29.5 %   MCV 103.2 (H) 80.0 - 100.0 fL   MCH 37.4 (H) 26.0 - 34.0 pg   MCHC 36.3 (H) 30.0 - 36.0 g/dL   RDW 62.1 30.8 - 65.7 %   Platelets 152 150 - 400 K/uL   nRBC 0.0 0.0 - 0.2 %  CBG monitoring, ED     Status: Abnormal   Collection Time: 04/05/22  3:40 PM  Result Value Ref Range   Glucose-Capillary 102 (H) 70 - 99 mg/dL  Urinalysis, Routine w reflex microscopic Urine, Clean Catch     Status: Abnormal   Collection Time: 04/05/22   3:53 PM  Result Value Ref Range   Color, Urine AMBER (A) YELLOW   APPearance CLEAR CLEAR   Specific Gravity, Urine 1.017 1.005 - 1.030   pH 5.0 5.0 - 8.0   Glucose, UA NEGATIVE NEGATIVE mg/dL   Hgb urine dipstick NEGATIVE NEGATIVE   Bilirubin Urine NEGATIVE NEGATIVE   Ketones, ur 20 (A) NEGATIVE mg/dL   Protein, ur 30 (A) NEGATIVE  mg/dL   Nitrite NEGATIVE NEGATIVE   Leukocytes,Ua NEGATIVE NEGATIVE   RBC / HPF 0-5 0 - 5 RBC/hpf   WBC, UA 0-5 0 - 5 WBC/hpf   Bacteria, UA NONE SEEN NONE SEEN   Squamous Epithelial / LPF 0-5 0 - 5   Mucus PRESENT    Hyaline Casts, UA PRESENT      Assessment and Plan:   Recurrent Syncope: Rule out arrhthymias, monitor on telemetry.  Rule out stroke , initial CT head is negative. Check MRI of the brain for evaluation of stroke.  Check orthostatic vital signs.  Check EEG to evaluate for seizures. Will request cardiology consult in am . Pt reports dizziness prior to the episode.     Hypertension:  BP parameters are slightly elevated. Resume home meds.  Check orthostatic vital signs.    GERD stable.      Advance Care Planning: full code   Consults: none.   Family Communication: none at bedside.   Severity of Illness: The appropriate patient status for this patient is OBSERVATION. Observation status is judged to be reasonable and necessary in order to provide the required intensity of service to ensure the patient's safety. The patient's presenting symptoms, physical exam findings, and initial radiographic and laboratory data in the context of their medical condition is felt to place them at decreased risk for further clinical deterioration. Furthermore, it is anticipated that the patient will be medically stable for discharge from the hospital within 2 midnights of admission.   Author: Kathlen Mody, MD 04/05/2022 7:44 PM  For on call review [email or web address].

## 2022-04-05 NOTE — ED Provider Notes (Signed)
[facility] DEPT Provider Note   CSN: [id number] Arrival date & time: 04/05/22  1106     History  Chief Complaint  Patient presents with   Loss of Consciousness    KELVYN SCHUNK is a 66 y.o. male.   Loss of Consciousness    Patient is a 66 year old male presenting with a chief complaint of loss of consciousness. He states he lost consciousness last night at 10:30pm. He states he was out for 15 minutes. Patient went to urgent care today and was sent to the ED. Patient states this has happened five times before with his first episode being in June of 2023. He does have a neurologist appointment scheduled in October, 2023. He reports he did hit his head and feels a knot on the back of his head. Patient also states he has a slight headache and feels a little dizzy. He has no other complaints at this time. There were no witnesses and patient does live alone. He denies n/v, vision changes, chest pain, numbness or tingling.   Home Medications Prior to Admission medications   Medication Sig Start Date End Date Taking? Authorizing Provider  Acetaminophen (STANBACK ASPIRIN FREE PO) Take by mouth.    [provider]  amLODipine (NORVASC) 5 MG tablet Take 5 mg by mouth daily.    [provider]  hydrocortisone (ANUSOL-HC) 2.5 % rectal cream Place 1 application rectally 2 (two) times daily. 08/20/21   Francene Finders, PA-C  OMEPRAZOLE PO Take by mouth.    [provider]  ondansetron (ZOFRAN ODT) 4 MG disintegrating tablet Take 1 tablet (4 mg total) by mouth every 8 (eight) hours as needed for nausea or vomiting. 02/26/21   Lamptey, Myrene Galas, MD  polyethylene glycol (MIRALAX) 17 g packet Take 17 g by mouth daily. Dissolve one cap full in solution (water, gatorade, etc.) and administer once cap-full daily. You may titrate up daily by 1 cap-full until the patient is having pudding consistency of stools. After the patient is able to start  passing softer stools they will need to be on 1/2 cap-full daily for 2 weeks. 08/27/21   Couture, Cortni S, PA-C  senna-docusate (SENOKOT-S) 8.6-50 MG tablet Take 1 tablet by mouth daily. 06/19/20   Lamptey, Myrene Galas, MD      Allergies    Patient has no known allergies.    Review of Systems   Review of Systems  Cardiovascular:  Positive for syncope.    Physical Exam Updated Vital Signs BP (!) 134/90 (BP Location: Left Arm)   Pulse 86   Temp 97.8 F (36.6 C) (Oral)   Resp 16   Ht '5\' 4"'$  (1.626 m)   Wt 62.6 kg   SpO2 99%   BMI 23.69 kg/m  Physical Exam Vitals and nursing note reviewed.  Constitutional:      General: He is not in acute distress.    Appearance: He is well-developed.  HENT:     Head: Normocephalic.     Comments: Small swelling noted to occiput of scalp with minimal tenderness Eyes:     Conjunctiva/sclera: Conjunctivae normal.  Cardiovascular:     Rate and Rhythm: Normal rate and regular rhythm.     Pulses: Normal pulses.     Heart sounds: Normal heart sounds.  Pulmonary:     Effort: Pulmonary effort is normal.     Breath sounds: Normal breath sounds.  Abdominal:     Palpations: Abdomen is soft.  Musculoskeletal:  Cervical back: Neck supple.  Skin:    Findings: No rash.  Neurological:     Mental Status: He is alert.     ED Results / Procedures / Treatments   Labs (all labs ordered are listed, but only abnormal results are displayed) Labs Reviewed  BASIC METABOLIC PANEL - Abnormal; Notable for the following components:      Result Value   Glucose, Bld 108 (*)    All other components within normal limits  CBC - Abnormal; Notable for the following components:   RBC 3.74 (*)    HCT 38.6 (*)    MCV 103.2 (*)    MCH 37.4 (*)    MCHC 36.3 (*)    All other components within normal limits  CBG MONITORING, ED - Abnormal; Notable for the following components:   Glucose-Capillary 109 (*)    All other components within normal limits  CBG MONITORING,  ED - Abnormal; Notable for the following components:   Glucose-Capillary 102 (*)    All other components within normal limits  URINALYSIS, ROUTINE W REFLEX MICROSCOPIC    EKG EKG Interpretation  Date/Time:  Friday April 05 2022 11:59:43 EDT Ventricular Rate:  88 PR Interval:  164 QRS Duration: 94 QT Interval:  377 QTC Calculation: 457 R Axis:   -43 Text Interpretation: Sinus rhythm Left axis deviation Borderline T abnormalities, anterior leads No significant change since last tracing Confirmed by Gareth [name] [phone]) on 04/05/2022 4:43:23 PM  Radiology CT Head Wo Contrast  Result Date: 04/05/2022 CLINICAL DATA:  Syncope/presyncope. EXAM: CT HEAD WITHOUT CONTRAST TECHNIQUE: Contiguous axial images were obtained from the base of the skull through the vertex without intravenous contrast. RADIATION DOSE REDUCTION: This exam was performed according to the departmental dose-optimization program which includes automated exposure control, adjustment of the mA and/or kV according to patient size and/or use of iterative reconstruction technique. COMPARISON:  CT examination dated October 28, 2021 FINDINGS: Brain: No evidence of acute infarction, hemorrhage, hydrocephalus, extra-axial collection or mass lesion/mass effect. Patchy areas of low-attenuation of the periventricular white matter presumed chronic microvascular ischemic changes. Vascular: No hyperdense vessel or unexpected calcification. Skull: Normal. Negative for fracture or focal lesion. Sinuses/Orbits: No acute finding. Other: None. IMPRESSION: No acute intracranial abnormality. Electronically Signed   By: Keane Police D.O.   On: 04/05/2022 16:22    Procedures Procedures    Medications Ordered in ED Medications - No data to display  ED Course/ Medical Decision Making/ A&P                           Medical Decision Making Amount and/or Complexity of Data Reviewed Labs: ordered. Radiology: ordered.  Risk Decision regarding  hospitalization.   BP (!) 134/90 (BP Location: Left Arm)   Pulse 86   Temp 97.8 F (36.6 C) (Oral)   Resp 16   Ht '5\' 4"'$  (1.626 m)   Wt 62.6 kg   SpO2 99%   BMI 23.69 kg/m   38:54 PM 66 year old male significant history of paroxysmal atrial fibrillation not on any anticoagulant, hypertension, GERD, no history of prostate cancer sent here from urgent care center for evaluation of syncope.  Patient states last night while at home he had a syncopal episode.  He found himself on the ground but denies hitting his head.  It was an unwitnessed episode.  Does not endorse any precipitating symptoms prior to his syncope.  Denies any active pain.  States he had 4  similar episode like this for the past several months with the last episode several weeks ago without any provocation.  Does have an appointment to be seen by neurologist next month but was concerned about his symptoms prompting this ER visit.  Denies any headache neck pain chest pain trouble breathing abdominal pain back pain dysuria focal numbness or focal weakness or confusion.  No history of seizure.  5:09 PM Labs, EKG, and imaging obtained independently viewed interpreted by me and I agree with the radiologist interpretation.  Labs are reassuring, no significant electrolyte imbalance, normal renal function, normal CBG, normal WBC, normal H&H, head CT scan without any acute intracranial abnormality.  EKG without concerning arrhythmia or ischemic changes.  Urinalysis is currently pending.  Vital signs stable.  Given that patient has had 5 syncopal episodes without any prodrome.  I have consulted hospitalist for admission.  Patient agrees to plan.  IV fluid given.  This patient presents to the ED for concern of syncope, this involves an extensive number of treatment options, and is a complaint that carries with it a high risk of complications and morbidity.  The differential diagnosis includes orthostatic syncope, cardiac arrhythmia,  hypoglycemia, electrolytes derangement, stroke, acs  Co morbidities that complicate the patient evaluation recurrent syncope  Paroxysmal afib Additional history obtained:  Additional history obtained from pt External records from outside source obtained and reviewed including EMR including prior labs and imaging  Lab Tests:  I Ordered, and personally interpreted labs.  The pertinent results include:  as above  Imaging Studies ordered:  I ordered imaging studies including head CT I independently visualized and interpreted imaging which showed no acute changes I agree with the radiologist interpretation  Cardiac Monitoring:  The patient was maintained on a cardiac monitor.  I personally viewed and interpreted the cardiac monitored which showed an underlying rhythm of: NSR  Medicines ordered and prescription drug management:  I ordered medication including IVF  for syncope Reevaluation of the patient after these medicines showed that the patient stayed the same I have reviewed the patients home medicines and have made adjustments as needed  Test Considered: as above  Critical Interventions: IVF  Consultations Obtained:  I requested consultation with the hospitalist Dr. Darci Current,  and discussed lab and imaging findings as well as pertinent plan - they recommend: admission  Problem List / ED Course: syncope  Reevaluation:  After the interventions noted above, I reevaluated the patient and found that they have :improved  Social Determinants of Health: none  Dispostion:  After consideration of the diagnostic results and the patients response to treatment, I feel that the patent would benefit from admission.         Final Clinical Impression(s) / ED Diagnoses Final diagnoses:  Syncope and collapse    Rx / DC Orders ED Discharge Orders     None         [name], PA-C 04/05/22 1710    Audley Hose, MD 04/05/22 2231

## 2022-04-05 NOTE — ED Triage Notes (Signed)
Patient reports that he had a syncopal episode last night. Patient denies hitting his head. Patient went to UC prior to arrival to the ED.  Patient also reports that he had another syncopal episode in August. Patient has an appointment with a neurologist on 05/07/22.

## 2022-04-06 ENCOUNTER — Observation Stay (HOSPITAL_COMMUNITY): Payer: No Typology Code available for payment source

## 2022-04-06 DIAGNOSIS — I10 Essential (primary) hypertension: Secondary | ICD-10-CM

## 2022-04-06 DIAGNOSIS — Z808 Family history of malignant neoplasm of other organs or systems: Secondary | ICD-10-CM | POA: Diagnosis not present

## 2022-04-06 DIAGNOSIS — I071 Rheumatic tricuspid insufficiency: Secondary | ICD-10-CM | POA: Diagnosis not present

## 2022-04-06 DIAGNOSIS — I480 Paroxysmal atrial fibrillation: Secondary | ICD-10-CM

## 2022-04-06 DIAGNOSIS — E876 Hypokalemia: Secondary | ICD-10-CM | POA: Diagnosis not present

## 2022-04-06 DIAGNOSIS — E860 Dehydration: Secondary | ICD-10-CM | POA: Diagnosis not present

## 2022-04-06 DIAGNOSIS — R55 Syncope and collapse: Secondary | ICD-10-CM | POA: Diagnosis present

## 2022-04-06 DIAGNOSIS — Z832 Family history of diseases of the blood and blood-forming organs and certain disorders involving the immune mechanism: Secondary | ICD-10-CM | POA: Diagnosis not present

## 2022-04-06 DIAGNOSIS — E785 Hyperlipidemia, unspecified: Secondary | ICD-10-CM | POA: Diagnosis not present

## 2022-04-06 DIAGNOSIS — G909 Disorder of the autonomic nervous system, unspecified: Secondary | ICD-10-CM | POA: Diagnosis not present

## 2022-04-06 DIAGNOSIS — Z8042 Family history of malignant neoplasm of prostate: Secondary | ICD-10-CM | POA: Diagnosis not present

## 2022-04-06 DIAGNOSIS — Z79899 Other long term (current) drug therapy: Secondary | ICD-10-CM | POA: Diagnosis not present

## 2022-04-06 DIAGNOSIS — K219 Gastro-esophageal reflux disease without esophagitis: Secondary | ICD-10-CM | POA: Diagnosis not present

## 2022-04-06 DIAGNOSIS — I493 Ventricular premature depolarization: Secondary | ICD-10-CM | POA: Diagnosis not present

## 2022-04-06 DIAGNOSIS — I951 Orthostatic hypotension: Secondary | ICD-10-CM | POA: Diagnosis not present

## 2022-04-06 DIAGNOSIS — Z23 Encounter for immunization: Secondary | ICD-10-CM | POA: Diagnosis not present

## 2022-04-06 DIAGNOSIS — Z8041 Family history of malignant neoplasm of ovary: Secondary | ICD-10-CM | POA: Diagnosis not present

## 2022-04-06 DIAGNOSIS — Z8546 Personal history of malignant neoplasm of prostate: Secondary | ICD-10-CM | POA: Diagnosis not present

## 2022-04-06 LAB — ECHOCARDIOGRAM COMPLETE
AR max vel: 2.56 cm2
AV Area VTI: 2.42 cm2
AV Area mean vel: 2.42 cm2
AV Mean grad: 2 mmHg
AV Peak grad: 4.4 mmHg
Ao pk vel: 1.05 m/s
Area-P 1/2: 3.72 cm2
Calc EF: 55.8 %
Height: 64 in
MV M vel: 3.05 m/s
MV Peak grad: 37.2 mmHg
S' Lateral: 2.9 cm
Single Plane A2C EF: 53.9 %
Single Plane A4C EF: 58.7 %
Weight: 2208 oz

## 2022-04-06 LAB — BASIC METABOLIC PANEL
Anion gap: 9 (ref 5–15)
BUN: 8 mg/dL (ref 8–23)
CO2: 27 mmol/L (ref 22–32)
Calcium: 9.6 mg/dL (ref 8.9–10.3)
Chloride: 103 mmol/L (ref 98–111)
Creatinine, Ser: 0.66 mg/dL (ref 0.61–1.24)
GFR, Estimated: >60 mL/min (ref >60)
Glucose, Bld: 100 mg/dL — ABNORMAL HIGH (ref 70–99)
Potassium: 3.5 mmol/L (ref 3.5–5.1)
Sodium: 139 mmol/L (ref 135–145)

## 2022-04-06 LAB — CBC WITH DIFFERENTIAL/PLATELET
Abs Immature Granulocytes: 0.01 10*3/uL (ref 0.00–0.07)
Basophils Absolute: 0 10*3/uL (ref 0.0–0.1)
Basophils Relative: 1 %
Eosinophils Absolute: 0 10*3/uL (ref 0.0–0.5)
Eosinophils Relative: 1 %
HCT: 37.4 % — ABNORMAL LOW (ref 39.0–52.0)
Hemoglobin: 13 g/dL (ref 13.0–17.0)
Immature Granulocytes: 0 %
Lymphocytes Relative: 15 %
Lymphs Abs: 0.6 10*3/uL — ABNORMAL LOW (ref 0.7–4.0)
MCH: 36.8 pg — ABNORMAL HIGH (ref 26.0–34.0)
MCHC: 34.8 g/dL (ref 30.0–36.0)
MCV: 105.9 fL — ABNORMAL HIGH (ref 80.0–100.0)
Monocytes Absolute: 0.6 10*3/uL (ref 0.1–1.0)
Monocytes Relative: 16 %
Neutro Abs: 2.6 10*3/uL (ref 1.7–7.7)
Neutrophils Relative %: 67 %
Platelets: 153 10*3/uL (ref 150–400)
RBC: 3.53 MIL/uL — ABNORMAL LOW (ref 4.22–5.81)
RDW: 12.3 % (ref 11.5–15.5)
WBC: 3.8 10*3/uL — ABNORMAL LOW (ref 4.0–10.5)
nRBC: 0 % (ref 0.0–0.2)

## 2022-04-06 LAB — T4, FREE: Free T4: 1.15 ng/dL — ABNORMAL HIGH (ref 0.61–1.12)

## 2022-04-06 LAB — TSH: TSH: 4.565 u[IU]/mL — ABNORMAL HIGH (ref 0.350–4.500)

## 2022-04-06 LAB — VITAMIN B12: Vitamin B-12: 266 pg/mL (ref 180–914)

## 2022-04-06 LAB — HIV ANTIBODY (ROUTINE TESTING W REFLEX): HIV Screen 4th Generation wRfx: NONREACTIVE

## 2022-04-06 MED ORDER — LACTATED RINGERS IV BOLUS
1000.0000 mL | Freq: Once | INTRAVENOUS | Status: AC
  Administered 2022-04-06: 1000 mL via INTRAVENOUS

## 2022-04-06 MED ORDER — LACTATED RINGERS IV SOLN: INTRAVENOUS | Status: DC

## 2022-04-06 MED ORDER — PANTOPRAZOLE SODIUM 40 MG PO TBEC
40.0000 mg | DELAYED_RELEASE_TABLET | Freq: Every day | ORAL | Status: DC
  Administered 2022-04-06 – 2022-04-11 (×6): 40 mg via ORAL
  Filled 2022-04-06 (×6): qty 1

## 2022-04-06 NOTE — Progress Notes (Signed)
  Echocardiogram 2D Echocardiogram has been performed.  Jeremy Hayden 04/06/2022, 11:28 AM

## 2022-04-06 NOTE — Evaluation (Signed)
Physical Therapy Evaluation Patient Details Name: Jeremy Hayden MRN: [id number] DOB: 24-Aug-1955 Today's Date: 04/06/2022  History of Present Illness  Pt is a 66yo male presenting to [facility] ED from urgent care on 04/05/22 with recurrent syncope. CT & MRI negative for acute abnormality.  PMH: hx of prostate cancer, GERD, HTN, R-rotator cuff repair,   Clinical Impression  Patient evaluated by Physical Therapy with no further acute PT needs identified. All education has been completed and the patient has no further questions. Pt demonstrated modified independence with bed mobility and transfers. Orthostatic vitals taken and were positive, see table below, pt denied any symptoms. Pt ambulated 472ft without AD or overt LOB with close chair follow and near-constant monitoring of symptoms and vitals, VS within normal limits and pt continues to deny symptoms. Pt completed Dynamic Gait Index and scored a 19/24 indicating pt is at risk for additional falls. Provided home environment fall reduction education, including use of AD, pt verbalized understanding. See below for any follow-up Physical Therapy or equipment needs. PT is signing off. Thank you for this referral.     04/06/22 1258  Orthostatic Lying   BP- Lying 123/87  Pulse- Lying 84  Orthostatic Sitting  BP- Sitting 116/90  Pulse- Sitting 86  Orthostatic Standing at 0 minutes  BP- Standing at 0 minutes 93/72  Pulse- Standing at 0 minutes 87  Orthostatic Standing at 3 minutes  BP- Standing at 3 minutes 98/71  Pulse- Standing at 3 minutes 93       Recommendations for follow up therapy are one component of a multi-disciplinary discharge planning process, led by the attending physician.  Recommendations may be updated based on patient status, additional functional criteria and insurance authorization.  Follow Up Recommendations Outpatient PT      Assistance Recommended at Discharge PRN  Patient can return home with the following  A little help  with walking and/or transfers    Equipment Recommendations Cane  Recommendations for Other Services       Functional Status Assessment Patient has not had a recent decline in their functional status     Precautions / Restrictions Precautions Precautions: Fall Restrictions Weight Bearing Restrictions: No Other Position/Activity Restrictions: wbat      Mobility  Bed Mobility Overal bed mobility: Modified Independent             General bed mobility comments: Increased time    Transfers Overall transfer level: Modified independent Equipment used: None               General transfer comment: Increased time. OH vitals taken, seen flowsheet.    Ambulation/Gait Ambulation/Gait assistance: Min guard Gait Distance (Feet): 400 Feet Assistive device: None Gait Pattern/deviations: Step-through pattern, Narrow base of support Gait velocity: decreased     General Gait Details: Pt ambulated 458ft with min guard, no overt LOB noted or physical assist required, HR98-110. Standing rest break after 200 ft. No armswing despite cuing.  Stairs            Wheelchair Mobility    Modified Rankin (Stroke Patients Only)       Balance Overall balance assessment: Needs assistance Sitting-balance support: Feet supported, No upper extremity supported Sitting balance-Leahy Scale: Good     Standing balance support: No upper extremity supported, During functional activity Standing balance-Leahy Scale: Good                   Standardized Balance Assessment Standardized Balance Assessment : Dynamic Gait Index  Dynamic Gait Index Level Surface: Mild Impairment Change in Gait Speed: Mild Impairment Gait with Horizontal Head Turns: Normal Gait with Vertical Head Turns: Normal Gait and Pivot Turn: Normal Step Over Obstacle: Mild Impairment Step Around Obstacles: Mild Impairment Steps: Mild Impairment Total Score: 19       Pertinent Vitals/Pain Pain  Assessment Pain Assessment: No/denies pain    Home Living Family/patient expects to be discharged to:: Private residence Living Arrangements: Alone Available Help at Discharge: Family;Friend(s);Available 24 hours/day Type of Home: Apartment Home Access: Stairs to enter Entrance Stairs-Rails: Right;Left;Can reach both Entrance Stairs-Number of Steps: 15   Home Layout: One level Home Equipment: None      Prior Function Prior Level of Function : Independent/Modified Independent;History of Falls (last six months);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 denies dizzy/lightheaded/double vision. ADLs Comments: IND     Hand Dominance        Extremity/Trunk Assessment   Upper Extremity Assessment Upper Extremity Assessment: Overall WFL for tasks assessed    Lower Extremity Assessment Lower Extremity Assessment: RLE deficits/detail;LLE deficits/detail RLE Deficits / Details: MMT grossly 5/5 hip knee ankle, functional ROM RLE Sensation: WNL LLE Deficits / Details: MMT grossly 5/5 hip knee ankle, functional ROM LLE Sensation: WNL    Cervical / Trunk Assessment Cervical / Trunk Assessment: Normal  Communication   Communication: No difficulties  Cognition Arousal/Alertness: Awake/alert Behavior During Therapy: WFL for tasks assessed/performed Overall Cognitive Status: Within Functional Limits for tasks assessed                                          General Comments      Exercises     Assessment/Plan    PT Assessment All further PT needs can be met in the next venue of care  PT Problem List Decreased activity tolerance;Decreased balance;Decreased mobility       PT Treatment Interventions      PT Goals (Current goals can be found in the Care Plan section)  Acute Rehab PT Goals Patient Stated Goal: to stop falling PT Goal Formulation: All assessment and education  complete, DC therapy    Frequency       Co-evaluation               AM-PAC PT "6 Clicks" Mobility  Outcome Measure Help needed turning from your back to your side while in a flat bed without using bedrails?: None Help needed moving from lying on your back to sitting on the side of a flat bed without using bedrails?: None Help needed moving to and from a bed to a chair (including a wheelchair)?: A Little Help needed standing up from a chair using your arms (e.g., wheelchair or bedside chair)?: A Little Help needed to walk in hospital room?: A Little Help needed climbing 3-5 steps with a railing? : A Little 6 Click Score: 20    End of Session   Activity Tolerance: Patient tolerated treatment well;No increased pain Patient left: in chair;with call bell/phone within reach Nurse Communication: Mobility status PT Visit Diagnosis: Difficulty in walking, not elsewhere classified (R26.2);History of falling (Z91.81)    Time: 5366-4403 PT Time Calculation (min) (ACUTE ONLY): 26 min   Charges:   PT Evaluation $PT Eval Low Complexity: 1 Low PT Treatments $Gait  Training: 8-22 mins        Jamesetta Geralds, PT, DPT [facility] Rehabilitation Department Office: [phone]  Jamesetta Geralds 04/06/2022, 1:32 PM

## 2022-04-06 NOTE — Progress Notes (Signed)
PROGRESS NOTE    Jeremy Hayden  ZOX:[id number] DOB: 06-21-1956 DOA: 04/05/2022 PCP: [facility]    Chief Complaint  Patient presents with   Loss of Consciousness    Brief Narrative:    Jeremy Hayden is a 66 y.o. male with medical history significant of hypertension, GERD, presents to ED, with recurrent episodes of syncope. As per the patient, he had been passing out since June this year. It happened about 5 times so far. The 4 th time he passed out, he was at a friend's house , coming out of the bathroom, felt dizzy, wobbly on the feet and passed out for a few minutes, was witnessed by his friend. The last episode  happened the night before admission, , he remembers being in the kitchen and passed out. He went to [facility] and was asked to come to [facility] ED.   EKG shows sinus rhythm with borderline T wave abnormalities.  CT head without contrast , does not show any acute abnn.    He was referred to [facility] for admission.   Assessment & Plan:   Principal Problem:   Syncope Active Problems:   Hyperlipidemia   Hypertension   GERD   Dehydration   Paroxysmal atrial fibrillation (HCC)   Syncope: Suspect orthostatic hypotension.  Since his orthostatic vital signs are positive and he was symptomatic this morning.  Echocardiogram showed preserved LVef  and grade 1 diastolic dysfunction  CT head and MRI brain without contrast is negative for acute stroke.  EEG ordered and pending.  Carotid duplex ordered and pending.  Overnight telemetry shows multiple pVC'S. Patient also has a h/o PAF, not on anti coagulation.    DVT prophylaxis: lovenox.  Code Status: (Full code) Family Communication: none at bedside.  Disposition:   Status is: Observation The patient will require care spanning > 2 midnights and should be moved to inpatient because: further work up for syncope.    Level of care: Telemetry Consultants:  None.   Procedures: none.   Antimicrobials: none.     Subjective: Dizziness this morning.   Objective: Vitals:   04/06/22 1004 04/06/22 1006 04/06/22 1008 04/06/22 1250  BP: 129/88 115/84 (!) 84/65 112/81  Pulse: 89 89 92 83  Resp: 18 20 18 18   Temp: 98.2 F (36.8 C) 98 F (36.7 C) 98.7 F (37.1 C) 97.9 F (36.6 C)  TempSrc: Oral Oral Oral Oral  SpO2: 99% 100% 99% 98%  Weight:      Height:        Intake/Output Summary (Last 24 hours) at 04/06/2022 1331 Last data filed at 04/06/2022 0815 Gross per 24 hour  Intake 240 ml  Output --  Net 240 ml   Filed Weights   04/05/22 1152  Weight: 62.6 kg    Examination:  General exam: Appears calm and comfortable  Respiratory system: Clear to auscultation. Respiratory effort normal. Cardiovascular system: S1 & S2 heard, RRR. No JVD,  No pedal edema. Gastrointestinal system: Abdomen is nondistended, soft and nontender. Normal bowel sounds heard. Central nervous system: Alert and oriented. No focal neurological deficits. Extremities: Symmetric 5 x 5 power. Skin: No rashes, lesions or ulcers Psychiatry:  Mood & affect appropriate.     Data Reviewed: I have personally reviewed following labs and imaging studies  CBC: Recent Labs  Lab 04/05/22 1245 04/06/22 0541  WBC 4.9 3.8*  NEUTROABS  --  2.6  HGB 14.0 13.0  HCT 38.6* 37.4*  MCV 103.2* 105.9*  PLT 152  153    Basic Metabolic Panel: Recent Labs  Lab 04/05/22 1245 04/06/22 0541  NA 137 139  K 3.5 3.5  CL 101 103  CO2 23 27  GLUCOSE 108* 100*  BUN 10 8  CREATININE 0.77 0.66  CALCIUM 9.9 9.6    GFR: Estimated Creatinine Clearance: 75 mL/min (by C-G formula based on SCr of 0.66 mg/dL).  Liver Function Tests: No results for input(s): "AST", "ALT", "ALKPHOS", "BILITOT", "PROT", "ALBUMIN" in the last 168 hours.  CBG: Recent Labs  Lab 04/05/22 1201 04/05/22 1540  GLUCAP 109* 102*     No results found for this or any previous visit (from the past 240 hour(s)).       Radiology  Studies: ECHOCARDIOGRAM COMPLETE  Result Date: 04/06/2022    ECHOCARDIOGRAM REPORT   Patient Name:   Jeremy Hayden Date of Exam: 04/06/2022 Medical Rec #:  [id number]       Height:       64.0 in Accession #:    [id number]      Weight:       138.0 lb Date of Birth:  [birth year]        BSA:          1.671 m Patient Age:    66 years        BP:           138/87 mmHg Patient Gender: M               HR:           83 bpm. Exam Location:  Inpatient Procedure: 2D Echo Indications:    syncope  History:        Patient has prior history of Echocardiogram examinations, most                 recent 03/27/2015. Arrythmias:Atrial Fibrillation,                 Signs/Symptoms:Chest Pain and Syncope; Risk Factors:Hypertension                 and Dyslipidemia.  Sonographer:    Cathie Hoops Referring Phys: Herminio Heads IMPRESSIONS  1. Left ventricular ejection fraction, by estimation, is 60 to 65%. The left ventricle has normal function. The left ventricle has no regional wall motion abnormalities. Left ventricular diastolic parameters are consistent with Grade I diastolic dysfunction (impaired relaxation).  2. Right ventricular systolic function is normal. The right ventricular size is normal. There is normal pulmonary artery systolic pressure.  3. The mitral valve is normal in structure. Trivial mitral valve regurgitation. No evidence of mitral stenosis.  4. The aortic valve is tricuspid. Aortic valve regurgitation is not visualized. No aortic stenosis is present.  5. The inferior vena cava is normal in size with greater than 50% respiratory variability, suggesting right atrial pressure of 3 mmHg. FINDINGS  Left Ventricle: Left ventricular ejection fraction, by estimation, is 60 to 65%. The left ventricle has normal function. The left ventricle has no regional wall motion abnormalities. The left ventricular internal cavity size was normal in size. There is  no left ventricular hypertrophy. Left ventricular diastolic parameters are  consistent with Grade I diastolic dysfunction (impaired relaxation). Right Ventricle: The right ventricular size is normal. No increase in right ventricular wall thickness. Right ventricular systolic function is normal. There is normal pulmonary artery systolic pressure. The tricuspid regurgitant velocity is 2.36 m/s, and  with an assumed right atrial pressure of 3 mmHg, the estimated  right ventricular systolic pressure is 25.3 mmHg. Left Atrium: Left atrial size was normal in size. Right Atrium: Right atrial size was normal in size. Pericardium: There is no evidence of pericardial effusion. Mitral Valve: The mitral valve is normal in structure. Trivial mitral valve regurgitation. No evidence of mitral valve stenosis. Tricuspid Valve: The tricuspid valve is normal in structure. Tricuspid valve regurgitation is mild . No evidence of tricuspid stenosis. Aortic Valve: The aortic valve is tricuspid. Aortic valve regurgitation is not visualized. No aortic stenosis is present. Aortic valve mean gradient measures 2.0 mmHg. Aortic valve peak gradient measures 4.4 mmHg. Aortic valve area, by VTI measures 2.42 cm. Pulmonic Valve: The pulmonic valve was normal in structure. Pulmonic valve regurgitation is not visualized. No evidence of pulmonic stenosis. Aorta: The aortic root is normal in size and structure. Venous: The inferior vena cava is normal in size with greater than 50% respiratory variability, suggesting right atrial pressure of 3 mmHg. IAS/Shunts: No atrial level shunt detected by color flow Doppler.  LEFT VENTRICLE PLAX 2D LVIDd:         4.50 cm     Diastology LVIDs:         2.90 cm     LV e' medial:    5.55 cm/s LV PW:         0.90 cm     LV E/e' medial:  8.8 LV IVS:        0.80 cm     LV e' lateral:   6.53 cm/s LVOT diam:     2.00 cm     LV E/e' lateral: 7.5 LV SV:         42 LV SV Index:   25 LVOT Area:     3.14 cm  LV Volumes (MOD) LV vol d, MOD A2C: 46.4 ml LV vol d, MOD A4C: 63.7 ml LV vol s, MOD A2C: 21.4  ml LV vol s, MOD A4C: 26.3 ml LV SV MOD A2C:     25.0 ml LV SV MOD A4C:     63.7 ml LV SV MOD BP:      30.3 ml RIGHT VENTRICLE RV Basal diam:  3.20 cm RV Mid diam:    3.10 cm RV S prime:     10.00 cm/s TAPSE (M-mode): 1.4 cm LEFT ATRIUM           Index        RIGHT ATRIUM          Index LA diam:      2.90 cm 1.74 cm/m   RA Area:     5.53 cm LA Vol (A4C): 27.0 ml 16.16 ml/m  RA Volume:   6.48 ml  3.88 ml/m  AORTIC VALVE                    PULMONIC VALVE AV Area (Vmax):    2.56 cm     PV Vmax:       1.03 m/s AV Area (Vmean):   2.42 cm     PV Peak grad:  4.2 mmHg AV Area (VTI):     2.42 cm AV Vmax:           105.00 cm/s AV Vmean:          71.700 cm/s AV VTI:            0.173 m AV Peak Grad:      4.4 mmHg AV Mean Grad:      2.0 mmHg LVOT  Vmax:         85.70 cm/s LVOT Vmean:        55.300 cm/s LVOT VTI:          0.133 m LVOT/AV VTI ratio: 0.77  AORTA Ao Root diam: 3.60 cm Ao Asc diam:  3.70 cm MITRAL VALVE               TRICUSPID VALVE MV Area (PHT): 3.72 cm    TR Peak grad:   22.3 mmHg MV Decel Time: 204 msec    TR Vmax:        236.00 cm/s MR Peak grad: 37.2 mmHg MR Vmax:      305.00 cm/s  SHUNTS MV E velocity: 48.80 cm/s  Systemic VTI:  0.13 m MV A velocity: 78.00 cm/s  Systemic Diam: 2.00 cm MV E/A ratio:  0.63 Charlton Haws MD Electronically signed by Charlton Haws MD Signature Date/Time: 04/06/2022/11:58:31 AM    Final    MR BRAIN WO CONTRAST  Result Date: 04/05/2022 CLINICAL DATA:  Initial evaluation for syncope/presyncope, CVA suspected. EXAM: MRI HEAD WITHOUT CONTRAST TECHNIQUE: Multiplanar, multiecho pulse sequences of the brain and surrounding structures were obtained without intravenous contrast. COMPARISON:  CT from earlier the same day. FINDINGS: Brain: Generalized age-related cerebral atrophy. Patchy and confluent T2/FLAIR hyperintensity involving the periventricular, deep, and subcortical white matter both cerebral hemispheres, most likely related chronic microvascular ischemic disease. No  abnormal foci of restricted diffusion to suggest acute or subacute ischemia. Gray-white matter differentiation well maintained. No encephalomalacia to suggest chronic cortical infarction or other insult. No acute intracranial hemorrhage. Few punctate chronic micro hemorrhages noted at the posterior left parieto-occipital region, likely hypertensive in nature. No mass lesion, midline shift or mass effect. Ventricles normal in size and morphology without hydrocephalus. No extra-axial fluid collection. Pituitary gland and suprasellar region within normal limits. Vascular: Major intracranial vascular flow voids are maintained. Skull and upper cervical spine: Craniocervical junction within normal limits. Postsurgical changes partially visualize within the upper cervical spine. Bone marrow signal intensity within normal limits. No scalp soft tissue abnormality. Sinuses/Orbits: Globes and orbital soft tissues are within normal limits. Paranasal sinuses are largely clear. No significant mastoid effusion. Other: None. IMPRESSION: 1. No acute intracranial abnormality. 2. Age-related cerebral atrophy with chronic microvascular ischemic disease. Electronically Signed   By: Rise Mu M.D.   On: 04/05/2022 21:57   CT Head Wo Contrast  Result Date: 04/05/2022 CLINICAL DATA:  Syncope/presyncope. EXAM: CT HEAD WITHOUT CONTRAST TECHNIQUE: Contiguous axial images were obtained from the base of the skull through the vertex without intravenous contrast. RADIATION DOSE REDUCTION: This exam was performed according to the departmental dose-optimization program which includes automated exposure control, adjustment of the mA and/or kV according to patient size and/or use of iterative reconstruction technique. COMPARISON:  CT examination dated October 28, 2021 FINDINGS: Brain: No evidence of acute infarction, hemorrhage, hydrocephalus, extra-axial collection or mass lesion/mass effect. Patchy areas of low-attenuation of the  periventricular white matter presumed chronic microvascular ischemic changes. Vascular: No hyperdense vessel or unexpected calcification. Skull: Normal. Negative for fracture or focal lesion. Sinuses/Orbits: No acute finding. Other: None. IMPRESSION: No acute intracranial abnormality. Electronically Signed   By: Larose Hires D.O.   On: 04/05/2022 16:22        Scheduled Meds:  enoxaparin (LOVENOX) injection  40 mg Subcutaneous Q24H   pantoprazole  40 mg Oral Q0600   senna-docusate  1 tablet Oral Daily   Continuous Infusions:  lactated ringers  lactated ringers       LOS: 0 days        Kathlen Mody, MD [facility]   To contact the attending provider between 7A-7P or the covering provider during after hours 7P-7A, please log into the web site [email or web address] and access using universal [place] password for that web site. If you do not have the password, please call the hospital operator.  04/06/2022, 1:31 PM

## 2022-04-06 NOTE — Plan of Care (Signed)

## 2022-04-07 DIAGNOSIS — I071 Rheumatic tricuspid insufficiency: Secondary | ICD-10-CM | POA: Diagnosis present

## 2022-04-07 DIAGNOSIS — Z79899 Other long term (current) drug therapy: Secondary | ICD-10-CM | POA: Diagnosis not present

## 2022-04-07 DIAGNOSIS — Z808 Family history of malignant neoplasm of other organs or systems: Secondary | ICD-10-CM | POA: Diagnosis not present

## 2022-04-07 DIAGNOSIS — I951 Orthostatic hypotension: Secondary | ICD-10-CM | POA: Diagnosis present

## 2022-04-07 DIAGNOSIS — R55 Syncope and collapse: Secondary | ICD-10-CM | POA: Diagnosis present

## 2022-04-07 DIAGNOSIS — K219 Gastro-esophageal reflux disease without esophagitis: Secondary | ICD-10-CM | POA: Diagnosis present

## 2022-04-07 DIAGNOSIS — I10 Essential (primary) hypertension: Secondary | ICD-10-CM | POA: Diagnosis present

## 2022-04-07 DIAGNOSIS — I493 Ventricular premature depolarization: Secondary | ICD-10-CM | POA: Diagnosis present

## 2022-04-07 DIAGNOSIS — Z8546 Personal history of malignant neoplasm of prostate: Secondary | ICD-10-CM | POA: Diagnosis not present

## 2022-04-07 DIAGNOSIS — E785 Hyperlipidemia, unspecified: Secondary | ICD-10-CM | POA: Diagnosis present

## 2022-04-07 DIAGNOSIS — Z8041 Family history of malignant neoplasm of ovary: Secondary | ICD-10-CM | POA: Diagnosis not present

## 2022-04-07 DIAGNOSIS — Z8042 Family history of malignant neoplasm of prostate: Secondary | ICD-10-CM | POA: Diagnosis not present

## 2022-04-07 DIAGNOSIS — I480 Paroxysmal atrial fibrillation: Secondary | ICD-10-CM | POA: Diagnosis present

## 2022-04-07 DIAGNOSIS — E876 Hypokalemia: Secondary | ICD-10-CM | POA: Diagnosis present

## 2022-04-07 DIAGNOSIS — Z832 Family history of diseases of the blood and blood-forming organs and certain disorders involving the immune mechanism: Secondary | ICD-10-CM | POA: Diagnosis not present

## 2022-04-07 DIAGNOSIS — Z23 Encounter for immunization: Secondary | ICD-10-CM | POA: Diagnosis not present

## 2022-04-07 DIAGNOSIS — E860 Dehydration: Secondary | ICD-10-CM | POA: Diagnosis present

## 2022-04-07 DIAGNOSIS — G909 Disorder of the autonomic nervous system, unspecified: Secondary | ICD-10-CM

## 2022-04-07 MED ORDER — MIDODRINE HCL 5 MG PO TABS
2.5000 mg | ORAL_TABLET | Freq: Three times a day (TID) | ORAL | Status: DC
  Administered 2022-04-07 – 2022-04-08 (×3): 2.5 mg via ORAL
  Filled 2022-04-07 (×3): qty 1

## 2022-04-07 NOTE — Progress Notes (Signed)
Cardiology follow up arranged 04/24/22 with Dr Harl Bowie

## 2022-04-07 NOTE — Consult Note (Signed)
General:  Well nourished, well developed, in no acute distress HEENT: normal Neck: no JVD Vascular: No carotid bruits; Distal pulses 2+ bilaterally Cardiac:  normal S1, S2; RRR; no murmur  Lungs:  clear to auscultation bilaterally, no wheezing, rhonchi or rales  Abd: soft, nontender, no hepatomegaly  Ext: no edema Musculoskeletal:  No deformities, BUE and BLE strength normal and equal Skin: warm and dry  Neuro:  CNs 2-12 intact, no focal abnormalities noted Psych:  Normal affect   EKG:  The EKG was personally reviewed and demonstrates:  NSR, LAD  Telemetry:  Telemetry was personally reviewed and demonstrates:  SR  Relevant CV Studies:  ECHO: 09/23  1. Left ventricular ejection fraction, by estimation, is 60 to 65%. The  left ventricle has normal function. The left ventricle has no regional  wall motion abnormalities. Left ventricular diastolic parameters are  consistent with Grade I diastolic  dysfunction (impaired relaxation).   2. Right ventricular systolic function is normal. The right ventricular  size is normal. There is normal pulmonary artery systolic pressure.   3. The mitral valve is normal in structure. Trivial mitral valve  regurgitation. No evidence of mitral stenosis.   4. The aortic valve is tricuspid. Aortic valve regurgitation is not  visualized. No aortic stenosis is present.   5. The inferior vena cava is normal in size with greater than 50%  respiratory variability, suggesting right atrial pressure of 3 mmHg.   Laboratory Data:  High Sensitivity Troponin:  No results for input(s): "TROPONINIHS" in the last 720 hours.    Chemistry Recent Labs  Lab 04/05/22 1245 04/06/22 0541  NA 137 139  K 3.5 3.5  CL 101 103  CO2 23 27  GLUCOSE 108* 100*  BUN 10 8  CREATININE 0.77 0.66  CALCIUM 9.9 9.6  GFRNONAA >60 >60  ANIONGAP 13 9    No results for input(s): "PROT", "ALBUMIN", "AST", "ALT", "ALKPHOS", "BILITOT" in the last 168 hours. Lipids No results for input(s): "CHOL", "TRIG", "HDL", "LABVLDL", "LDLCALC", "CHOLHDL" in the last 168 hours.  Hematology Recent Labs  Lab 04/05/22 1245 04/06/22 0541  WBC 4.9 3.8*  RBC 3.74* 3.53*  HGB 14.0 13.0  HCT 38.6* 37.4*  MCV 103.2* 105.9*  MCH 37.4* 36.8*  MCHC 36.3* 34.8  RDW 12.1 12.3  PLT 152 153   Thyroid  Recent Labs  Lab 04/06/22 0541  TSH 4.565*  FREET4 1.15*    BNPNo results for input(s): "BNP", "PROBNP" in the last 168 hours.  DDimer No results for input(s): "DDIMER" in the last 168 hours.   Radiology/Studies:  ECHOCARDIOGRAM COMPLETE  Result Date: 04/06/2022    ECHOCARDIOGRAM REPORT   Patient Name:   Jeremy Hayden Date of Exam: 04/06/2022 Medical Rec #:  [id number]       Height:       64.0 in Accession #:    [id number]      Weight:       138.0 lb Date of Birth:  [birth year]        BSA:          1.671 m Patient Age:    66 years        BP:           138/87 mmHg Patient Gender: M               HR:           83 bpm. Exam Location:  Inpatient Procedure: 2D Echo Indications:    syncope  Cardiology Consultation   Patient ID: BARNABAS HENRIQUES MRN: [id number]; DOB: 1956-03-01  Admit date: 04/05/2022 Date of Consult: 04/07/2022  PCP:  [facility] [place] Prov[facility] Cardiologist:  None new  Patient Profile:   Jeremy Hayden is a 66 y.o. male with a hx of prostate CA and HTN, who is being seen 04/07/2022 for the evaluation of syncope at the request of Dr Karleen Hampshire.  History of Present Illness:   [name] has had several episodes of syncope since June 2023. He notes these occurred back to 2013 with a negative w/u. He states he will walk and then fall without a prodrome. He then comes to. He had a cut on his head recently. His orthostatics were + here. Sitting 115/84 mmHg HR 89, standing 84/65 mmHg HR 92 bpm. He has no AVN block here.  Echo shows normal LV function. No aortic stenosis.   Past Medical History:  Diagnosis Date   Cancer [facility])    prostate   GERD (gastroesophageal reflux disease)    Hypertension     Past Surgical History:  Procedure Laterality Date   ESOPHAGOGASTRODUODENOSCOPY N/A 03/27/2015   Procedure: ESOPHAGOGASTRODUODENOSCOPY (EGD);  Surgeon: Manus Gunning, MD;  Location: [place];  Service: Gastroenterology;  Laterality: N/A;   NECK SURGERY  11/2001   multilevel c spine diskectomy, decompression, arthrodesis with bone graft and plate placed.  Dr Rennis Harding   PROSTATE SURGERY     ROTATOR CUFF REPAIR Right 2003   with partial acromiectomy/plasty     Home Medications:  Prior to Admission medications   Medication Sig Start Date End Date Taking? Authorizing Provider  Acetaminophen (STANBACK ASPIRIN FREE PO) Take by mouth.    [provider]  amLODipine (NORVASC) 5 MG tablet Take 5 mg by mouth daily.    [provider]  hydrocortisone (ANUSOL-HC) 2.5 % rectal cream Place 1 application rectally 2 (two) times daily. 08/20/21   Francene Finders, PA-C  OMEPRAZOLE PO Take by mouth.    [provider]  ondansetron (ZOFRAN ODT) 4 MG disintegrating tablet Take 1 tablet (4 mg total) by mouth every 8 (eight) hours as needed for nausea or vomiting. 02/26/21   Lamptey, Myrene Galas, MD  polyethylene glycol (MIRALAX) 17 g packet Take 17 g by mouth daily. Dissolve one cap full in solution (water, gatorade, etc.) and administer once cap-full daily. You may titrate up daily by 1 cap-full until the patient is having pudding consistency of stools. After the patient is able to start passing softer stools they will need to be on 1/2 cap-full daily for 2 weeks. 08/27/21   Couture, Cortni S, PA-C  senna-docusate (SENOKOT-S) 8.6-50 MG tablet Take 1 tablet by mouth daily. 06/19/20   Chase Picket, MD    Inpatient Medications: Scheduled Meds:  enoxaparin (LOVENOX) injection  40 mg Subcutaneous Q24H   pantoprazole  40 mg Oral Q0600   senna-docusate  1 tablet Oral Daily   Continuous Infusions:  lactated ringers 75 mL/hr at 04/07/22 0555   PRN Meds: acetaminophen **OR** acetaminophen, ondansetron **OR** ondansetron (ZOFRAN) IV  Allergies:   No Known Allergies  Social History:   Social History   Socioeconomic History   Marital status: Legally Separated    Spouse name: Not on file   Number of children: Not on file   Years of education: Not on file   Highest education level: Not on file  Occupational History   Not on file  Tobacco  General:  Well nourished, well developed, in no acute distress HEENT: normal Neck: no JVD Vascular: No carotid bruits; Distal pulses 2+ bilaterally Cardiac:  normal S1, S2; RRR; no murmur  Lungs:  clear to auscultation bilaterally, no wheezing, rhonchi or rales  Abd: soft, nontender, no hepatomegaly  Ext: no edema Musculoskeletal:  No deformities, BUE and BLE strength normal and equal Skin: warm and dry  Neuro:  CNs 2-12 intact, no focal abnormalities noted Psych:  Normal affect   EKG:  The EKG was personally reviewed and demonstrates:  NSR, LAD  Telemetry:  Telemetry was personally reviewed and demonstrates:  SR  Relevant CV Studies:  ECHO: 09/23  1. Left ventricular ejection fraction, by estimation, is 60 to 65%. The  left ventricle has normal function. The left ventricle has no regional  wall motion abnormalities. Left ventricular diastolic parameters are  consistent with Grade I diastolic  dysfunction (impaired relaxation).   2. Right ventricular systolic function is normal. The right ventricular  size is normal. There is normal pulmonary artery systolic pressure.   3. The mitral valve is normal in structure. Trivial mitral valve  regurgitation. No evidence of mitral stenosis.   4. The aortic valve is tricuspid. Aortic valve regurgitation is not  visualized. No aortic stenosis is present.   5. The inferior vena cava is normal in size with greater than 50%  respiratory variability, suggesting right atrial pressure of 3 mmHg.   Laboratory Data:  High Sensitivity Troponin:  No results for input(s): "TROPONINIHS" in the last 720 hours.    Chemistry Recent Labs  Lab 04/05/22 1245 04/06/22 0541  NA 137 139  K 3.5 3.5  CL 101 103  CO2 23 27  GLUCOSE 108* 100*  BUN 10 8  CREATININE 0.77 0.66  CALCIUM 9.9 9.6  GFRNONAA >60 >60  ANIONGAP 13 9    No results for input(s): "PROT", "ALBUMIN", "AST", "ALT", "ALKPHOS", "BILITOT" in the last 168 hours. Lipids No results for input(s): "CHOL", "TRIG", "HDL", "LABVLDL", "LDLCALC", "CHOLHDL" in the last 168 hours.  Hematology Recent Labs  Lab 04/05/22 1245 04/06/22 0541  WBC 4.9 3.8*  RBC 3.74* 3.53*  HGB 14.0 13.0  HCT 38.6* 37.4*  MCV 103.2* 105.9*  MCH 37.4* 36.8*  MCHC 36.3* 34.8  RDW 12.1 12.3  PLT 152 153   Thyroid  Recent Labs  Lab 04/06/22 0541  TSH 4.565*  FREET4 1.15*    BNPNo results for input(s): "BNP", "PROBNP" in the last 168 hours.  DDimer No results for input(s): "DDIMER" in the last 168 hours.   Radiology/Studies:  ECHOCARDIOGRAM COMPLETE  Result Date: 04/06/2022    ECHOCARDIOGRAM REPORT   Patient Name:   Jeremy Hayden Date of Exam: 04/06/2022 Medical Rec #:  [id number]       Height:       64.0 in Accession #:    [id number]      Weight:       138.0 lb Date of Birth:  [birth year]        BSA:          1.671 m Patient Age:    66 years        BP:           138/87 mmHg Patient Gender: M               HR:           83 bpm. Exam Location:  Inpatient Procedure: 2D Echo Indications:    syncope  General:  Well nourished, well developed, in no acute distress HEENT: normal Neck: no JVD Vascular: No carotid bruits; Distal pulses 2+ bilaterally Cardiac:  normal S1, S2; RRR; no murmur  Lungs:  clear to auscultation bilaterally, no wheezing, rhonchi or rales  Abd: soft, nontender, no hepatomegaly  Ext: no edema Musculoskeletal:  No deformities, BUE and BLE strength normal and equal Skin: warm and dry  Neuro:  CNs 2-12 intact, no focal abnormalities noted Psych:  Normal affect   EKG:  The EKG was personally reviewed and demonstrates:  NSR, LAD  Telemetry:  Telemetry was personally reviewed and demonstrates:  SR  Relevant CV Studies:  ECHO: 09/23  1. Left ventricular ejection fraction, by estimation, is 60 to 65%. The  left ventricle has normal function. The left ventricle has no regional  wall motion abnormalities. Left ventricular diastolic parameters are  consistent with Grade I diastolic  dysfunction (impaired relaxation).   2. Right ventricular systolic function is normal. The right ventricular  size is normal. There is normal pulmonary artery systolic pressure.   3. The mitral valve is normal in structure. Trivial mitral valve  regurgitation. No evidence of mitral stenosis.   4. The aortic valve is tricuspid. Aortic valve regurgitation is not  visualized. No aortic stenosis is present.   5. The inferior vena cava is normal in size with greater than 50%  respiratory variability, suggesting right atrial pressure of 3 mmHg.   Laboratory Data:  High Sensitivity Troponin:  No results for input(s): "TROPONINIHS" in the last 720 hours.    Chemistry Recent Labs  Lab 04/05/22 1245 04/06/22 0541  NA 137 139  K 3.5 3.5  CL 101 103  CO2 23 27  GLUCOSE 108* 100*  BUN 10 8  CREATININE 0.77 0.66  CALCIUM 9.9 9.6  GFRNONAA >60 >60  ANIONGAP 13 9    No results for input(s): "PROT", "ALBUMIN", "AST", "ALT", "ALKPHOS", "BILITOT" in the last 168 hours. Lipids No results for input(s): "CHOL", "TRIG", "HDL", "LABVLDL", "LDLCALC", "CHOLHDL" in the last 168 hours.  Hematology Recent Labs  Lab 04/05/22 1245 04/06/22 0541  WBC 4.9 3.8*  RBC 3.74* 3.53*  HGB 14.0 13.0  HCT 38.6* 37.4*  MCV 103.2* 105.9*  MCH 37.4* 36.8*  MCHC 36.3* 34.8  RDW 12.1 12.3  PLT 152 153   Thyroid  Recent Labs  Lab 04/06/22 0541  TSH 4.565*  FREET4 1.15*    BNPNo results for input(s): "BNP", "PROBNP" in the last 168 hours.  DDimer No results for input(s): "DDIMER" in the last 168 hours.   Radiology/Studies:  ECHOCARDIOGRAM COMPLETE  Result Date: 04/06/2022    ECHOCARDIOGRAM REPORT   Patient Name:   Jeremy Hayden Date of Exam: 04/06/2022 Medical Rec #:  [id number]       Height:       64.0 in Accession #:    [id number]      Weight:       138.0 lb Date of Birth:  [birth year]        BSA:          1.671 m Patient Age:    66 years        BP:           138/87 mmHg Patient Gender: M               HR:           83 bpm. Exam Location:  Inpatient Procedure: 2D Echo Indications:    syncope  Cardiology Consultation   Patient ID: BARNABAS HENRIQUES MRN: [id number]; DOB: 1956-03-01  Admit date: 04/05/2022 Date of Consult: 04/07/2022  PCP:  [facility] [place] Providers Cardiologist:  None new  Patient Profile:   Jeremy Hayden is a 66 y.o. male with a hx of prostate CA and HTN, who is being seen 04/07/2022 for the evaluation of syncope at the request of Dr Karleen Hampshire.  History of Present Illness:   [name] has had several episodes of syncope since June 2023. He notes these occurred back to 2013 with a negative w/u. He states he will walk and then fall without a prodrome. He then comes to. He had a cut on his head recently. His orthostatics were + here. Sitting 115/84 mmHg HR 89, standing 84/65 mmHg HR 92 bpm. He has no AVN block here.  Echo shows normal LV function. No aortic stenosis.   Past Medical History:  Diagnosis Date   Cancer [facility])    prostate   GERD (gastroesophageal reflux disease)    Hypertension     Past Surgical History:  Procedure Laterality Date   ESOPHAGOGASTRODUODENOSCOPY N/A 03/27/2015   Procedure: ESOPHAGOGASTRODUODENOSCOPY (EGD);  Surgeon: Manus Gunning, MD;  Location: [place];  Service: Gastroenterology;  Laterality: N/A;   NECK SURGERY  11/2001   multilevel c spine diskectomy, decompression, arthrodesis with bone graft and plate placed.  Dr Rennis Harding   PROSTATE SURGERY     ROTATOR CUFF REPAIR Right 2003   with partial acromiectomy/plasty     Home Medications:  Prior to Admission medications   Medication Sig Start Date End Date Taking? Authorizing Provider  Acetaminophen (STANBACK ASPIRIN FREE PO) Take by mouth.    [provider]  amLODipine (NORVASC) 5 MG tablet Take 5 mg by mouth daily.    [provider]  hydrocortisone (ANUSOL-HC) 2.5 % rectal cream Place 1 application rectally 2 (two) times daily. 08/20/21   Francene Finders, PA-C  OMEPRAZOLE PO Take by mouth.    [provider]  ondansetron (ZOFRAN ODT) 4 MG disintegrating tablet Take 1 tablet (4 mg total) by mouth every 8 (eight) hours as needed for nausea or vomiting. 02/26/21   Lamptey, Myrene Galas, MD  polyethylene glycol (MIRALAX) 17 g packet Take 17 g by mouth daily. Dissolve one cap full in solution (water, gatorade, etc.) and administer once cap-full daily. You may titrate up daily by 1 cap-full until the patient is having pudding consistency of stools. After the patient is able to start passing softer stools they will need to be on 1/2 cap-full daily for 2 weeks. 08/27/21   Couture, Cortni S, PA-C  senna-docusate (SENOKOT-S) 8.6-50 MG tablet Take 1 tablet by mouth daily. 06/19/20   Chase Picket, MD    Inpatient Medications: Scheduled Meds:  enoxaparin (LOVENOX) injection  40 mg Subcutaneous Q24H   pantoprazole  40 mg Oral Q0600   senna-docusate  1 tablet Oral Daily   Continuous Infusions:  lactated ringers 75 mL/hr at 04/07/22 0555   PRN Meds: acetaminophen **OR** acetaminophen, ondansetron **OR** ondansetron (ZOFRAN) IV  Allergies:   No Known Allergies  Social History:   Social History   Socioeconomic History   Marital status: Legally Separated    Spouse name: Not on file   Number of children: Not on file   Years of education: Not on file   Highest education level: Not on file  Occupational History   Not on file  Tobacco  General:  Well nourished, well developed, in no acute distress HEENT: normal Neck: no JVD Vascular: No carotid bruits; Distal pulses 2+ bilaterally Cardiac:  normal S1, S2; RRR; no murmur  Lungs:  clear to auscultation bilaterally, no wheezing, rhonchi or rales  Abd: soft, nontender, no hepatomegaly  Ext: no edema Musculoskeletal:  No deformities, BUE and BLE strength normal and equal Skin: warm and dry  Neuro:  CNs 2-12 intact, no focal abnormalities noted Psych:  Normal affect   EKG:  The EKG was personally reviewed and demonstrates:  NSR, LAD  Telemetry:  Telemetry was personally reviewed and demonstrates:  SR  Relevant CV Studies:  ECHO: 09/23  1. Left ventricular ejection fraction, by estimation, is 60 to 65%. The  left ventricle has normal function. The left ventricle has no regional  wall motion abnormalities. Left ventricular diastolic parameters are  consistent with Grade I diastolic  dysfunction (impaired relaxation).   2. Right ventricular systolic function is normal. The right ventricular  size is normal. There is normal pulmonary artery systolic pressure.   3. The mitral valve is normal in structure. Trivial mitral valve  regurgitation. No evidence of mitral stenosis.   4. The aortic valve is tricuspid. Aortic valve regurgitation is not  visualized. No aortic stenosis is present.   5. The inferior vena cava is normal in size with greater than 50%  respiratory variability, suggesting right atrial pressure of 3 mmHg.   Laboratory Data:  High Sensitivity Troponin:  No results for input(s): "TROPONINIHS" in the last 720 hours.    Chemistry Recent Labs  Lab 04/05/22 1245 04/06/22 0541  NA 137 139  K 3.5 3.5  CL 101 103  CO2 23 27  GLUCOSE 108* 100*  BUN 10 8  CREATININE 0.77 0.66  CALCIUM 9.9 9.6  GFRNONAA >60 >60  ANIONGAP 13 9    No results for input(s): "PROT", "ALBUMIN", "AST", "ALT", "ALKPHOS", "BILITOT" in the last 168 hours. Lipids No results for input(s): "CHOL", "TRIG", "HDL", "LABVLDL", "LDLCALC", "CHOLHDL" in the last 168 hours.  Hematology Recent Labs  Lab 04/05/22 1245 04/06/22 0541  WBC 4.9 3.8*  RBC 3.74* 3.53*  HGB 14.0 13.0  HCT 38.6* 37.4*  MCV 103.2* 105.9*  MCH 37.4* 36.8*  MCHC 36.3* 34.8  RDW 12.1 12.3  PLT 152 153   Thyroid  Recent Labs  Lab 04/06/22 0541  TSH 4.565*  FREET4 1.15*    BNPNo results for input(s): "BNP", "PROBNP" in the last 168 hours.  DDimer No results for input(s): "DDIMER" in the last 168 hours.   Radiology/Studies:  ECHOCARDIOGRAM COMPLETE  Result Date: 04/06/2022    ECHOCARDIOGRAM REPORT   Patient Name:   Jeremy Hayden Date of Exam: 04/06/2022 Medical Rec #:  [id number]       Height:       64.0 in Accession #:    [id number]      Weight:       138.0 lb Date of Birth:  [birth year]        BSA:          1.671 m Patient Age:    66 years        BP:           138/87 mmHg Patient Gender: M               HR:           83 bpm. Exam Location:  Inpatient Procedure: 2D Echo Indications:    syncope

## 2022-04-07 NOTE — Progress Notes (Signed)
oriented. No focal neurological deficits. Extremities: Symmetric 5 x 5 power. Skin: No rashes, lesions or ulcers Psychiatry:  Mood & affect appropriate.       Data Reviewed: I have personally reviewed following labs and imaging studies  CBC: Recent Labs  Lab 04/05/22 1245 04/06/22 0541  WBC 4.9 3.8*  NEUTROABS  --  2.6  HGB 14.0 13.0  HCT 38.6* 37.4*  MCV 103.2* 105.9*  PLT 152 153     Basic Metabolic Panel: Recent Labs  Lab 04/05/22 1245 04/06/22 0541  NA 137 139  K 3.5 3.5  CL 101 103  CO2 23 27  GLUCOSE 108* 100*  BUN 10 8  CREATININE 0.77  0.66  CALCIUM 9.9 9.6     GFR: Estimated Creatinine Clearance: 75 mL/min (by C-G formula based on SCr of 0.66 mg/dL).  Liver Function Tests: No results for input(s): "AST", "ALT", "ALKPHOS", "BILITOT", "PROT", "ALBUMIN" in the last 168 hours.  CBG: Recent Labs  Lab 04/05/22 1201 04/05/22 1540  GLUCAP 109* 102*      No results found for this or any previous visit (from the past 240 hour(s)).       Radiology Studies: ECHOCARDIOGRAM COMPLETE  Result Date: 04/06/2022    ECHOCARDIOGRAM REPORT   Patient Name:   Jeremy Hayden Date of Exam: 04/06/2022 Medical Rec #:  [id number]       Height:       64.0 in Accession #:    [id number]      Weight:       138.0 lb Date of Birth:  [birth year]        BSA:          1.671 m Patient Age:    66 years        BP:           138/87 mmHg Patient Gender: M               HR:           83 bpm. Exam Location:  Inpatient Procedure: 2D Echo Indications:    syncope  History:        Patient has prior history of Echocardiogram examinations, most                 recent 03/27/2015. Arrythmias:Atrial Fibrillation,                 Signs/Symptoms:Chest Pain and Syncope; Risk Factors:Hypertension                 and Dyslipidemia.  Sonographer:    Harvie Junior Referring Phys: [id number]  1. Left ventricular ejection fraction, by estimation, is 60 to 65%. The left ventricle has normal function. The left ventricle has no regional wall motion abnormalities. Left ventricular diastolic parameters are consistent with Grade I diastolic dysfunction (impaired relaxation).  2. Right ventricular systolic function is normal. The right ventricular size is normal. There is normal pulmonary artery systolic pressure.  3. The mitral valve is normal in structure. Trivial mitral valve regurgitation. No evidence of mitral stenosis.  4. The aortic valve is tricuspid. Aortic valve regurgitation is not visualized. No aortic stenosis is present.  5. The inferior vena cava is normal  in size with greater than 50% respiratory variability, suggesting right atrial pressure of 3 mmHg. FINDINGS  Left Ventricle: Left ventricular ejection fraction, by estimation, is 60 to 65%. The left ventricle has normal function. The left ventricle has no regional wall motion abnormalities.  PROGRESS NOTE    Jeremy Hayden  UMP:[id number] DOB: Nov 13, 1955 DOA: 04/05/2022 PCP: [facility]    Chief Complaint  Patient presents with   Loss of Consciousness    Brief Narrative:    Jeremy Hayden is a 66 y.o. male with medical history significant of hypertension, GERD, presents to ED, with recurrent episodes of syncope. As per the patient, he had been passing out since June this year. It happened about 5 times so far. The 4 th time he passed out, he was at a friend's house , coming out of the bathroom, felt dizzy, wobbly on the feet and passed out for a few minutes, was witnessed by his friend. The last episode  happened the night before admission, , he remembers being in the kitchen and passed out. He went to [facility] and was asked to come to [facility] ED.   EKG shows sinus rhythm with borderline T wave abnormalities.  CT head without contrast , does not show any acute abnn.    He was referred to [facility] for admission.   Assessment & Plan:   Principal Problem:   Syncope Active Problems:   Hyperlipidemia   Hypertension   GERD   Dehydration   Paroxysmal atrial fibrillation (HCC)   Syncope: Suspect orthostatic hypotension.  Since his orthostatic vital signs are positive and he was symptomatic this morning.  Echocardiogram showed preserved LVef  and grade 1 diastolic dysfunction  CT head and MRI brain without contrast is negative for acute stroke.  EEG ordered and pending.  Carotid duplex ordered and pending.  Overnight telemetry shows multiple pVC'S. Patient also has a h/o PAF, not on anti coagulation.  Cardiology consulted and recommendations given.  He would benefit from outpatient event monitor placement.    Orthostatic hypotension:  Fluids bolus given without much change in the orthostatic vital signs.  Patiet was started on midodrine 2.5 mg TID.  Continue with abdominal binder and compression stockings.   Hyperlipidemia:  Stable.    GERD Stable.   On PPI.   Dehydration:  On gentle hydration.    DVT prophylaxis: lovenox.  Code Status: (Full code) Family Communication: none at bedside.  Disposition:   Status is: Observation The patient will require care spanning > 2 midnights and should be moved to inpatient because: further work up for syncope.    Level of care: Med-Surg Consultants:  None.   Procedures: none.   Antimicrobials: none.    Subjective: No  dizziness with orthostatic vital signs  Objective: Vitals:   04/07/22 1030 04/07/22 1033 04/07/22 1041 04/07/22 1300  BP: 137/86 94/70 (!) 142/87 133/70  Pulse: 82 86 84 81  Resp: '18 18 18   '$ Temp: 97.6 F (36.4 C) 98.4 F (36.9 C) 98.1 F (36.7 C) 98.3 F (36.8 C)  TempSrc: Oral Oral Oral Oral  SpO2: 98% 98% 99% 100%  Weight:      Height:        Intake/Output Summary (Last 24 hours) at 04/07/2022 1835 Last data filed at 04/07/2022 1714 Gross per 24 hour  Intake 1331.25 ml  Output 2600 ml  Net -1268.75 ml    Filed Weights   04/05/22 1152  Weight: 62.6 kg    Examination:  General exam: Appears calm and comfortable  Respiratory system: Clear to auscultation. Respiratory effort normal. Cardiovascular system: S1 & S2 heard, RRR. No JVD, No pedal edema. Gastrointestinal system: Abdomen is nondistended, soft and nontender. Normal bowel sounds heard. Central nervous system: Alert and  oriented. No focal neurological deficits. Extremities: Symmetric 5 x 5 power. Skin: No rashes, lesions or ulcers Psychiatry:  Mood & affect appropriate.       Data Reviewed: I have personally reviewed following labs and imaging studies  CBC: Recent Labs  Lab 04/05/22 1245 04/06/22 0541  WBC 4.9 3.8*  NEUTROABS  --  2.6  HGB 14.0 13.0  HCT 38.6* 37.4*  MCV 103.2* 105.9*  PLT 152 153     Basic Metabolic Panel: Recent Labs  Lab 04/05/22 1245 04/06/22 0541  NA 137 139  K 3.5 3.5  CL 101 103  CO2 23 27  GLUCOSE 108* 100*  BUN 10 8  CREATININE 0.77  0.66  CALCIUM 9.9 9.6     GFR: Estimated Creatinine Clearance: 75 mL/min (by C-G formula based on SCr of 0.66 mg/dL).  Liver Function Tests: No results for input(s): "AST", "ALT", "ALKPHOS", "BILITOT", "PROT", "ALBUMIN" in the last 168 hours.  CBG: Recent Labs  Lab 04/05/22 1201 04/05/22 1540  GLUCAP 109* 102*      No results found for this or any previous visit (from the past 240 hour(s)).       Radiology Studies: ECHOCARDIOGRAM COMPLETE  Result Date: 04/06/2022    ECHOCARDIOGRAM REPORT   Patient Name:   Jeremy Hayden Date of Exam: 04/06/2022 Medical Rec #:  [id number]       Height:       64.0 in Accession #:    [id number]      Weight:       138.0 lb Date of Birth:  [birth year]        BSA:          1.671 m Patient Age:    66 years        BP:           138/87 mmHg Patient Gender: M               HR:           83 bpm. Exam Location:  Inpatient Procedure: 2D Echo Indications:    syncope  History:        Patient has prior history of Echocardiogram examinations, most                 recent 03/27/2015. Arrythmias:Atrial Fibrillation,                 Signs/Symptoms:Chest Pain and Syncope; Risk Factors:Hypertension                 and Dyslipidemia.  Sonographer:    Harvie Junior Referring Phys: [id number]  1. Left ventricular ejection fraction, by estimation, is 60 to 65%. The left ventricle has normal function. The left ventricle has no regional wall motion abnormalities. Left ventricular diastolic parameters are consistent with Grade I diastolic dysfunction (impaired relaxation).  2. Right ventricular systolic function is normal. The right ventricular size is normal. There is normal pulmonary artery systolic pressure.  3. The mitral valve is normal in structure. Trivial mitral valve regurgitation. No evidence of mitral stenosis.  4. The aortic valve is tricuspid. Aortic valve regurgitation is not visualized. No aortic stenosis is present.  5. The inferior vena cava is normal  in size with greater than 50% respiratory variability, suggesting right atrial pressure of 3 mmHg. FINDINGS  Left Ventricle: Left ventricular ejection fraction, by estimation, is 60 to 65%. The left ventricle has normal function. The left ventricle has no regional wall motion abnormalities.  oriented. No focal neurological deficits. Extremities: Symmetric 5 x 5 power. Skin: No rashes, lesions or ulcers Psychiatry:  Mood & affect appropriate.       Data Reviewed: I have personally reviewed following labs and imaging studies  CBC: Recent Labs  Lab 04/05/22 1245 04/06/22 0541  WBC 4.9 3.8*  NEUTROABS  --  2.6  HGB 14.0 13.0  HCT 38.6* 37.4*  MCV 103.2* 105.9*  PLT 152 153     Basic Metabolic Panel: Recent Labs  Lab 04/05/22 1245 04/06/22 0541  NA 137 139  K 3.5 3.5  CL 101 103  CO2 23 27  GLUCOSE 108* 100*  BUN 10 8  CREATININE 0.77  0.66  CALCIUM 9.9 9.6     GFR: Estimated Creatinine Clearance: 75 mL/min (by C-G formula based on SCr of 0.66 mg/dL).  Liver Function Tests: No results for input(s): "AST", "ALT", "ALKPHOS", "BILITOT", "PROT", "ALBUMIN" in the last 168 hours.  CBG: Recent Labs  Lab 04/05/22 1201 04/05/22 1540  GLUCAP 109* 102*      No results found for this or any previous visit (from the past 240 hour(s)).       Radiology Studies: ECHOCARDIOGRAM COMPLETE  Result Date: 04/06/2022    ECHOCARDIOGRAM REPORT   Patient Name:   Jeremy Hayden Date of Exam: 04/06/2022 Medical Rec #:  [id number]       Height:       64.0 in Accession #:    [id number]      Weight:       138.0 lb Date of Birth:  [birth year]        BSA:          1.671 m Patient Age:    66 years        BP:           138/87 mmHg Patient Gender: M               HR:           83 bpm. Exam Location:  Inpatient Procedure: 2D Echo Indications:    syncope  History:        Patient has prior history of Echocardiogram examinations, most                 recent 03/27/2015. Arrythmias:Atrial Fibrillation,                 Signs/Symptoms:Chest Pain and Syncope; Risk Factors:Hypertension                 and Dyslipidemia.  Sonographer:    Harvie Junior Referring Phys: [id number]  1. Left ventricular ejection fraction, by estimation, is 60 to 65%. The left ventricle has normal function. The left ventricle has no regional wall motion abnormalities. Left ventricular diastolic parameters are consistent with Grade I diastolic dysfunction (impaired relaxation).  2. Right ventricular systolic function is normal. The right ventricular size is normal. There is normal pulmonary artery systolic pressure.  3. The mitral valve is normal in structure. Trivial mitral valve regurgitation. No evidence of mitral stenosis.  4. The aortic valve is tricuspid. Aortic valve regurgitation is not visualized. No aortic stenosis is present.  5. The inferior vena cava is normal  in size with greater than 50% respiratory variability, suggesting right atrial pressure of 3 mmHg. FINDINGS  Left Ventricle: Left ventricular ejection fraction, by estimation, is 60 to 65%. The left ventricle has normal function. The left ventricle has no regional wall motion abnormalities.  oriented. No focal neurological deficits. Extremities: Symmetric 5 x 5 power. Skin: No rashes, lesions or ulcers Psychiatry:  Mood & affect appropriate.       Data Reviewed: I have personally reviewed following labs and imaging studies  CBC: Recent Labs  Lab 04/05/22 1245 04/06/22 0541  WBC 4.9 3.8*  NEUTROABS  --  2.6  HGB 14.0 13.0  HCT 38.6* 37.4*  MCV 103.2* 105.9*  PLT 152 153     Basic Metabolic Panel: Recent Labs  Lab 04/05/22 1245 04/06/22 0541  NA 137 139  K 3.5 3.5  CL 101 103  CO2 23 27  GLUCOSE 108* 100*  BUN 10 8  CREATININE 0.77  0.66  CALCIUM 9.9 9.6     GFR: Estimated Creatinine Clearance: 75 mL/min (by C-G formula based on SCr of 0.66 mg/dL).  Liver Function Tests: No results for input(s): "AST", "ALT", "ALKPHOS", "BILITOT", "PROT", "ALBUMIN" in the last 168 hours.  CBG: Recent Labs  Lab 04/05/22 1201 04/05/22 1540  GLUCAP 109* 102*      No results found for this or any previous visit (from the past 240 hour(s)).       Radiology Studies: ECHOCARDIOGRAM COMPLETE  Result Date: 04/06/2022    ECHOCARDIOGRAM REPORT   Patient Name:   Jeremy Hayden Date of Exam: 04/06/2022 Medical Rec #:  [id number]       Height:       64.0 in Accession #:    [id number]      Weight:       138.0 lb Date of Birth:  [birth year]        BSA:          1.671 m Patient Age:    66 years        BP:           138/87 mmHg Patient Gender: M               HR:           83 bpm. Exam Location:  Inpatient Procedure: 2D Echo Indications:    syncope  History:        Patient has prior history of Echocardiogram examinations, most                 recent 03/27/2015. Arrythmias:Atrial Fibrillation,                 Signs/Symptoms:Chest Pain and Syncope; Risk Factors:Hypertension                 and Dyslipidemia.  Sonographer:    Harvie Junior Referring Phys: [id number]  1. Left ventricular ejection fraction, by estimation, is 60 to 65%. The left ventricle has normal function. The left ventricle has no regional wall motion abnormalities. Left ventricular diastolic parameters are consistent with Grade I diastolic dysfunction (impaired relaxation).  2. Right ventricular systolic function is normal. The right ventricular size is normal. There is normal pulmonary artery systolic pressure.  3. The mitral valve is normal in structure. Trivial mitral valve regurgitation. No evidence of mitral stenosis.  4. The aortic valve is tricuspid. Aortic valve regurgitation is not visualized. No aortic stenosis is present.  5. The inferior vena cava is normal  in size with greater than 50% respiratory variability, suggesting right atrial pressure of 3 mmHg. FINDINGS  Left Ventricle: Left ventricular ejection fraction, by estimation, is 60 to 65%. The left ventricle has normal function. The left ventricle has no regional wall motion abnormalities.

## 2022-04-08 DIAGNOSIS — I10 Essential (primary) hypertension: Secondary | ICD-10-CM | POA: Diagnosis not present

## 2022-04-08 DIAGNOSIS — K219 Gastro-esophageal reflux disease without esophagitis: Secondary | ICD-10-CM | POA: Diagnosis not present

## 2022-04-08 DIAGNOSIS — E860 Dehydration: Secondary | ICD-10-CM | POA: Diagnosis not present

## 2022-04-08 DIAGNOSIS — R55 Syncope and collapse: Secondary | ICD-10-CM | POA: Diagnosis not present

## 2022-04-08 LAB — CBC WITH DIFFERENTIAL/PLATELET
Abs Immature Granulocytes: 0.01 10*3/uL (ref 0.00–0.07)
Basophils Absolute: 0 10*3/uL (ref 0.0–0.1)
Basophils Relative: 1 %
Eosinophils Absolute: 0.1 10*3/uL (ref 0.0–0.5)
Eosinophils Relative: 1 %
HCT: 39 % (ref 39.0–52.0)
Hemoglobin: 13.7 g/dL (ref 13.0–17.0)
Immature Granulocytes: 0 %
Lymphocytes Relative: 19 %
Lymphs Abs: 0.9 10*3/uL (ref 0.7–4.0)
MCH: 37.3 pg — ABNORMAL HIGH (ref 26.0–34.0)
MCHC: 35.1 g/dL (ref 30.0–36.0)
MCV: 106.3 fL — ABNORMAL HIGH (ref 80.0–100.0)
Monocytes Absolute: 0.8 10*3/uL (ref 0.1–1.0)
Monocytes Relative: 17 %
Neutro Abs: 2.9 10*3/uL (ref 1.7–7.7)
Neutrophils Relative %: 62 %
Platelets: 191 10*3/uL (ref 150–400)
RBC: 3.67 MIL/uL — ABNORMAL LOW (ref 4.22–5.81)
RDW: 12 % (ref 11.5–15.5)
WBC: 4.6 10*3/uL (ref 4.0–10.5)
nRBC: 0 % (ref 0.0–0.2)

## 2022-04-08 LAB — BASIC METABOLIC PANEL
Anion gap: 9 (ref 5–15)
BUN: 6 mg/dL — ABNORMAL LOW (ref 8–23)
CO2: 27 mmol/L (ref 22–32)
Calcium: 9.5 mg/dL (ref 8.9–10.3)
Chloride: 102 mmol/L (ref 98–111)
Creatinine, Ser: 0.77 mg/dL (ref 0.61–1.24)
GFR, Estimated: >60 mL/min (ref >60)
Glucose, Bld: 107 mg/dL — ABNORMAL HIGH (ref 70–99)
Potassium: 3.3 mmol/L — ABNORMAL LOW (ref 3.5–5.1)
Sodium: 138 mmol/L (ref 135–145)

## 2022-04-08 MED ORDER — MIDODRINE HCL 5 MG PO TABS
5.0000 mg | ORAL_TABLET | Freq: Three times a day (TID) | ORAL | Status: DC
  Administered 2022-04-08 – 2022-04-09 (×3): 5 mg via ORAL
  Filled 2022-04-08 (×3): qty 1

## 2022-04-08 MED ORDER — POTASSIUM CHLORIDE CRYS ER 20 MEQ PO TBCR
40.0000 meq | EXTENDED_RELEASE_TABLET | Freq: Once | ORAL | Status: AC
  Administered 2022-04-08: 40 meq via ORAL
  Filled 2022-04-08: qty 2

## 2022-04-08 NOTE — TOC Initial Note (Signed)
Transition of Care [facility]) - Initial/Assessment Note    Patient Details  Name: Jeremy Hayden MRN: [id number] Date of Birth: May 15, 1956  Transition of Care [facility]) CM/SW Contact:    Otelia Santee, LCSW Phone Number: 04/08/2022, 10:35 AM  Clinical Narrative:                 CSW met with pt to discuss recommendations for OP rehab. CSW informed pt he will need to follow up with his PCP and inform them of recommendation for outpatient rehab as they will need to refer pt to this service. Pt reports his PCP is through the [facility] clinic and states he has an appointment at the end of October but, has been told they are working to get him an earlier appointment. Pt states he does struggle to get to appointments due to lack of transportation. CSW discussed transportation that can be provided through insurance. Pt has [facility] Medicare and both are able to arrange transportation for appointments. Pt was unaware insurance would assist with transportation needs but, agrees to call them for future transportation needs. Pt also recommended for cane for home use. Pt is agreeable to having cane ordered. CSW has contacted [employer] to order cane and will have cane delivered to room prior to discharge.   Expected Discharge Plan: OP Rehab Barriers to Discharge: Continued Medical Work up   Patient Goals and CMS Choice Patient states their goals for this hospitalization and ongoing recovery are:: To return home   Choice offered to / list presented to : Patient  Expected Discharge Plan and Services Expected Discharge Plan: OP Rehab In-house Referral: NA Discharge Planning Services: NA Post Acute Care Choice: Durable Medical Equipment Living arrangements for the past 2 months: Single Family Home                 DME Arranged: [facility] Mor DME Agency: [employer] Date DME Agency Contacted: 04/08/22 Time DME Agency Contacted: [phone] Representative spoke with at DME Agency: Duwayne Heck            Prior  Living Arrangements/Services Living arrangements for the past 2 months: Single Family Home Lives with:: Self Patient language and need for interpreter reviewed:: Yes Do you feel safe going back to the place where you live?: Yes      Need for Family Participation in Patient Care: No (Comment) Care giver support system in place?: No (comment)   Criminal Activity/Legal Involvement Pertinent to Current Situation/Hospitalization: No - Comment as needed  Activities of Daily Living Home Assistive Devices/Equipment: None ADL Screening (condition at time of admission) Patient's cognitive ability adequate to safely complete daily activities?: Yes Is the patient deaf or have difficulty hearing?: No Does the patient have difficulty seeing, even when wearing glasses/contacts?: No Does the patient have difficulty concentrating, remembering, or making decisions?: No Patient able to express need for assistance with ADLs?: Yes Does the patient have difficulty dressing or bathing?: No Independently performs ADLs?: Yes (appropriate for developmental age) Does the patient have difficulty walking or climbing stairs?: No Weakness of Legs: None Weakness of Arms/Hands: None  Permission Sought/Granted Permission sought to share information with : Case Manager, PCP Permission granted to share information with : Yes, Verbal Permission Granted     Permission granted to share info w AGENCY: [facility]        Emotional Assessment Appearance:: Appears stated age Attitude/Demeanor/Rapport: Engaged Affect (typically observed): Accepting Orientation: : Oriented to Self, Oriented to Place, Oriented to  Time, Oriented to  Situation Alcohol / Substance Use: Alcohol Use Psych Involvement: No (comment)  Admission diagnosis:  Syncope and collapse [R55] Syncope [R55] Patient Active Problem List   Diagnosis Date Noted   Syncope 04/05/2022   Hematemesis with nausea    Dehydration 03/26/2015   Chest pain  03/26/2015   Gastroenteritis 03/26/2015   Paroxysmal atrial fibrillation (HCC) 03/26/2015   Blood in stool    TINEA CAPITIS 07/01/2006   Hyperlipidemia 07/01/2006   Hypertension 07/01/2006   GERD 07/01/2006   DISC DISEASE, CERVICAL 07/01/2006   PCP:  [facility] Pharmacy:   [id number] - [place]), [place] - [address] [place] ([place]) [place] [address] Phone: [phone] Fax: [phone]  [facility] - [place], [place] - [address] [place] [address] Phone: [phone] Fax: [phone]     Social Determinants of Health (SDOH) Interventions Housing Interventions: Patient Refused Transportation Interventions: Patient Resources (Friends/Family), Other (Comment) (Resources provided)  Readmission Risk Interventions    04/08/2022   10:34 AM  Readmission Risk Prevention Plan  Post Dischage Appt Complete  Medication Screening Complete  Transportation Screening Complete

## 2022-04-08 NOTE — Plan of Care (Signed)

## 2022-04-08 NOTE — Progress Notes (Signed)
PROGRESS NOTE    Jeremy Hayden  ONG:[id number] DOB: [birth year] DOA: 04/05/2022 PCP: [facility]    Chief Complaint  Patient presents with   Loss of Consciousness    Brief Narrative:    Jeremy Hayden is a 66 y.o. male with medical history significant of hypertension, GERD, presents to ED, with recurrent episodes of syncope. As per the patient, he had been passing out since June this year. It happened about 5 times so far. The 4 th time he passed out, he was at a friend's house , coming out of the bathroom, felt dizzy, wobbly on the feet and passed out for a few minutes, was witnessed by his friend. The last episode  happened the night before admission, , he remembers being in the kitchen and passed out. He went to [facility] and was asked to come to [facility] ED.   EKG shows sinus rhythm with borderline T wave abnormalities.  CT head without contrast , does not show any acute abnn.    He was referred to [facility] for admission.   Assessment & Plan:   Principal Problem:   Syncope Active Problems:   Hyperlipidemia   Hypertension   GERD   Dehydration   Paroxysmal atrial fibrillation (HCC)   Syncope: Suspect orthostatic hypotension.   his orthostatic vital signs were  positive and he was symptomatic this morning.  Echocardiogram showed preserved LVef  and grade 1 diastolic dysfunction  CT head and MRI brain without contrast is negative for acute stroke.  Overnight telemetry shows multiple pVC'S. Patient also has a h/o PAF, not on anti coagulation.  Cardiology consulted and recommendations given.  He would benefit from outpatient event monitor placement.  Increase midodrine to 5 mg TID.    Orthostatic hypotension:  Fluids bolus given without much change in the orthostatic vital signs.  Patiet was started on midodrine 2.5 mg TID, INCREASE TO 5 MG tid, Since he is still orthostatic. Continue with abdominal binder and compression stockings.   Hyperlipidemia:  Stable.     GERD Stable.  On PPI.   Dehydration:  Appears to have resolved.   Hypokalemia Replaced.    DVT prophylaxis: lovenox.  Code Status: (Full code) Family Communication: none at bedside.  Disposition:   Status is: Observation The patient will require care spanning > 2 midnights and should be moved to inpatient because:when his orthostatics improve.    Level of care: Telemetry Consultants:  None.   Procedures: none.   Antimicrobials: none.    Subjective: Positive orthostatics today.   Objective: Vitals:   04/07/22 1041 04/07/22 1300 04/07/22 2159 04/08/22 0453  BP: (!) 142/87 133/70 (!) 145/90 (!) 179/102  Pulse: 84 81 77 69  Resp: 18  19 19   Temp: 98.1 F (36.7 C) 98.3 F (36.8 C) 98.9 F (37.2 C) 98.8 F (37.1 C)  TempSrc: Oral Oral    SpO2: 99% 100% 100% 98%  Weight:      Height:        Intake/Output Summary (Last 24 hours) at 04/08/2022 1107 Last data filed at 04/08/2022 0905 Gross per 24 hour  Intake 2678.75 ml  Output 1550 ml  Net 1128.75 ml    Filed Weights   04/05/22 1152  Weight: 62.6 kg    Examination:  General exam: Appears calm and comfortable  Respiratory system: Clear to auscultation. Respiratory effort normal. Cardiovascular system: S1 & S2 heard, RRR. No JVD,s. No pedal edema. Gastrointestinal system: Abdomen is nondistended, soft and nontender.  Normal bowel sounds heard. Central nervous system: Alert and oriented. No focal neurological deficits. Extremities: Symmetric 5 x 5 power. Skin: No rashes, lesions or ulcers Psychiatry: . Mood & affect appropriate.      Data Reviewed: I have personally reviewed following labs and imaging studies  CBC: Recent Labs  Lab 04/05/22 1245 04/06/22 0541 04/08/22 0504  WBC 4.9 3.8* 4.6  NEUTROABS  --  2.6 2.9  HGB 14.0 13.0 13.7  HCT 38.6* 37.4* 39.0  MCV 103.2* 105.9* 106.3*  PLT 152 153 191     Basic Metabolic Panel: Recent Labs  Lab 04/05/22 1245 04/06/22 0541  04/08/22 0504  NA 137 139 138  K 3.5 3.5 3.3*  CL 101 103 102  CO2 23 27 27   GLUCOSE 108* 100* 107*  BUN 10 8 6*  CREATININE 0.77 0.66 0.77  CALCIUM 9.9 9.6 9.5     GFR: Estimated Creatinine Clearance: 75 mL/min (by C-G formula based on SCr of 0.77 mg/dL).  Liver Function Tests: No results for input(s): "AST", "ALT", "ALKPHOS", "BILITOT", "PROT", "ALBUMIN" in the last 168 hours.  CBG: Recent Labs  Lab 04/05/22 1201 04/05/22 1540  GLUCAP 109* 102*      No results found for this or any previous visit (from the past 240 hour(s)).       Radiology Studies: No results found.      Scheduled Meds:  enoxaparin (LOVENOX) injection  40 mg Subcutaneous Q24H   midodrine  5 mg Oral TID WC   pantoprazole  40 mg Oral Q0600   senna-docusate  1 tablet Oral Daily   Continuous Infusions:     LOS: 1 day        Kathlen Mody, MD [facility]   To contact the attending provider between 7A-7P or the covering provider during after hours 7P-7A, please log into the web site [email or web address] and access using universal [place] password for that web site. If you do not have the password, please call the hospital operator.  04/08/2022, 11:07 AM

## 2022-04-08 NOTE — Progress Notes (Signed)
SDOH Interventions Today    Flowsheet Row Most Recent Value  SDOH Interventions   Housing Interventions Patient Refused  Transportation Interventions Patient Resources (Friends/Family), Other (Comment) (P)   [Resources provided]

## 2022-04-09 DIAGNOSIS — E860 Dehydration: Secondary | ICD-10-CM | POA: Diagnosis not present

## 2022-04-09 DIAGNOSIS — K219 Gastro-esophageal reflux disease without esophagitis: Secondary | ICD-10-CM | POA: Diagnosis not present

## 2022-04-09 DIAGNOSIS — R55 Syncope and collapse: Secondary | ICD-10-CM | POA: Diagnosis not present

## 2022-04-09 DIAGNOSIS — I10 Essential (primary) hypertension: Secondary | ICD-10-CM | POA: Diagnosis not present

## 2022-04-09 LAB — T3, FREE: T3, Free: 2.9 pg/mL (ref 2.0–4.4)

## 2022-04-09 MED ORDER — LACTATED RINGERS IV BOLUS
1000.0000 mL | Freq: Once | INTRAVENOUS | Status: AC
  Administered 2022-04-09: 1000 mL via INTRAVENOUS

## 2022-04-09 MED ORDER — MIDODRINE HCL 5 MG PO TABS
10.0000 mg | ORAL_TABLET | Freq: Three times a day (TID) | ORAL | Status: DC
  Administered 2022-04-09 – 2022-04-11 (×6): 10 mg via ORAL
  Filled 2022-04-09 (×6): qty 2

## 2022-04-09 NOTE — Plan of Care (Signed)
Patient is still Orthostatic with c/o dizziness with position changes, MD made aware

## 2022-04-09 NOTE — Progress Notes (Signed)
PROGRESS NOTE    Jeremy Hayden  HYQ:[id number] DOB: 10/25/1955 DOA: 04/05/2022 [facility]CP: [facility]    Chief Complaint  Patient presents with   Loss of Consciousness    Brief Narrative:    Jeremy Hayden is a 66 y.o. male with medical history significant of hypertension, GERD, presents to ED, with recurrent episodes of syncope. As per the patient, he had been passing out since June this year. It happened about 5 times so far. The 4 th time he passed out, he was at a friend's house , coming out of the bathroom, felt dizzy, wobbly on the feet and passed out for a few minutes, was witnessed by his friend. The last episode  happened the night before admission, , he remembers being in the kitchen and passed out. He went to [facility] and was asked to come to [facility] ED.   EKG shows sinus rhythm with borderline T wave abnormalities.  CT head without contrast , does not show any acute abnn.   He was referred to [facility] for admission.  MRI brain does not show any acute stroke. He was found to be profoundly orthostatic.   Assessment & Plan:   Principal Problem:   Syncope Active Problems:   Hyperlipidemia   Hypertension   GERD   Dehydration   Paroxysmal atrial fibrillation (HCC)   Syncope: Suspect orthostatic hypotension from autonomic dysfunction.  His orthostatic vital signs were positive and he was symptomatic.  Echocardiogram showed preserved LVef  and grade 1 diastolic dysfunction  CT head and MRI brain without contrast is negative for acute stroke.  Telemetry shows multiple pVC'S, NSR. Patient also has a h/o PAF, not on anti coagulation.  Cardiology consulted and recommendations given. He was started on midodrine, and we have slowly increased the dose to 10 mg tid today. If no improvement he would benefit from mestinon. Please continue with abdominal binder and compression stockings.  He would benefit from outpatient event monitor placement.     Orthostatic hypotension:   Fluids bolus given without much change in the orthostatic vital signs.  Patiet was started on midodrine and slowly increased to 10 mg TID , Since he is still orthostatic and symptomatic. Ordered another fluid bolus.  Continue with abdominal binder and compression stockings.   Hyperlipidemia:  Stable.    GERD Stable.  On PPI.   Dehydration:  Appears to have resolved.   Hypokalemia Replaced.    DVT prophylaxis: lovenox.  Code Status: (Full code) Family Communication: none at bedside. Discussed with niece over te hpone.  Disposition:   Status is: Inpatient.  Home when orthostatics improve   Level of care: Telemetry Consultants:  cardiology  Procedures: echocardiogram.   Antimicrobials: none.    Subjective: Pt symptomatic today with positive orthostatics.   Objective: Vitals:   04/09/22 0852 04/09/22 0854 04/09/22 0856 04/09/22 1230  BP: 102/70 95/73 (!) 70/56 100/72  Pulse: 79 80 81 79  Resp:    16  Temp:    97.9 F (36.6 C)  TempSrc:    Oral  SpO2:    97%  Weight:      Height:        Intake/Output Summary (Last 24 hours) at 04/09/2022 1614 Last data filed at 04/09/2022 1244 Gross per 24 hour  Intake 1100 ml  Output --  Net 1100 ml    Filed Weights   04/05/22 1152  Weight: 62.6 kg    Examination:  General exam: Appears calm and comfortable  Respiratory system: Clear to auscultation. Respiratory effort normal. Cardiovascular system: S1 & S2 heard, RRR. No JVD, . No pedal edema. Gastrointestinal system: Abdomen is nondistended, soft and nontender.  Normal bowel sounds heard. Central nervous system: Alert and oriented. No focal neurological deficits. Extremities: Symmetric 5 x 5 power. Skin: No rashes, lesions or ulcers Psychiatry:  Mood & affect appropriate.       Data Reviewed: I have personally reviewed following labs and imaging studies  CBC: Recent Labs  Lab 04/05/22 1245 04/06/22 0541 04/08/22 0504  WBC 4.9 3.8* 4.6  NEUTROABS   --  2.6 2.9  HGB 14.0 13.0 13.7  HCT 38.6* 37.4* 39.0  MCV 103.2* 105.9* 106.3*  PLT 152 153 191     Basic Metabolic Panel: Recent Labs  Lab 04/05/22 1245 04/06/22 0541 04/08/22 0504  NA 137 139 138  K 3.5 3.5 3.3*  CL 101 103 102  CO2 23 27 27   GLUCOSE 108* 100* 107*  BUN 10 8 6*  CREATININE 0.77 0.66 0.77  CALCIUM 9.9 9.6 9.5     GFR: Estimated Creatinine Clearance: 75 mL/min (by C-G formula based on SCr of 0.77 mg/dL).  Liver Function Tests: No results for input(s): "AST", "ALT", "ALKPHOS", "BILITOT", "PROT", "ALBUMIN" in the last 168 hours.  CBG: Recent Labs  Lab 04/05/22 1201 04/05/22 1540  GLUCAP 109* 102*      No results found for this or any previous visit (from the past 240 hour(s)).       Radiology Studies: No results found.      Scheduled Meds:  enoxaparin (LOVENOX) injection  40 mg Subcutaneous Q24H   midodrine  10 mg Oral TID WC   pantoprazole  40 mg Oral Q0600   senna-docusate  1 tablet Oral Daily   Continuous Infusions:     LOS: 2 days        Kathlen Mody, MD [facility]   To contact the attending provider between 7A-7P or the covering provider during after hours 7P-7A, please log into the web site [email or web address] and access using universal [place] password for that web site. If you do not have the password, please call the hospital operator.  04/09/2022, 4:14 PM

## 2022-04-10 DIAGNOSIS — R55 Syncope and collapse: Secondary | ICD-10-CM | POA: Diagnosis not present

## 2022-04-10 DIAGNOSIS — E785 Hyperlipidemia, unspecified: Secondary | ICD-10-CM

## 2022-04-10 DIAGNOSIS — K219 Gastro-esophageal reflux disease without esophagitis: Secondary | ICD-10-CM | POA: Diagnosis not present

## 2022-04-10 DIAGNOSIS — E860 Dehydration: Secondary | ICD-10-CM | POA: Diagnosis not present

## 2022-04-10 MED ORDER — POTASSIUM CHLORIDE 20 MEQ PO PACK
60.0000 meq | PACK | Freq: Once | ORAL | Status: AC
  Administered 2022-04-10: 60 meq via ORAL
  Filled 2022-04-10: qty 3
  Filled 2022-04-10: qty 2

## 2022-04-10 MED ORDER — SODIUM CHLORIDE 0.9 % IV SOLN: INTRAVENOUS | Status: DC

## 2022-04-10 MED ORDER — SODIUM CHLORIDE 0.9 % IV BOLUS
1000.0000 mL | Freq: Once | INTRAVENOUS | Status: AC
  Administered 2022-04-10: 1000 mL via INTRAVENOUS

## 2022-04-10 NOTE — Progress Notes (Addendum)
PROGRESS NOTE    Jeremy Hayden  GEX:[id number] DOB: 1955/10/19 DOA: 04/05/2022 PCP: [facility]    Brief Narrative:   Jeremy Hayden is a 66 y.o. male with past medical history significant for essential hypertension, GERD who presented to [facility] ED on 9/22 by direction of the urgent care center for recurrent syncopal episodes.  Patient reports onset since June of this year, roughly now 5 episodes.  The fourth episode happened at a friend's house and he was reportedly coming out of the bathroom in which she felt dizzy with unbalanced on his feet and subsequent passed out for few minutes that was witnessed by his friend.  Most recent episode was the night prior to admission as he remembers being in the kitchen and he passed out.  He initially went to urgent care and was asked to come to the ED for further evaluation.  He denied any prodromal symptoms.  He denies chest pain, no shortness of breath, no nausea/vomiting, no abdominal pain, no rectal bleeding, no urinary symptoms, no paresthesias, no fever, no headache.  In the ED, temperature 97.8 F, HR 86, RR 16, BP 134/90, SPO2 99% on room air.  WBC 4.9, hemoglobin 14.0, platelets 152.  Sodium 137, potassium 3.5, chloride 101, CO2 23, glucose 108, BUN 10, creatinine 0.77.  Urinalysis unrevealing.  CT head without contrast with no acute intracranial abnormality.  MR brain without contrast with no acute intracranial Gershon Mussel, age-related cerebral atrophy with chronic microvascular ischemic disease.  EKG with NSR, borderline T wave abnormalities anterior leads, no significant change from previous tracing.  Patient was started on IV fluids.  EDP consulted [facility] for further evaluation and management of recurrent syncopal episodes.  Assessment & Plan:   Syncope, recurrent Orthostatic hypotension/autonomic dysfunction Patient presenting to ED with recurrent syncopal episodes.  Patient was notably orthostatic in the setting of antihypertensive use  outpatient with amlodipine.  Patient was given IV fluid hydration.  CT head/MRI brain unrevealing.  TTE with LVEF 60-65%, grade 1 diastolic dysfunction, RV systolic function normal, IVC normal in size.  Patient was seen by cardiology, patient possesses normal AV conduction without aortic stenosis.  Recommended initiation of midodrine, compression stockings and abdominal binder with outpatient follow-up. -- Repeat NS bolus -- Midodrine 10 mg p.o. TID -- Compression stockings/abdominal binder -- seen by PT with recommendations of outpatient physical therapy on discharge -- Has outpatient follow-up scheduled with cardiology and neurology -- repeat orthostatic vital signs in the morning  Hypokalemia Potassium 3.3, will replete. -- Repeat electrolytes in a.m. to include magnesium  History of essential hypertension Discontinued home amlodipine  GERD: Continue PPI   DVT prophylaxis: enoxaparin (LOVENOX) injection 40 mg Start: 04/05/22 2200    Code Status: Full Code Family Communication: No family present at bedside this morning  Disposition Plan:  Level of care: Telemetry Status is: Inpatient Remains inpatient appropriate because: Remains orthostatic with symptoms of dizziness    Consultants:  Cardiology  Procedures:  TEE  Antimicrobials:  None   Subjective: Patient seen examined bedside, resting comfortably.  No specific complaints this morning.  Repeat orthostatic vital signs positive with symptoms of dizziness.  We will repeat IV fluid bolus and continue on midodrine.  PT with no recommendations other than outpatient follow-up.  Has cardiology/neurology follow-up scheduled.  Denies headache, no chest pain, no palpitations, no shortness of breath, no fever/chills/night sweats, no nausea/vomiting/diarrhea, no focal weakness, no fatigue, no paresthesias.  No acute events overnight per nursing staff.  Objective: Vitals:  04/09/22 1230 04/09/22 1951 04/10/22 0533 04/10/22 1406   BP: 100/72 (!) 164/95 136/82 (!) 177/93  Pulse: 79 69 68 (!) 57  Resp: 16 18 18    Temp: 97.9 F (36.6 C) 98.3 F (36.8 C) 98.1 F (36.7 C) 97.6 F (36.4 C)  TempSrc: Oral Oral Oral Oral  SpO2: 97% 99% 99% 100%  Weight:      Height:        Intake/Output Summary (Last 24 hours) at 04/10/2022 1551 Last data filed at 04/10/2022 1520 Gross per 24 hour  Intake 1110 ml  Output 300 ml  Net 810 ml   Filed Weights   04/05/22 1152  Weight: 62.6 kg    Examination:  Physical Exam: GEN: NAD, alert and oriented x 3, wd/wn HEENT: NCAT, PERRL, EOMI, sclera clear, MMM PULM: CTAB w/o wheezes/crackles, normal respiratory effort, on room air CV: RRR w/o M/G/R GI: abd soft, NTND, NABS, no R/G/M MSK: no peripheral edema, muscle strength globally intact 5/5 bilateral upper/lower extremities NEURO: CN II-XII intact, no focal deficits, sensation to light touch intact PSYCH: normal mood/affect Integumentary: dry/intact, no rashes or wounds    Data Reviewed: I have personally reviewed following labs and imaging studies  CBC: Recent Labs  Lab 04/05/22 1245 04/06/22 0541 04/08/22 0504  WBC 4.9 3.8* 4.6  NEUTROABS  --  2.6 2.9  HGB 14.0 13.0 13.7  HCT 38.6* 37.4* 39.0  MCV 103.2* 105.9* 106.3*  PLT 152 153 191   Basic Metabolic Panel: Recent Labs  Lab 04/05/22 1245 04/06/22 0541 04/08/22 0504  NA 137 139 138  K 3.5 3.5 3.3*  CL 101 103 102  CO2 23 27 27   GLUCOSE 108* 100* 107*  BUN 10 8 6*  CREATININE 0.77 0.66 0.77  CALCIUM 9.9 9.6 9.5   GFR: Estimated Creatinine Clearance: 75 mL/min (by C-G formula based on SCr of 0.77 mg/dL). Liver Function Tests: No results for input(s): "AST", "ALT", "ALKPHOS", "BILITOT", "PROT", "ALBUMIN" in the last 168 hours. No results for input(s): "LIPASE", "AMYLASE" in the last 168 hours. No results for input(s): "AMMONIA" in the last 168 hours. Coagulation Profile: No results for input(s): "INR", "PROTIME" in the last 168 hours. Cardiac  Enzymes: No results for input(s): "CKTOTAL", "CKMB", "CKMBINDEX", "TROPONINI" in the last 168 hours. BNP (last 3 results) No results for input(s): "PROBNP" in the last 8760 hours. HbA1C: No results for input(s): "HGBA1C" in the last 72 hours. CBG: Recent Labs  Lab 04/05/22 1201 04/05/22 1540  GLUCAP 109* 102*   Lipid Profile: No results for input(s): "CHOL", "HDL", "LDLCALC", "TRIG", "CHOLHDL", "LDLDIRECT" in the last 72 hours. Thyroid Function Tests: No results for input(s): "TSH", "T4TOTAL", "FREET4", "T3FREE", "THYROIDAB" in the last 72 hours. Anemia Panel: No results for input(s): "VITAMINB12", "FOLATE", "FERRITIN", "TIBC", "IRON", "RETICCTPCT" in the last 72 hours. Sepsis Labs: No results for input(s): "PROCALCITON", "LATICACIDVEN" in the last 168 hours.  No results found for this or any previous visit (from the past 240 hour(s)).       Radiology Studies: No results found.      Scheduled Meds:  enoxaparin (LOVENOX) injection  40 mg Subcutaneous Q24H   midodrine  10 mg Oral TID WC   pantoprazole  40 mg Oral Q0600   senna-docusate  1 tablet Oral Daily   Continuous Infusions:  sodium chloride       LOS: 3 days    Time spent: 49 minutes spent on chart review, discussion with nursing staff, consultants, updating family and interview/physical exam; more  than 50% of that time was spent in counseling and/or coordination of care.    Alvira Philips [place], DO [facility] Available via Epic secure chat 7am-7pm After these hours, please refer to coverage provider listed on [email or web address] 04/10/2022, 3:51 PM

## 2022-04-10 NOTE — Plan of Care (Signed)
Patient is awake in bed, in no apparent distress, needs met at this time

## 2022-04-11 DIAGNOSIS — R55 Syncope and collapse: Secondary | ICD-10-CM | POA: Diagnosis not present

## 2022-04-11 DIAGNOSIS — E860 Dehydration: Secondary | ICD-10-CM | POA: Diagnosis not present

## 2022-04-11 DIAGNOSIS — K219 Gastro-esophageal reflux disease without esophagitis: Secondary | ICD-10-CM | POA: Diagnosis not present

## 2022-04-11 DIAGNOSIS — E785 Hyperlipidemia, unspecified: Secondary | ICD-10-CM | POA: Diagnosis not present

## 2022-04-11 LAB — BASIC METABOLIC PANEL
Anion gap: 8 (ref 5–15)
BUN: 8 mg/dL (ref 8–23)
CO2: 24 mmol/L (ref 22–32)
Calcium: 9.5 mg/dL (ref 8.9–10.3)
Chloride: 107 mmol/L (ref 98–111)
Creatinine, Ser: 0.81 mg/dL (ref 0.61–1.24)
GFR, Estimated: >60 mL/min (ref >60)
Glucose, Bld: 97 mg/dL (ref 70–99)
Potassium: 3.5 mmol/L (ref 3.5–5.1)
Sodium: 139 mmol/L (ref 135–145)

## 2022-04-11 LAB — MAGNESIUM: Magnesium: 1.5 mg/dL — ABNORMAL LOW (ref 1.7–2.4)

## 2022-04-11 MED ORDER — POTASSIUM CHLORIDE 20 MEQ PO PACK
60.0000 meq | PACK | Freq: Once | ORAL | Status: AC
  Administered 2022-04-11: 60 meq via ORAL
  Filled 2022-04-11: qty 3

## 2022-04-11 MED ORDER — MIDODRINE HCL 10 MG PO TABS: 10.0000 mg | ORAL_TABLET | Freq: Three times a day (TID) | ORAL | 2 refills | Status: AC

## 2022-04-11 MED ORDER — MAGNESIUM SULFATE 4 GM/100ML IV SOLN
4.0000 g | Freq: Once | INTRAVENOUS | Status: AC
  Administered 2022-04-11: 4 g via INTRAVENOUS
  Filled 2022-04-11: qty 100

## 2022-04-11 NOTE — Discharge Summary (Signed)
Physician Discharge Summary  Jeremy Hayden DCV:[id number] DOB: June 23, 1956 DOA: 04/05/2022  PCP: [facility]  Admit date: 04/05/2022 Discharge date: 04/11/2022  Admitted From: Home Disposition: Home  Recommendations for Outpatient Follow-up:  Follow up with PCP in 1-2 weeks Follow-up with cardiology, Dr. Harl Bowie as scheduled on 04/24/2022 Follow-up with neurology, Dr. Dianna Rossetti as scheduled on 05/22/2022. Discontinued amlodipine and started on midodrine for orthostatic hypotension Continue to encourage use of abdominal binder and compression stockings Recommend repeat orthostatic vital signs at next PCP/specialist visit   Home Health: No Equipment/Devices: None none  Discharge Condition: Stable CODE STATUS: Full code Diet recommendation: Heart healthy diet  History of present illness:  Jeremy Hayden is a 66 y.o. male with past medical history significant for essential hypertension, GERD who presented to [facility] ED on 9/22 by direction of the urgent care center for recurrent syncopal episodes.  Patient reports onset since June of this year, roughly now 5 episodes.  The fourth episode happened at a friend's house and he was reportedly coming out of the bathroom in which she felt dizzy with unbalanced on his feet and subsequent passed out for few minutes that was witnessed by his friend.  Most recent episode was the night prior to admission as he remembers being in the kitchen and he passed out.  He initially went to urgent care and was asked to come to the ED for further evaluation.  He denied any prodromal symptoms.  He denies chest pain, no shortness of breath, no nausea/vomiting, no abdominal pain, no rectal bleeding, no urinary symptoms, no paresthesias, no fever, no headache.   In the ED, temperature 97.8 F, HR 86, RR 16, BP 134/90, SPO2 99% on room air.  WBC 4.9, hemoglobin 14.0, platelets 152.  Sodium 137, potassium 3.5, chloride 101, CO2 23, glucose 108, BUN 10, creatinine  0.77.  Urinalysis unrevealing.  CT head without contrast with no acute intracranial abnormality.  MR brain without contrast with no acute intracranial Hilda Blades, age-related cerebral atrophy with chronic microvascular ischemic disease.  EKG with NSR, borderline T wave abnormalities anterior leads, no significant change from previous tracing.  Patient was started on IV fluids.  EDP consulted [facility] for further evaluation and management of recurrent syncopal episodes.  Hospital course:  Syncope, recurrent Orthostatic hypotension/autonomic dysfunction Patient presenting to ED with recurrent syncopal episodes.  Patient was notably orthostatic in the setting of antihypertensive use outpatient with amlodipine. Patient was given IV fluid hydration.  CT head/MRI brain unrevealing.  TTE with LVEF 75-79%, grade 1 diastolic dysfunction, RV systolic function normal, IVC normal in size.  Patient was seen by cardiology, patient possesses normal AV conduction without aortic stenosis.  Recommended initiation of midodrine, compression stockings and abdominal binder with outpatient follow-up.  Outpatient follow-up with PCP 1 week.  Has scheduled outpatient follow-up with cardiology and neurology.  Recommend repeat orthostatic vital signs and next PCP/specialist visit.   Hypokalemia Hypomagnesemia Repleted during hospitalization.   History of essential hypertension Discontinued home amlodipine   GERD: Continue PPI  Discharge Diagnoses:  Principal Problem:   Syncope Active Problems:   Hyperlipidemia   Hypertension   GERD   Dehydration   Paroxysmal atrial fibrillation [facility])    Discharge Instructions  Discharge Instructions     Call MD for:  difficulty breathing, headache or visual disturbances   Complete by: As directed    Call MD for:  extreme fatigue   Complete by: As directed    Call MD for:  persistant dizziness or  Physician Discharge Summary  Jeremy Hayden DCV:[id number] DOB: June 23, 1956 DOA: 04/05/2022  PCP: [facility]  Admit date: 04/05/2022 Discharge date: 04/11/2022  Admitted From: Home Disposition: Home  Recommendations for Outpatient Follow-up:  Follow up with PCP in 1-2 weeks Follow-up with cardiology, Dr. Harl Bowie as scheduled on 04/24/2022 Follow-up with neurology, Dr. Dianna Rossetti as scheduled on 05/22/2022. Discontinued amlodipine and started on midodrine for orthostatic hypotension Continue to encourage use of abdominal binder and compression stockings Recommend repeat orthostatic vital signs at next PCP/specialist visit   Home Health: No Equipment/Devices: None none  Discharge Condition: Stable CODE STATUS: Full code Diet recommendation: Heart healthy diet  History of present illness:  Jeremy Hayden is a 66 y.o. male with past medical history significant for essential hypertension, GERD who presented to [facility] ED on 9/22 by direction of the urgent care center for recurrent syncopal episodes.  Patient reports onset since June of this year, roughly now 5 episodes.  The fourth episode happened at a friend's house and he was reportedly coming out of the bathroom in which she felt dizzy with unbalanced on his feet and subsequent passed out for few minutes that was witnessed by his friend.  Most recent episode was the night prior to admission as he remembers being in the kitchen and he passed out.  He initially went to urgent care and was asked to come to the ED for further evaluation.  He denied any prodromal symptoms.  He denies chest pain, no shortness of breath, no nausea/vomiting, no abdominal pain, no rectal bleeding, no urinary symptoms, no paresthesias, no fever, no headache.   In the ED, temperature 97.8 F, HR 86, RR 16, BP 134/90, SPO2 99% on room air.  WBC 4.9, hemoglobin 14.0, platelets 152.  Sodium 137, potassium 3.5, chloride 101, CO2 23, glucose 108, BUN 10, creatinine  0.77.  Urinalysis unrevealing.  CT head without contrast with no acute intracranial abnormality.  MR brain without contrast with no acute intracranial Hilda Blades, age-related cerebral atrophy with chronic microvascular ischemic disease.  EKG with NSR, borderline T wave abnormalities anterior leads, no significant change from previous tracing.  Patient was started on IV fluids.  EDP consulted [facility] for further evaluation and management of recurrent syncopal episodes.  Hospital course:  Syncope, recurrent Orthostatic hypotension/autonomic dysfunction Patient presenting to ED with recurrent syncopal episodes.  Patient was notably orthostatic in the setting of antihypertensive use outpatient with amlodipine. Patient was given IV fluid hydration.  CT head/MRI brain unrevealing.  TTE with LVEF 75-79%, grade 1 diastolic dysfunction, RV systolic function normal, IVC normal in size.  Patient was seen by cardiology, patient possesses normal AV conduction without aortic stenosis.  Recommended initiation of midodrine, compression stockings and abdominal binder with outpatient follow-up.  Outpatient follow-up with PCP 1 week.  Has scheduled outpatient follow-up with cardiology and neurology.  Recommend repeat orthostatic vital signs and next PCP/specialist visit.   Hypokalemia Hypomagnesemia Repleted during hospitalization.   History of essential hypertension Discontinued home amlodipine   GERD: Continue PPI  Discharge Diagnoses:  Principal Problem:   Syncope Active Problems:   Hyperlipidemia   Hypertension   GERD   Dehydration   Paroxysmal atrial fibrillation [facility])    Discharge Instructions  Discharge Instructions     Call MD for:  difficulty breathing, headache or visual disturbances   Complete by: As directed    Call MD for:  extreme fatigue   Complete by: As directed    Call MD for:  persistant dizziness or  Physician Discharge Summary  Jeremy Hayden DCV:[id number] DOB: June 23, 1956 DOA: 04/05/2022  PCP: [facility]  Admit date: 04/05/2022 Discharge date: 04/11/2022  Admitted From: Home Disposition: Home  Recommendations for Outpatient Follow-up:  Follow up with PCP in 1-2 weeks Follow-up with cardiology, Dr. Harl Bowie as scheduled on 04/24/2022 Follow-up with neurology, Dr. Dianna Rossetti as scheduled on 05/22/2022. Discontinued amlodipine and started on midodrine for orthostatic hypotension Continue to encourage use of abdominal binder and compression stockings Recommend repeat orthostatic vital signs at next PCP/specialist visit   Home Health: No Equipment/Devices: None none  Discharge Condition: Stable CODE STATUS: Full code Diet recommendation: Heart healthy diet  History of present illness:  Jeremy Hayden is a 66 y.o. male with past medical history significant for essential hypertension, GERD who presented to [facility] ED on 9/22 by direction of the urgent care center for recurrent syncopal episodes.  Patient reports onset since June of this year, roughly now 5 episodes.  The fourth episode happened at a friend's house and he was reportedly coming out of the bathroom in which she felt dizzy with unbalanced on his feet and subsequent passed out for few minutes that was witnessed by his friend.  Most recent episode was the night prior to admission as he remembers being in the kitchen and he passed out.  He initially went to urgent care and was asked to come to the ED for further evaluation.  He denied any prodromal symptoms.  He denies chest pain, no shortness of breath, no nausea/vomiting, no abdominal pain, no rectal bleeding, no urinary symptoms, no paresthesias, no fever, no headache.   In the ED, temperature 97.8 F, HR 86, RR 16, BP 134/90, SPO2 99% on room air.  WBC 4.9, hemoglobin 14.0, platelets 152.  Sodium 137, potassium 3.5, chloride 101, CO2 23, glucose 108, BUN 10, creatinine  0.77.  Urinalysis unrevealing.  CT head without contrast with no acute intracranial abnormality.  MR brain without contrast with no acute intracranial Hilda Blades, age-related cerebral atrophy with chronic microvascular ischemic disease.  EKG with NSR, borderline T wave abnormalities anterior leads, no significant change from previous tracing.  Patient was started on IV fluids.  EDP consulted [facility] for further evaluation and management of recurrent syncopal episodes.  Hospital course:  Syncope, recurrent Orthostatic hypotension/autonomic dysfunction Patient presenting to ED with recurrent syncopal episodes.  Patient was notably orthostatic in the setting of antihypertensive use outpatient with amlodipine. Patient was given IV fluid hydration.  CT head/MRI brain unrevealing.  TTE with LVEF 75-79%, grade 1 diastolic dysfunction, RV systolic function normal, IVC normal in size.  Patient was seen by cardiology, patient possesses normal AV conduction without aortic stenosis.  Recommended initiation of midodrine, compression stockings and abdominal binder with outpatient follow-up.  Outpatient follow-up with PCP 1 week.  Has scheduled outpatient follow-up with cardiology and neurology.  Recommend repeat orthostatic vital signs and next PCP/specialist visit.   Hypokalemia Hypomagnesemia Repleted during hospitalization.   History of essential hypertension Discontinued home amlodipine   GERD: Continue PPI  Discharge Diagnoses:  Principal Problem:   Syncope Active Problems:   Hyperlipidemia   Hypertension   GERD   Dehydration   Paroxysmal atrial fibrillation [facility])    Discharge Instructions  Discharge Instructions     Call MD for:  difficulty breathing, headache or visual disturbances   Complete by: As directed    Call MD for:  extreme fatigue   Complete by: As directed    Call MD for:  persistant dizziness or  [address] [place] [address] [phone]         Janina Mayo, MD. Go on 04/24/2022.   Specialty: Cardiology Contact information: [address] [place] [address] [phone]                No Known Allergies  Consultations: Cardiology, Dr. Harl Bowie   Procedures/Studies: ECHOCARDIOGRAM COMPLETE  Result Date: 04/06/2022    ECHOCARDIOGRAM REPORT   Patient Name:   JAHBARI REPINSKI Date of Exam: 04/06/2022 Medical Rec #:  [id number]       Height:       64.0 in Accession #:    [id number]      Weight:       138.0 lb Date of Birth:  April 22, 1956        BSA:          1.671 m Patient Age:    60 years        BP:           138/87 mmHg Patient Gender: M               HR:           83 bpm. Exam Location:  Inpatient Procedure: 2D Echo Indications:    syncope  History:        Patient has prior history of Echocardiogram examinations, most                  recent 03/27/2015. Arrythmias:Atrial Fibrillation,                 Signs/Symptoms:Chest Pain and Syncope; Risk Factors:Hypertension                 and Dyslipidemia.  Sonographer:    Harvie Junior Referring Phys: [id number]  1. Left ventricular ejection fraction, by estimation, is 60 to 65%. The left ventricle has normal function. The left ventricle has no regional wall motion abnormalities. Left ventricular diastolic parameters are consistent with Grade I diastolic dysfunction (impaired relaxation).  2. Right ventricular systolic function is normal. The right ventricular size is normal. There is normal pulmonary artery systolic pressure.  3. The mitral valve is normal in structure. Trivial mitral valve regurgitation. No evidence of mitral stenosis.  4. The aortic valve is tricuspid. Aortic valve regurgitation is not visualized. No aortic stenosis is present.  5. The inferior vena cava is normal in size with greater than 50% respiratory variability, suggesting right atrial pressure of 3 mmHg. FINDINGS  Left Ventricle: Left ventricular ejection fraction, by estimation, is 60 to 65%. The left ventricle has normal function. The left ventricle has no regional wall motion abnormalities. The left ventricular internal cavity size was normal in size. There is  no left ventricular hypertrophy. Left ventricular diastolic parameters are consistent with Grade I diastolic dysfunction (impaired relaxation). Right Ventricle: The right ventricular size is normal. No increase in right ventricular wall thickness. Right ventricular systolic function is normal. There is normal pulmonary artery systolic pressure. The tricuspid regurgitant velocity is 2.36 m/s, and  with an assumed right atrial pressure of 3 mmHg, the estimated right ventricular systolic pressure is 72.5 mmHg. Left Atrium: Left atrial size was normal in size. Right Atrium: Right atrial size was normal in size. Pericardium: There is no evidence of  pericardial effusion. Mitral Valve: The mitral valve is normal in structure. Trivial mitral valve regurgitation. No evidence of mitral valve stenosis. Tricuspid Valve: The tricuspid valve  [address] [place] [address] [phone]         Janina Mayo, MD. Go on 04/24/2022.   Specialty: Cardiology Contact information: [address] [place] [address] [phone]                No Known Allergies  Consultations: Cardiology, Dr. Harl Bowie   Procedures/Studies: ECHOCARDIOGRAM COMPLETE  Result Date: 04/06/2022    ECHOCARDIOGRAM REPORT   Patient Name:   JAHBARI REPINSKI Date of Exam: 04/06/2022 Medical Rec #:  [id number]       Height:       64.0 in Accession #:    [id number]      Weight:       138.0 lb Date of Birth:  April 22, 1956        BSA:          1.671 m Patient Age:    60 years        BP:           138/87 mmHg Patient Gender: M               HR:           83 bpm. Exam Location:  Inpatient Procedure: 2D Echo Indications:    syncope  History:        Patient has prior history of Echocardiogram examinations, most                  recent 03/27/2015. Arrythmias:Atrial Fibrillation,                 Signs/Symptoms:Chest Pain and Syncope; Risk Factors:Hypertension                 and Dyslipidemia.  Sonographer:    Harvie Junior Referring Phys: [id number]  1. Left ventricular ejection fraction, by estimation, is 60 to 65%. The left ventricle has normal function. The left ventricle has no regional wall motion abnormalities. Left ventricular diastolic parameters are consistent with Grade I diastolic dysfunction (impaired relaxation).  2. Right ventricular systolic function is normal. The right ventricular size is normal. There is normal pulmonary artery systolic pressure.  3. The mitral valve is normal in structure. Trivial mitral valve regurgitation. No evidence of mitral stenosis.  4. The aortic valve is tricuspid. Aortic valve regurgitation is not visualized. No aortic stenosis is present.  5. The inferior vena cava is normal in size with greater than 50% respiratory variability, suggesting right atrial pressure of 3 mmHg. FINDINGS  Left Ventricle: Left ventricular ejection fraction, by estimation, is 60 to 65%. The left ventricle has normal function. The left ventricle has no regional wall motion abnormalities. The left ventricular internal cavity size was normal in size. There is  no left ventricular hypertrophy. Left ventricular diastolic parameters are consistent with Grade I diastolic dysfunction (impaired relaxation). Right Ventricle: The right ventricular size is normal. No increase in right ventricular wall thickness. Right ventricular systolic function is normal. There is normal pulmonary artery systolic pressure. The tricuspid regurgitant velocity is 2.36 m/s, and  with an assumed right atrial pressure of 3 mmHg, the estimated right ventricular systolic pressure is 72.5 mmHg. Left Atrium: Left atrial size was normal in size. Right Atrium: Right atrial size was normal in size. Pericardium: There is no evidence of  pericardial effusion. Mitral Valve: The mitral valve is normal in structure. Trivial mitral valve regurgitation. No evidence of mitral valve stenosis. Tricuspid Valve: The tricuspid valve  [address] [place] [address] [phone]         Janina Mayo, MD. Go on 04/24/2022.   Specialty: Cardiology Contact information: [address] [place] [address] [phone]                No Known Allergies  Consultations: Cardiology, Dr. Harl Bowie   Procedures/Studies: ECHOCARDIOGRAM COMPLETE  Result Date: 04/06/2022    ECHOCARDIOGRAM REPORT   Patient Name:   JAHBARI REPINSKI Date of Exam: 04/06/2022 Medical Rec #:  [id number]       Height:       64.0 in Accession #:    [id number]      Weight:       138.0 lb Date of Birth:  April 22, 1956        BSA:          1.671 m Patient Age:    60 years        BP:           138/87 mmHg Patient Gender: M               HR:           83 bpm. Exam Location:  Inpatient Procedure: 2D Echo Indications:    syncope  History:        Patient has prior history of Echocardiogram examinations, most                  recent 03/27/2015. Arrythmias:Atrial Fibrillation,                 Signs/Symptoms:Chest Pain and Syncope; Risk Factors:Hypertension                 and Dyslipidemia.  Sonographer:    Harvie Junior Referring Phys: [id number]  1. Left ventricular ejection fraction, by estimation, is 60 to 65%. The left ventricle has normal function. The left ventricle has no regional wall motion abnormalities. Left ventricular diastolic parameters are consistent with Grade I diastolic dysfunction (impaired relaxation).  2. Right ventricular systolic function is normal. The right ventricular size is normal. There is normal pulmonary artery systolic pressure.  3. The mitral valve is normal in structure. Trivial mitral valve regurgitation. No evidence of mitral stenosis.  4. The aortic valve is tricuspid. Aortic valve regurgitation is not visualized. No aortic stenosis is present.  5. The inferior vena cava is normal in size with greater than 50% respiratory variability, suggesting right atrial pressure of 3 mmHg. FINDINGS  Left Ventricle: Left ventricular ejection fraction, by estimation, is 60 to 65%. The left ventricle has normal function. The left ventricle has no regional wall motion abnormalities. The left ventricular internal cavity size was normal in size. There is  no left ventricular hypertrophy. Left ventricular diastolic parameters are consistent with Grade I diastolic dysfunction (impaired relaxation). Right Ventricle: The right ventricular size is normal. No increase in right ventricular wall thickness. Right ventricular systolic function is normal. There is normal pulmonary artery systolic pressure. The tricuspid regurgitant velocity is 2.36 m/s, and  with an assumed right atrial pressure of 3 mmHg, the estimated right ventricular systolic pressure is 72.5 mmHg. Left Atrium: Left atrial size was normal in size. Right Atrium: Right atrial size was normal in size. Pericardium: There is no evidence of  pericardial effusion. Mitral Valve: The mitral valve is normal in structure. Trivial mitral valve regurgitation. No evidence of mitral valve stenosis. Tricuspid Valve: The tricuspid valve  [address] [place] [address] [phone]         Janina Mayo, MD. Go on 04/24/2022.   Specialty: Cardiology Contact information: [address] [place] [address] [phone]                No Known Allergies  Consultations: Cardiology, Dr. Harl Bowie   Procedures/Studies: ECHOCARDIOGRAM COMPLETE  Result Date: 04/06/2022    ECHOCARDIOGRAM REPORT   Patient Name:   JAHBARI REPINSKI Date of Exam: 04/06/2022 Medical Rec #:  [id number]       Height:       64.0 in Accession #:    [id number]      Weight:       138.0 lb Date of Birth:  April 22, 1956        BSA:          1.671 m Patient Age:    60 years        BP:           138/87 mmHg Patient Gender: M               HR:           83 bpm. Exam Location:  Inpatient Procedure: 2D Echo Indications:    syncope  History:        Patient has prior history of Echocardiogram examinations, most                  recent 03/27/2015. Arrythmias:Atrial Fibrillation,                 Signs/Symptoms:Chest Pain and Syncope; Risk Factors:Hypertension                 and Dyslipidemia.  Sonographer:    Harvie Junior Referring Phys: [id number]  1. Left ventricular ejection fraction, by estimation, is 60 to 65%. The left ventricle has normal function. The left ventricle has no regional wall motion abnormalities. Left ventricular diastolic parameters are consistent with Grade I diastolic dysfunction (impaired relaxation).  2. Right ventricular systolic function is normal. The right ventricular size is normal. There is normal pulmonary artery systolic pressure.  3. The mitral valve is normal in structure. Trivial mitral valve regurgitation. No evidence of mitral stenosis.  4. The aortic valve is tricuspid. Aortic valve regurgitation is not visualized. No aortic stenosis is present.  5. The inferior vena cava is normal in size with greater than 50% respiratory variability, suggesting right atrial pressure of 3 mmHg. FINDINGS  Left Ventricle: Left ventricular ejection fraction, by estimation, is 60 to 65%. The left ventricle has normal function. The left ventricle has no regional wall motion abnormalities. The left ventricular internal cavity size was normal in size. There is  no left ventricular hypertrophy. Left ventricular diastolic parameters are consistent with Grade I diastolic dysfunction (impaired relaxation). Right Ventricle: The right ventricular size is normal. No increase in right ventricular wall thickness. Right ventricular systolic function is normal. There is normal pulmonary artery systolic pressure. The tricuspid regurgitant velocity is 2.36 m/s, and  with an assumed right atrial pressure of 3 mmHg, the estimated right ventricular systolic pressure is 72.5 mmHg. Left Atrium: Left atrial size was normal in size. Right Atrium: Right atrial size was normal in size. Pericardium: There is no evidence of  pericardial effusion. Mitral Valve: The mitral valve is normal in structure. Trivial mitral valve regurgitation. No evidence of mitral valve stenosis. Tricuspid Valve: The tricuspid valve

## 2022-04-24 ENCOUNTER — Ambulatory Visit: Payer: No Typology Code available for payment source | Attending: Internal Medicine | Admitting: Internal Medicine

## 2022-04-24 NOTE — Progress Notes (Deleted)
Cardiology Office Note:    Date:  04/24/2022   ID:  Jeremy Hayden, DOB [birth year], MRN [id number]  PCP:  Clinic, [facility] Providers Cardiologist:  None { Click to update primary MD,subspecialty MD or APP then REFRESH:1}    Referring MD: Clinic, Jeremy Hayden   No chief complaint on file. Hospital FU  History of Present Illness:    Jeremy Hayden is a 66 y.o. male with a hx of HTN, GERD, presented to the ED for syncope. I saw him at this time in September. Noted episodes since June 2023. Per my HPI: He notes these occurred back to 2013 with a negative w/u. He states he will walk and then fall without a prodrome. He then comes to. He had a cut on his head recently. His orthostatics were + here. Sitting 115/84 mmHg HR 89, standing 84/65 mmHg HR 92 bpm. He has no AVN block here.  Echo shows normal LV function. No aortic stenosis. Was concerned for autonomic dysfunction. I recommended IV fluids and midodrine 2.5 mg TID. Also recommended, compression stocking and an abdominal binder.  He is here for follow-up.  Past Medical History:  Diagnosis Date   Cancer [facility])    prostate   GERD (gastroesophageal reflux disease)    Hypertension     Past Surgical History:  Procedure Laterality Date   ESOPHAGOGASTRODUODENOSCOPY N/A 03/27/2015   Procedure: ESOPHAGOGASTRODUODENOSCOPY (EGD);  Surgeon: Ruffin Frederick, MD;  Location: [facility] ENDOSCOPY;  Service: Gastroenterology;  Laterality: N/A;   NECK SURGERY  11/2001   multilevel c spine diskectomy, decompression, arthrodesis with bone graft and plate placed.  Dr Sharolyn Douglas   PROSTATE SURGERY     ROTATOR CUFF REPAIR Right 2003   with partial acromiectomy/plasty    Current Medications: No outpatient medications have been marked as taking for the 04/24/22 encounter (Appointment) with Maisie Fus, MD.     Allergies:   Patient has no known allergies.   Social History   Socioeconomic History   Marital  status: Legally Separated    Spouse name: Not on file   Number of children: Not on file   Years of education: Not on file   Highest education level: Not on file  Occupational History   Not on file  Tobacco Use   Smoking status: Never   Smokeless tobacco: Never  Vaping Use   Vaping Use: Never used  Substance and Sexual Activity   Alcohol use: Yes    Alcohol/week: 24.0 standard drinks of alcohol    Types: 24 Cans of beer per week    Comment: "a little bit...no liquor"   Drug use: No   Sexual activity: Not on file  Other Topics Concern   Not on file  Social History Narrative   As of September, 2016 the patient works for a [employer]. 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 It is not particularly exertional work but does require some physical strength.   Patient has been separated from his wife since 87.   Social Determinants of Health   Financial Resource Strain: Not on file  Food Insecurity: No Food Insecurity (04/05/2022)   Hunger Vital Sign    Worried About Running Out of Food in the Last Year: Never true    Ran Out of Food in the Last Year: Never true  Transportation Needs: Unmet Transportation Needs (04/08/2022)   PRAPARE - Transportation    Lack  of Transportation (Medical): Yes    Lack of Transportation (Non-Medical): No  Physical Activity: Not on file  Stress: Not on file  Social Connections: Not on file     Family History: The patient's ***family history includes Brain cancer in his sister; Cancer - Colon in his father; Ovarian cancer in his mother; Prostate cancer in his father; Sickle cell anemia in his brother.  ROS:   Please see the history of present illness.    *** All other systems reviewed and are negative.  EKGs/Labs/Other Studies Reviewed:    The following studies were reviewed today: ***  EKG:  EKG is *** ordered today.  The ekg ordered today demonstrates ***  Recent Labs: 08/05/2021: ALT  37 04/06/2022: TSH 4.565 04/08/2022: Hemoglobin 13.7; Platelets 191 04/11/2022: BUN 8; Creatinine, Ser 0.81; Magnesium 1.5; Potassium 3.5; Sodium 139  Recent Lipid Panel    Component Value Date/Time   CHOL  12/26/2009 0344    169        ATP III CLASSIFICATION:  <200     mg/dL   Desirable  409-811  mg/dL   Borderline High  >=914    mg/dL   High          TRIG 51 12/26/2009 0344   HDL 53 12/26/2009 0344   CHOLHDL 3.2 12/26/2009 0344   VLDL 10 12/26/2009 0344   LDLCALC (H) 12/26/2009 0344    106        Total Cholesterol/HDL:CHD Risk Coronary Heart Disease Risk Table                     Men   Women  1/2 Average Risk   3.4   3.3  Average Risk       5.0   4.4  2 X Average Risk   9.6   7.1  3 X Average Risk  23.4   11.0        Use the calculated Patient Ratio above and the CHD Risk Table to determine the patient's CHD Risk.        ATP III CLASSIFICATION (LDL):  <100     mg/dL   Optimal  782-956  mg/dL   Near or Above                    Optimal  130-159  mg/dL   Borderline  213-086  mg/dL   High  >578     mg/dL   Very High     Risk Assessment/Calculations:   {Does this patient have ATRIAL FIBRILLATION?[phone]  No BP recorded.  {Refresh Note OR Click here to enter BP  :1}***         Physical Exam:    VS:  There were no vitals taken for this visit.    Wt Readings from Last 3 Encounters:  04/05/22 138 lb (62.6 kg)  08/27/21 130 lb (59 kg)  01/18/19 162 lb (73.5 kg)     GEN: *** Well nourished, well developed in no acute distress HEENT: Normal NECK: No JVD; No carotid bruits LYMPHATICS: No lymphadenopathy CARDIAC: ***RRR, no murmurs, rubs, gallops RESPIRATORY:  Clear to auscultation without rales, wheezing or rhonchi  ABDOMEN: Soft, non-tender, non-distended MUSCULOSKELETAL:  No edema; No deformity  SKIN: Warm and dry NEUROLOGIC:  Alert and oriented x 3 PSYCHIATRIC:  Normal affect   ASSESSMENT:    No diagnosis found. PLAN:    In order of problems  listed above:  ***      {Are  you ordering a CV Procedure (e.g. stress test, cath, DCCV, TEE, etc)?   Press F2        :[id number]}    Medication Adjustments/Labs and Tests Ordered: Current medicines are reviewed at length with the patient today.  Concerns regarding medicines are outlined above.  No orders of the defined types were placed in this encounter.  No orders of the defined types were placed in this encounter.   There are no Patient Instructions on file for this visit.   Signed, Maisie Fus, MD  04/24/2022 8:37 AM    [facility]

## 2022-08-04 IMAGING — CT CT HEAD W/O CM
4 series · 14 of 47 positions shown, 16 images · non-contrast
Comparison: CT 10/07/2015

CLINICAL DATA: Head trauma, minor (Age >= 65y); Facial trauma,
blunt; Neck trauma (Age >= 65y)



[Series 3: head without · axial · non-contrast · 0.43mm/px · z∈[-145,-30]mm · 7 of 31 slices shown, 9 images]
[im 4/31  brain]
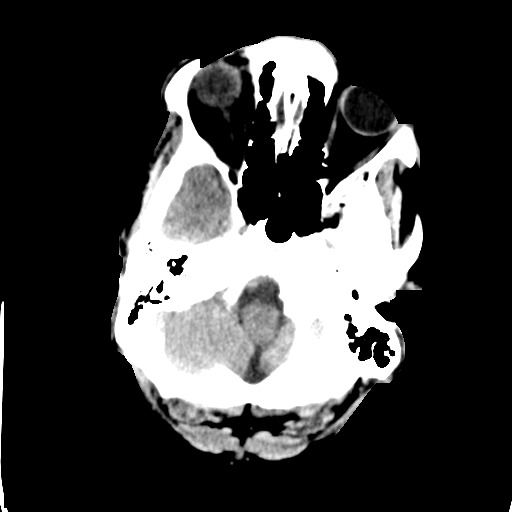
[im 4/31  bone]
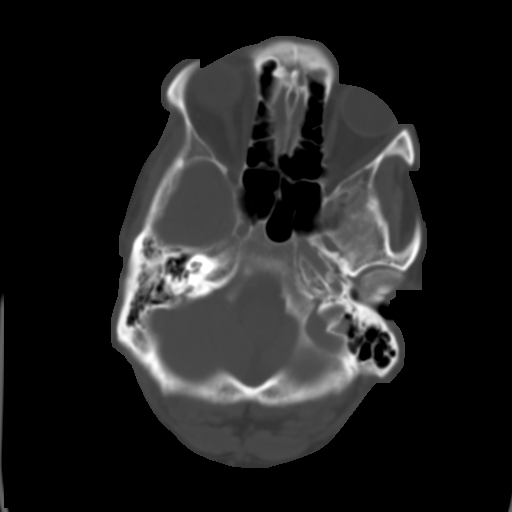
[im 8/31  brain]
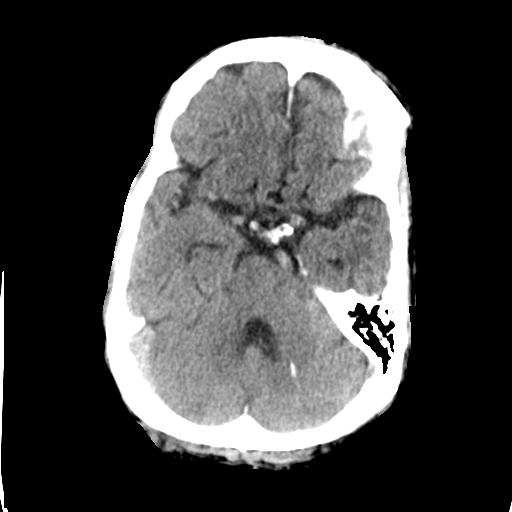
[im 12/31  brain]
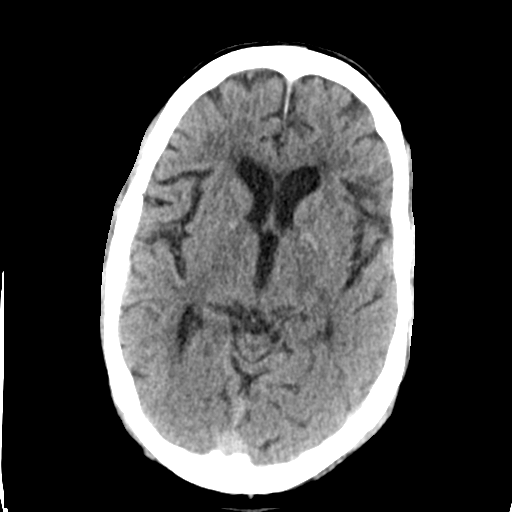
[im 16/31  brain]
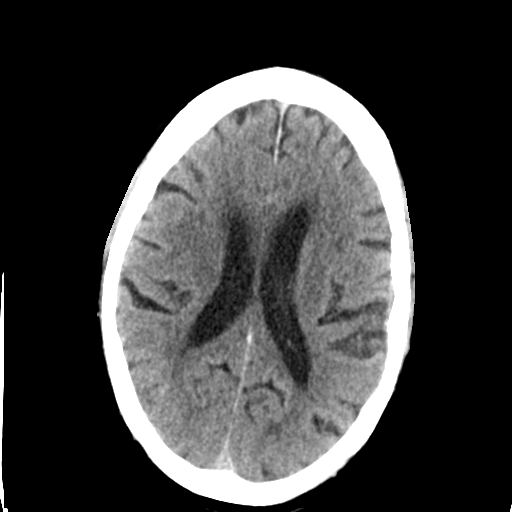
[im 19/31  brain]
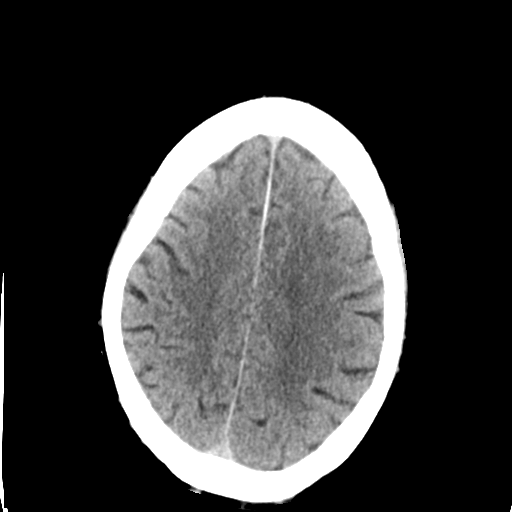
[im 19/31  bone]
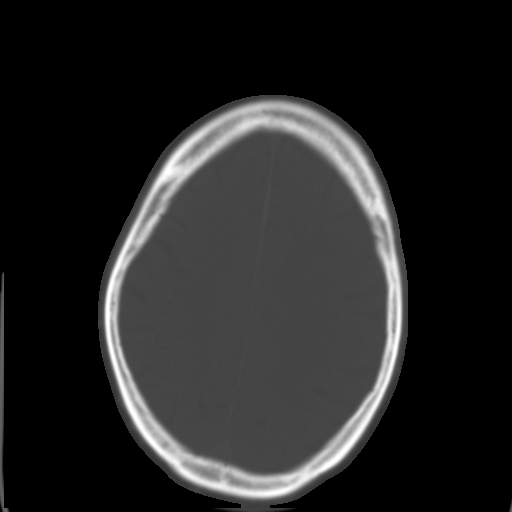
[im 23/31  brain]
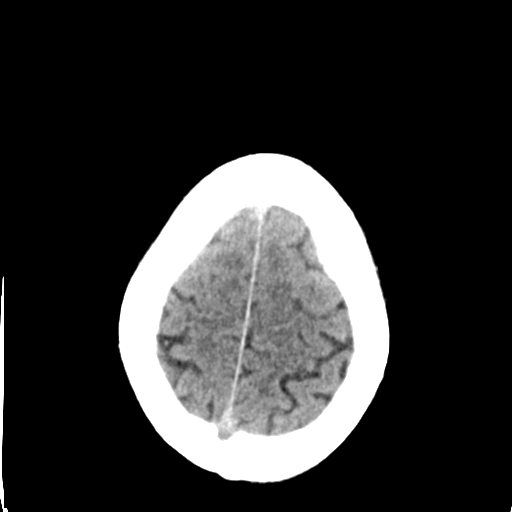
[im 27/31  brain]
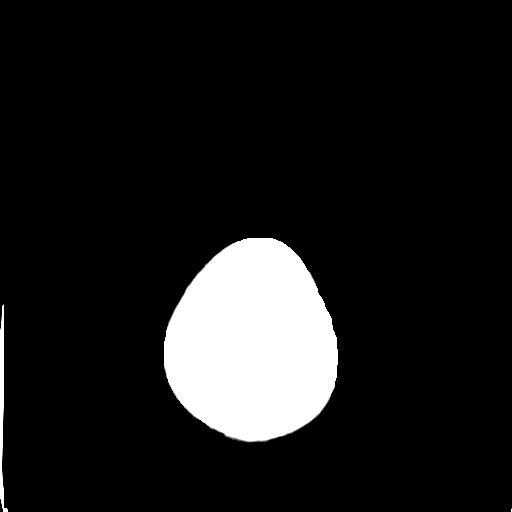

[Series 4: head bone · axial · 0.43mm/px · 1 of 77 slices shown]
[im 8/77  bone]
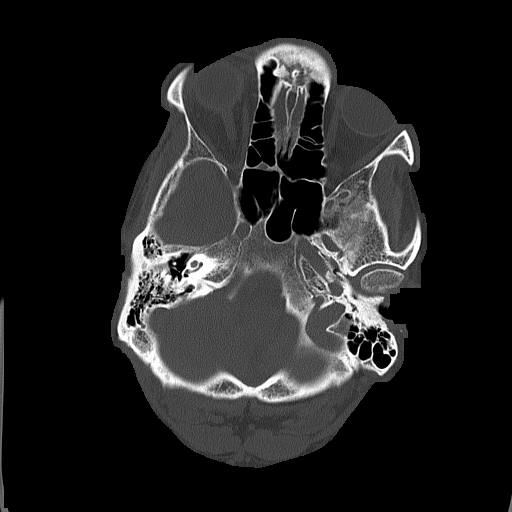

[Series 5: head without cor · coronal · non-contrast · 0.32mm/px · 3 of 74 slices shown]
[im 25/74  brain]
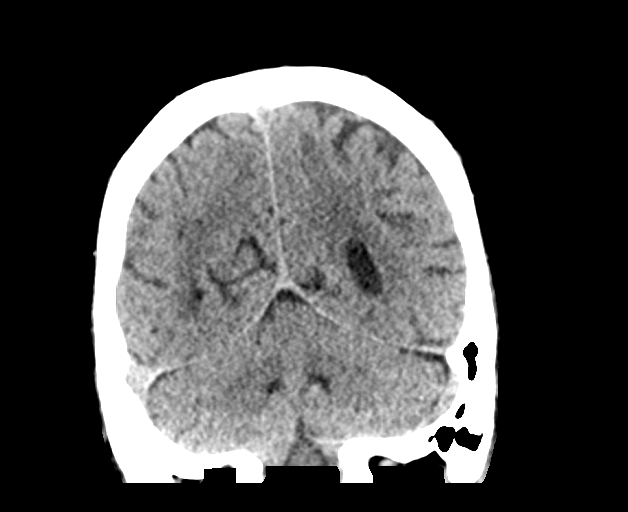
[im 33/74  brain]
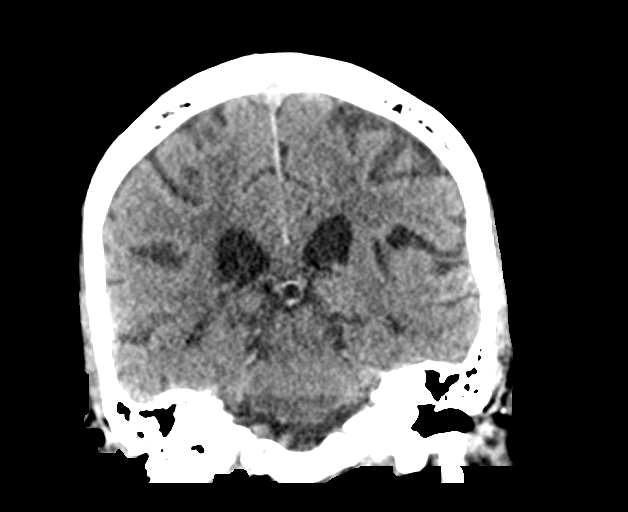
[im 41/74  brain]
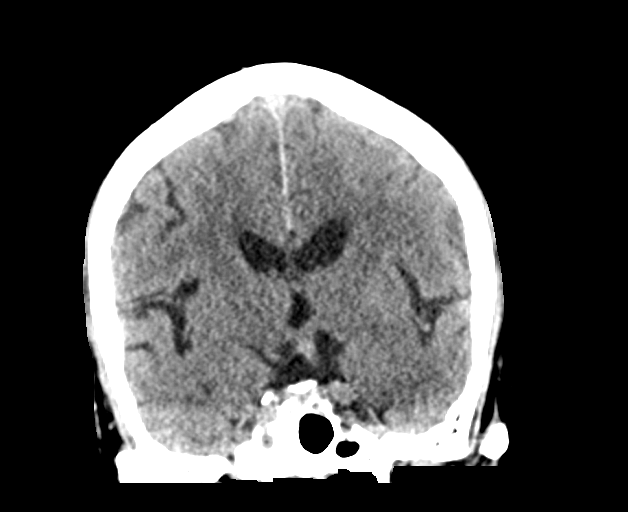

[Series 6: head without sag · sagittal · non-contrast · 0.30mm/px · 3 of 67 slices shown]
[im 23/67  brain]
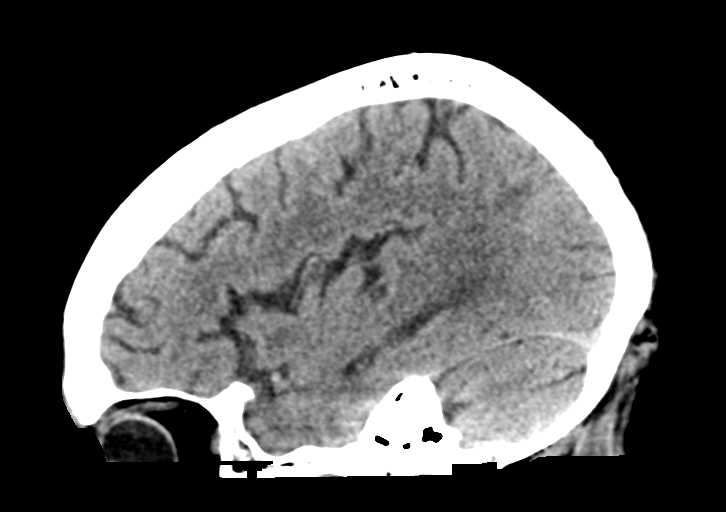
[im 34/67  brain]
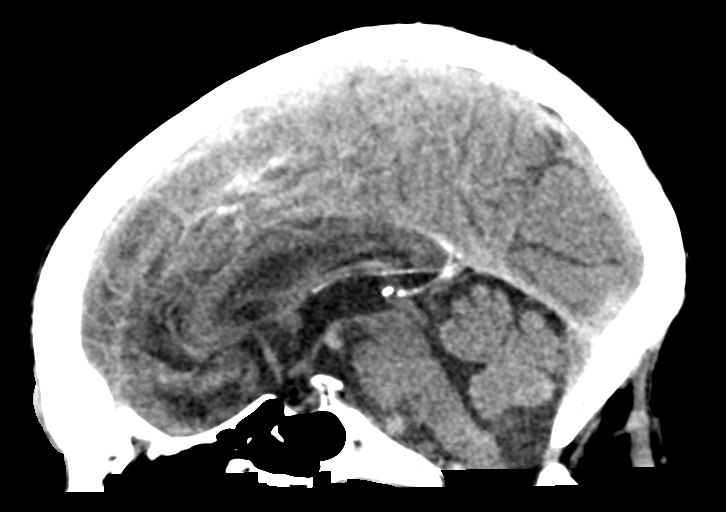
[im 45/67  brain]
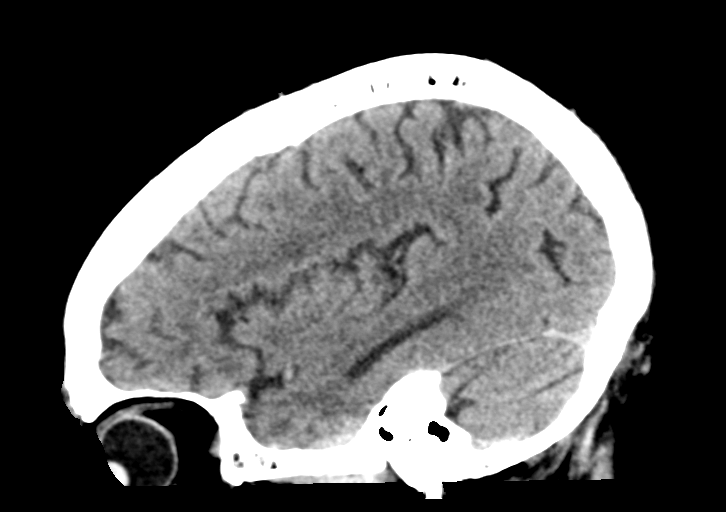

[14 of 47 positions shown; findings below may reference images not displayed]

FINDINGS: CT HEAD FINDINGS

Brain: No evidence of acute intracranial hemorrhage or extra-axial
collection.No evidence of mass lesion/concerning mass effect.The
ventricles are normal in size.Scattered subcortical and
periventricular white matter hypodensities, nonspecific but likely
sequela of chronic small vessel ischemic disease.Unchanged left
intake nucleus calcification.

Vascular: Vascular calcifications.  No hyperdense vessel.

Skull: Negative for skull fracture.

Other: None

CT MAXILLOFACIAL FINDINGS

Osseous: No fracture or mandibular dislocation. No destructive
process.

Orbits: Negative. No traumatic or inflammatory finding.

Sinuses: Minimal ethmoid air cell mucosal thickening. The mastoid
air cells are clear.

Soft tissues: There is focal forehead scalp soft tissue swelling.

CT CERVICAL SPINE FINDINGS

Alignment: Trace degenerative anterolisthesis at C7-T1.

Skull base and vertebrae: Prior C4-C6 ACDF with solid arthrodesis.
Intact hardware without evidence of loosening. There is no acute
cervical spine fracture.

Soft tissues and spinal canal: No prevertebral fluid or swelling. No
visible canal hematoma.

Disc levels: There is mild-to-moderate degenerative disc disease
above and below the ACDF, worst at C7-T1.

Upper chest: Negative.

Other: None
IMPRESSION: No acute intracranial abnormality. Chronic small vessel ischemic
disease.

No acute facial fracture.  Focal forehead soft tissue swelling.

No acute cervical spine fracture. Prior C4-C6 ACDF without evidence
of hardware complication. Mild-to-moderate degenerative disc disease
above below the ACDF, worst at C7-T1.

## 2022-08-04 IMAGING — CT CT CERVICAL SPINE W/O CM
3 series · 14 of 33 positions shown, 17 images · non-contrast
Comparison: CT 10/07/2015

CLINICAL DATA: Head trauma, minor (Age >= 65y); Facial trauma,
blunt; Neck trauma (Age >= 65y)



[Series 8: c_spine 2.0 st · axial · 0.33mm/px · z∈[-291,-171]mm · 6 of 80 slices shown, 8 images]
[im 13/80  soft-tissue]
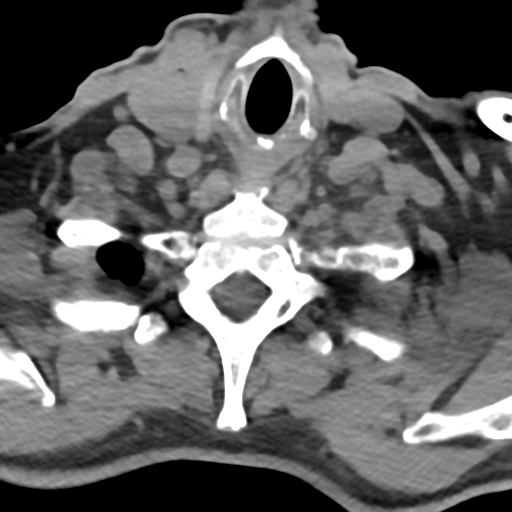
[im 13/80  bone]
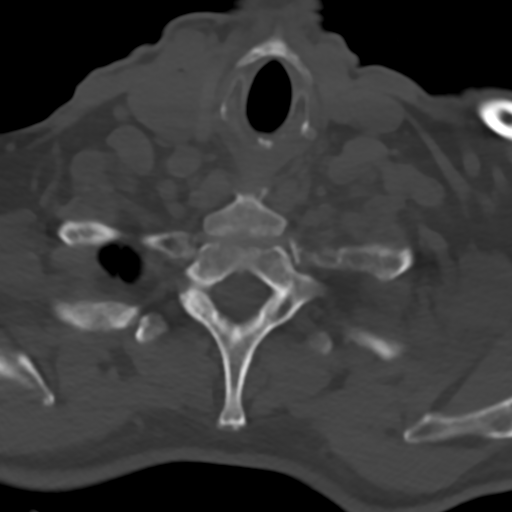
[im 25/80  bone]
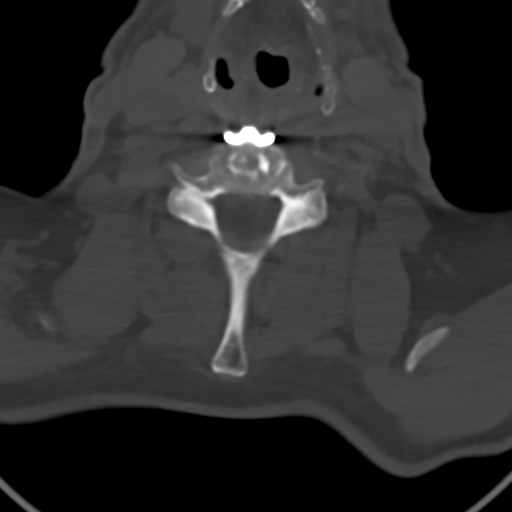
[im 37/80  bone]
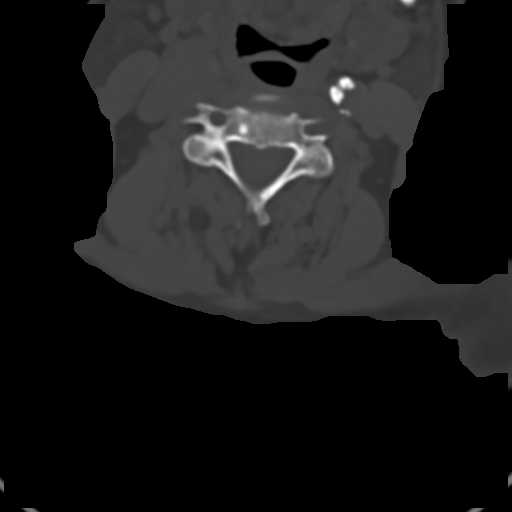
[im 49/80  bone]
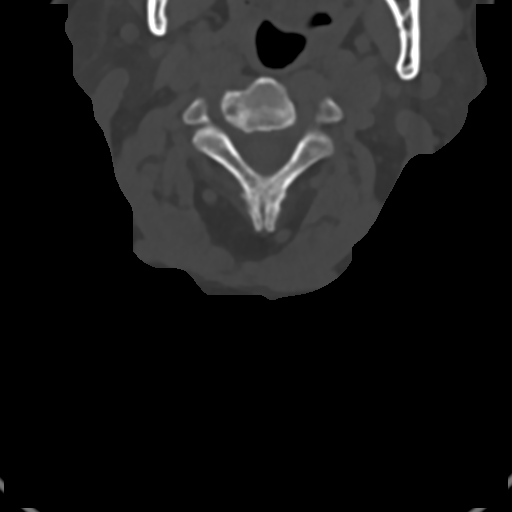
[im 61/80  soft-tissue]
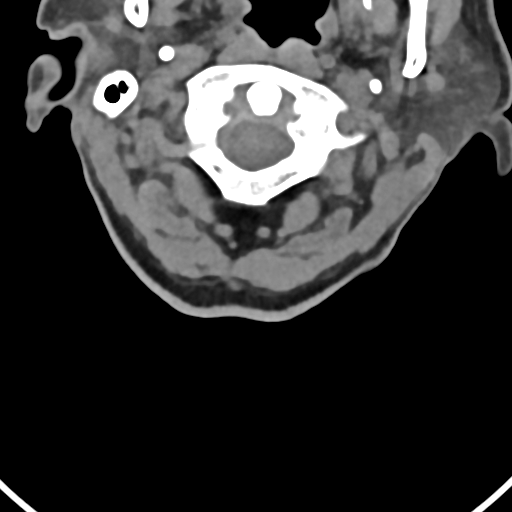
[im 61/80  bone]
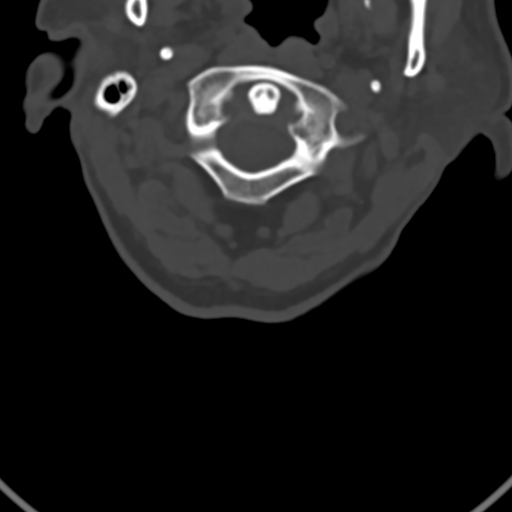
[im 73/80  bone]
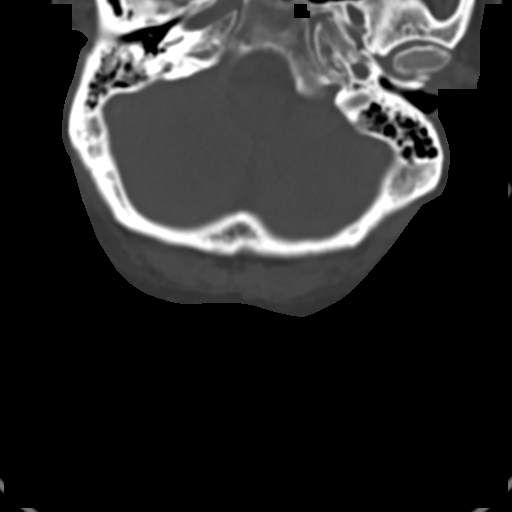

[Series 9: c_spine 2.0 sag bone · sagittal · 0.31mm/px · 5 of 61 slices shown, 6 images]
[im 21/61  bone]
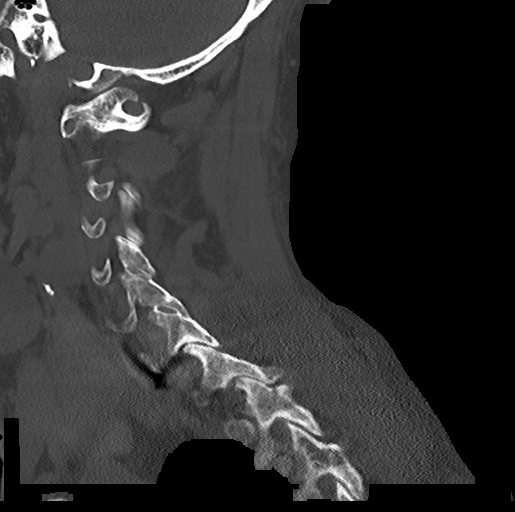
[im 26/61  bone]
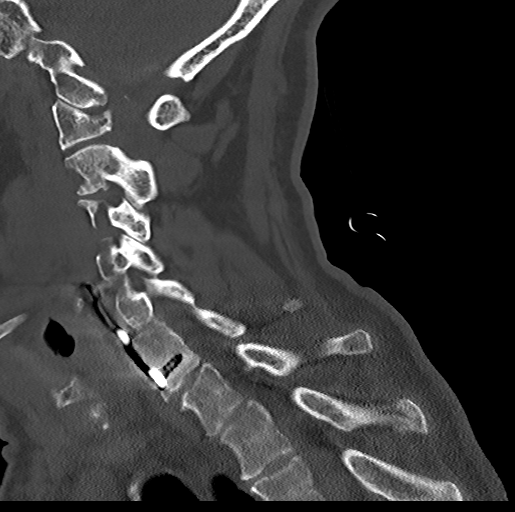
[im 31/61  soft-tissue]
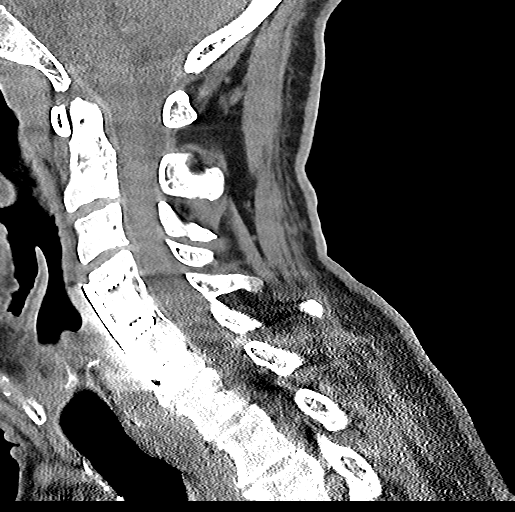
[im 31/61  bone]
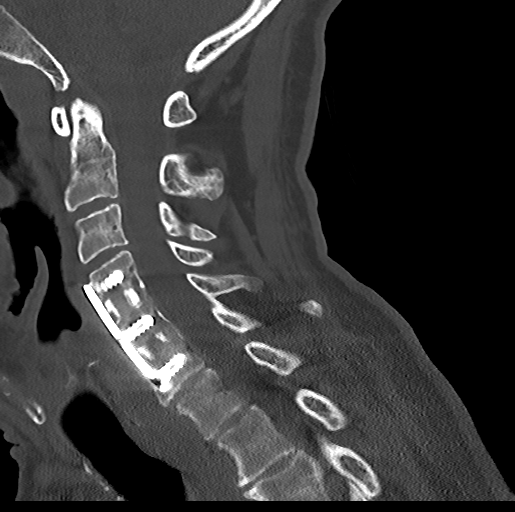
[im 36/61  bone]
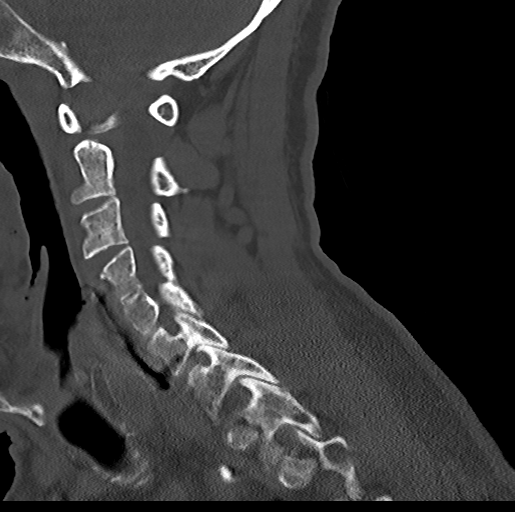
[im 41/61  bone]
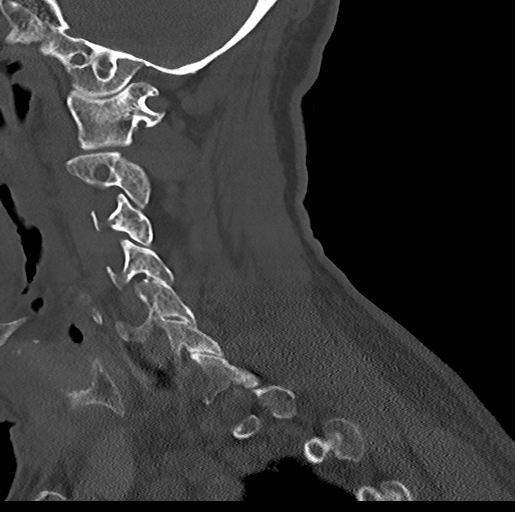

[Series 10: c_spine 2.0 cor bone · coronal · 0.23mm/px · 3 of 61 slices shown]
[im 13/61  bone]
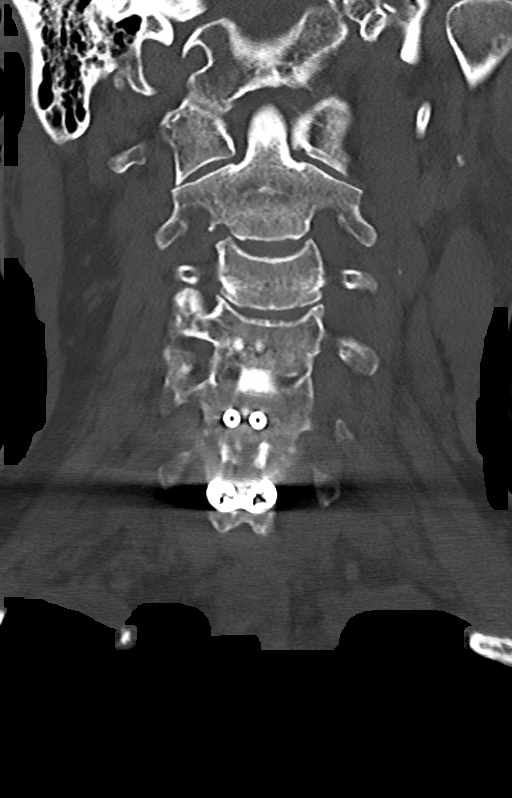
[im 25/61  bone]
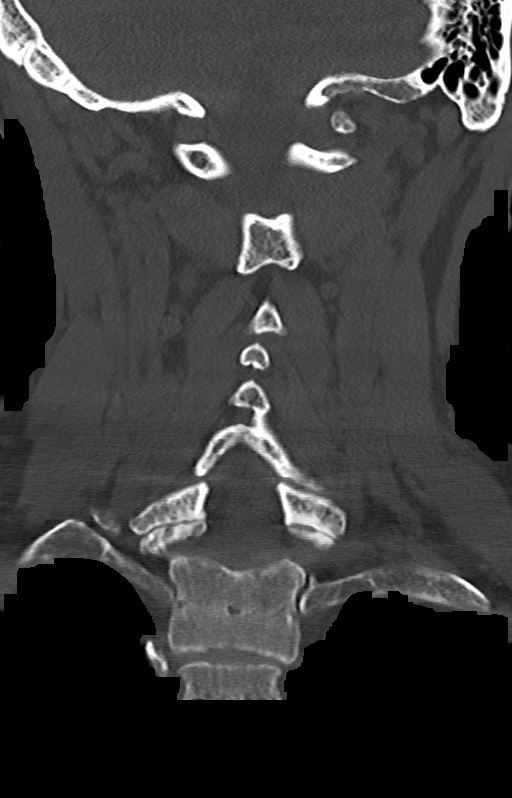
[im 37/61  bone]
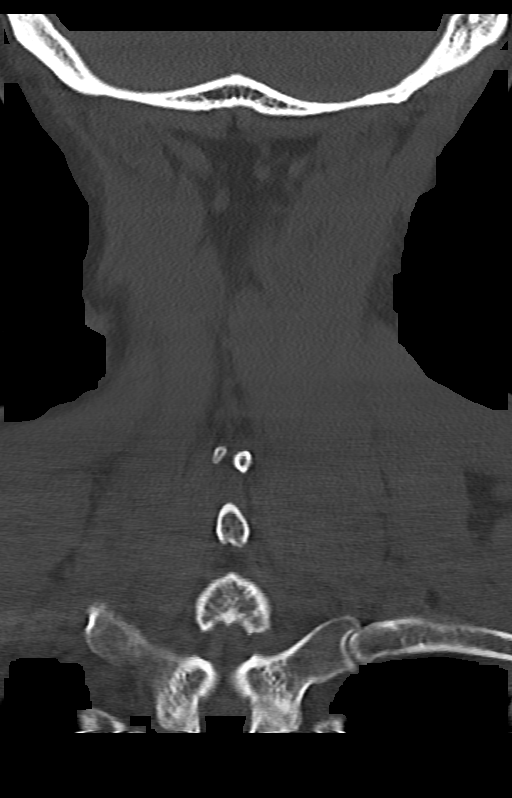

[14 of 33 positions shown; findings below may reference images not displayed]

FINDINGS: CT HEAD FINDINGS

Brain: No evidence of acute intracranial hemorrhage or extra-axial
collection.No evidence of mass lesion/concerning mass effect.The
ventricles are normal in size.Scattered subcortical and
periventricular white matter hypodensities, nonspecific but likely
sequela of chronic small vessel ischemic disease.Unchanged left
intake nucleus calcification.

Vascular: Vascular calcifications.  No hyperdense vessel.

Skull: Negative for skull fracture.

Other: None

CT MAXILLOFACIAL FINDINGS

Osseous: No fracture or mandibular dislocation. No destructive
process.

Orbits: Negative. No traumatic or inflammatory finding.

Sinuses: Minimal ethmoid air cell mucosal thickening. The mastoid
air cells are clear.

Soft tissues: There is focal forehead scalp soft tissue swelling.

CT CERVICAL SPINE FINDINGS

Alignment: Trace degenerative anterolisthesis at C7-T1.

Skull base and vertebrae: Prior C4-C6 ACDF with solid arthrodesis.
Intact hardware without evidence of loosening. There is no acute
cervical spine fracture.

Soft tissues and spinal canal: No prevertebral fluid or swelling. No
visible canal hematoma.

Disc levels: There is mild-to-moderate degenerative disc disease
above and below the ACDF, worst at C7-T1.

Upper chest: Negative.

Other: None
IMPRESSION: No acute intracranial abnormality. Chronic small vessel ischemic
disease.

No acute facial fracture.  Focal forehead soft tissue swelling.

No acute cervical spine fracture. Prior C4-C6 ACDF without evidence
of hardware complication. Mild-to-moderate degenerative disc disease
above below the ACDF, worst at C7-T1.

## 2022-12-03 ENCOUNTER — Other Ambulatory Visit: Payer: Self-pay

## 2022-12-03 ENCOUNTER — Emergency Department (HOSPITAL_COMMUNITY): Payer: No Typology Code available for payment source

## 2022-12-03 ENCOUNTER — Encounter (HOSPITAL_COMMUNITY): Payer: Self-pay

## 2022-12-03 ENCOUNTER — Emergency Department (HOSPITAL_COMMUNITY)
Admission: EM | Admit: 2022-12-03 | Discharge: 2022-12-03 | Disposition: A | Discharge status: Home / Self Care | Payer: No Typology Code available for payment source | Attending: Emergency Medicine | Admitting: Emergency Medicine

## 2022-12-03 DIAGNOSIS — S0101XA Laceration without foreign body of scalp, initial encounter: Secondary | ICD-10-CM | POA: Diagnosis present

## 2022-12-03 DIAGNOSIS — W19XXXA Unspecified fall, initial encounter: Secondary | ICD-10-CM | POA: Insufficient documentation

## 2022-12-03 DIAGNOSIS — E871 Hypo-osmolality and hyponatremia: Secondary | ICD-10-CM | POA: Insufficient documentation

## 2022-12-03 DIAGNOSIS — R55 Syncope and collapse: Secondary | ICD-10-CM | POA: Insufficient documentation

## 2022-12-03 DIAGNOSIS — Y903 Blood alcohol level of 60-79 mg/100 ml: Secondary | ICD-10-CM | POA: Insufficient documentation

## 2022-12-03 DIAGNOSIS — R58 Hemorrhage, not elsewhere classified: Secondary | ICD-10-CM | POA: Diagnosis not present

## 2022-12-03 LAB — CBC WITH DIFFERENTIAL/PLATELET
Abs Immature Granulocytes: 0.01 K/uL (ref 0.00–0.07)
Basophils Absolute: 0 K/uL (ref 0.0–0.1)
Basophils Relative: 1 %
Eosinophils Absolute: 0 K/uL (ref 0.0–0.5)
Eosinophils Relative: 1 %
HCT: 39 % (ref 39.0–52.0)
Hemoglobin: 13.7 g/dL (ref 13.0–17.0)
Immature Granulocytes: 0 %
Lymphocytes Relative: 17 %
Lymphs Abs: 0.6 K/uL — ABNORMAL LOW (ref 0.7–4.0)
MCH: 36.2 pg — ABNORMAL HIGH (ref 26.0–34.0)
MCHC: 35.1 g/dL (ref 30.0–36.0)
MCV: 103.2 fL — ABNORMAL HIGH (ref 80.0–100.0)
Monocytes Absolute: 0.4 K/uL (ref 0.1–1.0)
Monocytes Relative: 12 %
Neutro Abs: 2.5 K/uL (ref 1.7–7.7)
Neutrophils Relative %: 69 %
Platelets: 154 K/uL (ref 150–400)
RBC: 3.78 MIL/uL — ABNORMAL LOW (ref 4.22–5.81)
RDW: 11.8 % (ref 11.5–15.5)
WBC: 3.5 K/uL — ABNORMAL LOW (ref 4.0–10.5)
nRBC: 0 % (ref 0.0–0.2)

## 2022-12-03 LAB — RAPID URINE DRUG SCREEN, HOSP PERFORMED
Amphetamines: NOT DETECTED
Barbiturates: NOT DETECTED
Benzodiazepines: NOT DETECTED
Cocaine: NOT DETECTED
Opiates: NOT DETECTED
Tetrahydrocannabinol: NOT DETECTED

## 2022-12-03 LAB — BASIC METABOLIC PANEL
Anion gap: 15 (ref 5–15)
BUN: 6 mg/dL — ABNORMAL LOW (ref 8–23)
CO2: 19 mmol/L — ABNORMAL LOW (ref 22–32)
Calcium: 9.4 mg/dL (ref 8.9–10.3)
Chloride: 94 mmol/L — ABNORMAL LOW (ref 98–111)
Creatinine, Ser: 0.95 mg/dL (ref 0.61–1.24)
GFR, Estimated: >60 mL/min (ref >60)
Glucose, Bld: 81 mg/dL (ref 70–99)
Potassium: 3.9 mmol/L (ref 3.5–5.1)
Sodium: 128 mmol/L — ABNORMAL LOW (ref 135–145)

## 2022-12-03 LAB — URINALYSIS, ROUTINE W REFLEX MICROSCOPIC
Bilirubin Urine: NEGATIVE
Glucose, UA: NEGATIVE mg/dL
Hgb urine dipstick: NEGATIVE
Ketones, ur: 20 mg/dL — AB
Leukocytes,Ua: NEGATIVE
Nitrite: NEGATIVE
Protein, ur: NEGATIVE mg/dL
Specific Gravity, Urine: 1.01 (ref 1.005–1.030)
pH: 5 (ref 5.0–8.0)

## 2022-12-03 LAB — CBG MONITORING, ED: Glucose-Capillary: 70 mg/dL (ref 70–99)

## 2022-12-03 LAB — MAGNESIUM: Magnesium: 1.8 mg/dL (ref 1.7–2.4)

## 2022-12-03 LAB — TROPONIN I (HIGH SENSITIVITY): Troponin I (High Sensitivity): <2 ng/L (ref <18)

## 2022-12-03 LAB — ETHANOL: Alcohol, Ethyl (B): 76 mg/dL — ABNORMAL HIGH (ref <10)

## 2022-12-03 MED ORDER — IBUPROFEN 400 MG PO TABS
600.0000 mg | ORAL_TABLET | Freq: Once | ORAL | Status: AC
  Administered 2022-12-03: 600 mg via ORAL
  Filled 2022-12-03: qty 1

## 2022-12-03 MED ORDER — SODIUM CHLORIDE 0.9 % IV BOLUS
1000.0000 mL | Freq: Once | INTRAVENOUS | Status: AC
  Administered 2022-12-03: 1000 mL via INTRAVENOUS

## 2022-12-03 MED ORDER — ACETAMINOPHEN 500 MG PO TABS
1000.0000 mg | ORAL_TABLET | Freq: Once | ORAL | Status: AC
  Administered 2022-12-03: 1000 mg via ORAL
  Filled 2022-12-03: qty 2

## 2022-12-03 MED ORDER — LIDOCAINE-EPINEPHRINE (PF) 2 %-1:200000 IJ SOLN
10.0000 mL | Freq: Once | INTRAMUSCULAR | Status: AC
  Administered 2022-12-03: 10 mL
  Filled 2022-12-03: qty 20

## 2022-12-03 NOTE — Discharge Instructions (Addendum)
You have 6 staples placed in the back of your scalp.  You should keep the scalp dry for the next 2 days.  After that you can shower normally with soap and water.  Do not rub at the staples.  The staples should be removed in 10 days (12/13/22).  This can be done at your primary care doctor's clinic, urgent care, or the emergency department.  Your salt level was also low today on your blood test.  We talked about the importance of eating least 3 regular meals in the daytime.  He can continue drinking plenty of water.  Try to avoid to reduce how much beer and alcohol you are drinking.

## 2022-12-03 NOTE — ED Provider Notes (Signed)
[name] Provider Note   CSN: [id number] Arrival date & time: 12/03/22  1840     History  Chief Complaint  Patient presents with   Loss of Consciousness    Jeremy Hayden is a 67 y.o. male with history of hypertension presenting from home with an episode of syncope or loss of consciousness.  Patient reports that he lost his balance or became dizzy or lightheaded today and fell backwards and struck his head on a counter.  He is not certain how long he may have been unconscious.  He does this happened to him before.  He was hospitalized in September of last year at the same type of presentation.  He currently ports a mild to moderate headache.  No other complaints.  I reviewed his prior hospitalization records including September 2023 for recurrent syncopal episodes.  Patient had MRI of the brain that time was unrevealing.  His echocardiogram showed a normal EF of 60 to 65% and grade 1 diastolic dysfunction, otherwise normal.  He was noted to have electrolyte abnormalities with hypokalemia and hypomagnesemia while in the hospital.  HPI     Home Medications Prior to Admission medications   Medication Sig Start Date End Date Taking? Authorizing Provider  carbamide peroxide (DEBROX) 6.5 % OTIC solution Place 5-10 drops into both ears See admin instructions. Instill 5-10 drops into both ears 2 times a day for 6-7 days as needed for wax build-up. Return to clinic for irrigation.    [provider]  fluticasone (FLONASE) 50 MCG/ACT nasal spray Place 2 sprays into both nostrils daily as needed for allergies or rhinitis.    [provider]  hydrocortisone (ANUSOL-HC) 2.5 % rectal cream Place 1 application rectally 2 (two) times daily. Patient taking differently: Place 1 application  rectally 2 (two) times daily as needed for hemorrhoids. 08/20/21   Tomi Bamberger, PA-C  Multiple Vitamins-Minerals (MULTIVITAMIN WITH MINERALS)  tablet Take 1 tablet by mouth daily with breakfast.    [provider]  omeprazole (PRILOSEC) 20 MG capsule Take 20 mg by mouth daily before breakfast.    [provider]  polyethylene glycol (MIRALAX) 17 g packet Take 17 g by mouth daily. Dissolve one cap full in solution (water, gatorade, etc.) and administer once cap-full daily. You may titrate up daily by 1 cap-full until the patient is having pudding consistency of stools. After the patient is able to start passing softer stools they will need to be on 1/2 cap-full daily for 2 weeks. Patient taking differently: Take 17 g by mouth daily as needed for mild constipation. . 08/27/21   Couture, Cortni S, PA-C  senna-docusate (SENOKOT-S) 8.6-50 MG tablet Take 1 tablet by mouth daily. Patient taking differently: Take 1 tablet by mouth daily as needed for mild constipation. 06/19/20   LampteyBritta Mccreedy, MD  sodium chloride (OCEAN) 0.65 % SOLN nasal spray Place 1 spray into both nostrils as needed for congestion.    [provider]  thiamine (VITAMIN B-1) 100 MG tablet Take 100 mg by mouth daily.    [provider]  TYLENOL 500 MG tablet Take 500-1,000 mg by mouth every 6 (six) hours as needed for headache or mild pain.    [provider]      Allergies    Patient has no known allergies.    Review of Systems   Review of Systems  Physical Exam Updated Vital Signs BP (!) 170/109   Pulse  96   Temp 98.2 F (36.8 C) (Oral)   Resp 18   Ht 5\' 4"  (1.626 m)   Wt 62.1 kg   SpO2 97%   BMI 23.52 kg/m  Physical Exam Constitutional:      General: He is not in acute distress. HENT:     Head: Normocephalic.      Comments: Laceration with hematoma to the occipital scalp, no active bleeding Eyes:     Conjunctiva/sclera: Conjunctivae normal.     Pupils: Pupils are equal, round, and reactive to light.  Neck:     Comments: No spinal midline tenderness Cardiovascular:     Rate and Rhythm: Normal rate and  regular rhythm.  Pulmonary:     Effort: Pulmonary effort is normal. No respiratory distress.  Abdominal:     General: There is no distension.     Tenderness: There is no abdominal tenderness.  Musculoskeletal:     Cervical back: Normal range of motion and neck supple.  Skin:    General: Skin is warm and dry.  Neurological:     General: No focal deficit present.     Mental Status: He is alert. Mental status is at baseline.     ED Results / Procedures / Treatments   Labs (all labs ordered are listed, but only abnormal results are displayed) Labs Reviewed  BASIC METABOLIC PANEL - Abnormal; Notable for the following components:      Result Value   Sodium 128 (*)    Chloride 94 (*)    CO2 19 (*)    BUN 6 (*)    All other components within normal limits  CBC WITH DIFFERENTIAL/PLATELET - Abnormal; Notable for the following components:   WBC 3.5 (*)    RBC 3.78 (*)    MCV 103.2 (*)    MCH 36.2 (*)    Lymphs Abs 0.6 (*)    All other components within normal limits  URINALYSIS, ROUTINE W REFLEX MICROSCOPIC - Abnormal; Notable for the following components:   Ketones, ur 20 (*)    All other components within normal limits  ETHANOL - Abnormal; Notable for the following components:   Alcohol, Ethyl (B) 76 (*)    All other components within normal limits  MAGNESIUM  RAPID URINE DRUG SCREEN, HOSP PERFORMED  CBG MONITORING, ED  TROPONIN I (HIGH SENSITIVITY)    EKG None  Radiology CT Head Wo Contrast  Result Date: 12/03/2022 CLINICAL DATA:  Head trauma. EXAM: CT HEAD WITHOUT CONTRAST TECHNIQUE: Contiguous axial images were obtained from the base of the skull through the vertex without intravenous contrast. RADIATION DOSE REDUCTION: This exam was performed according to the departmental dose-optimization program which includes automated exposure control, adjustment of the mA and/or kV according to patient size and/or use of iterative reconstruction technique. COMPARISON:  Head CT  dated 04/05/2022. FINDINGS: Brain: The ventricles and sulci appropriate size for patient's age. Mild periventricular and deep white matter chronic microvascular ischemic changes noted. There is no acute intracranial hemorrhage. No mass effect or midline shift. No extra-axial fluid collection. Vascular: No hyperdense vessel or unexpected calcification. Skull: Normal. Negative for fracture or focal lesion. Sinuses/Orbits: No acute finding. Other: Left posterior parietal scalp laceration and hematoma. IMPRESSION: 1. No acute intracranial pathology. 2. Mild chronic microvascular ischemic changes. Electronically Signed   By: Elgie Collard M.D.   On: 12/03/2022 19:42   DG Chest 2 View  Result Date: 12/03/2022 CLINICAL DATA:  Syncope EXAM: CHEST - 2 VIEW COMPARISON:  10/28/2021 FINDINGS:  The heart size and mediastinal contours are within normal limits. Both lungs are clear. The visualized skeletal structures are unremarkable. IMPRESSION: No active cardiopulmonary disease. Electronically Signed   By: Jasmine Pang M.D.   On: 12/03/2022 19:28    Procedures .Marland KitchenLaceration Repair  Date/Time: 12/03/2022 9:09 PM  Performed by: Terald Sleeper, MD Authorized by: Terald Sleeper, MD   Consent:    Consent obtained:  Verbal   Consent given by:  Patient   Risks discussed:  Pain, poor cosmetic result and poor wound healing Universal protocol:    Procedure explained and questions answered to patient or proxy's satisfaction: yes     Site/side marked: yes     Immediately prior to procedure, a time out was called: yes     Patient identity confirmed:  Arm band Anesthesia:    Anesthesia method:  None Laceration details:    Location:  Scalp   Scalp location:  Occipital   Length (cm):  5 Exploration:    Imaging outcome: foreign body not noted     Wound exploration: wound explored through full range of motion and entire depth of wound visualized     Contaminated: no   Treatment:    Area cleansed with:   Saline   Amount of cleaning:  Standard   Irrigation solution:  Sterile saline   Irrigation method:  Pressure wash Skin repair:    Repair method:  Staples   Number of staples:  6 Approximation:    Approximation:  Close Repair type:    Repair type:  Simple Post-procedure details:    Dressing:  Non-adherent dressing   Procedure completion:  Tolerated well, no immediate complications     Medications Ordered in ED Medications  sodium chloride 0.9 % bolus 1,000 mL (1,000 mLs Intravenous New Bag/Given 12/03/22 2002)  lidocaine-EPINEPHrine (XYLOCAINE W/EPI) 2 %-1:200000 (PF) injection 10 mL (10 mLs Infiltration Given by Other 12/03/22 2108)  acetaminophen (TYLENOL) tablet 1,000 mg (1,000 mg Oral Given 12/03/22 2108)  ibuprofen (ADVIL) tablet 600 mg (600 mg Oral Given 12/03/22 2108)    ED Course/ Medical Decision Making/ A&P                             Medical Decision Making Amount and/or Complexity of Data Reviewed Labs: ordered. Radiology: ordered. ECG/medicine tests: ordered.  Risk OTC drugs. Prescription drug management.   This patient presents to the ED with concern for Syncope or near syncope. This involves an extensive number of treatment options, and is a complaint that carries with it a high risk of complications and morbidity.  The differential diagnosis includes arrhythmia versus anemia versus vasovagal episode versus dehydration versus other  Co-morbidities that complicate the patient evaluation: Chronic alcohol use and high risk of alcohol complications  Additional history obtained from EMS  External records from outside source obtained and reviewed including hospitalization records from September including echocardiogram that showed no emergent findings; MRI unremarkable, no clear cause for the patient's syncope issues.  I ordered and personally interpreted labs.  The pertinent results include: Hemoglobin within normal limits.  Troponin undetectable.  Alcohol level  mildly elevated.  UA unremarkable.  UDS unremarkable.  BMP does have some hyponatremia; mild and chronic  I ordered imaging studies including CT scan of head, x-ray of the chest I independently visualized and interpreted imaging which showed no emergent findings I agree with the radiologist interpretation  The patient was maintained on a cardiac monitor.  I personally viewed and interpreted the cardiac monitored which showed an underlying rhythm of: Sinus rhythm  Per my interpretation the patient's ECG shows normal sinus rhythm at acute ischemic findings  I ordered medication including normal saline fluids for hyponatremia.  Ibuprofen and Tylenol for pain  I have reviewed the patients home medicines and have made adjustments as needed  Test Considered: Low suspicion for acute PE.  After the interventions noted above, I reevaluated the patient and found that they have: improved  Social Determinants of Health: counseled the patient on alcohol cessation.  Dispostion:  After consideration of the diagnostic results and the patients response to treatment, I feel that the patent would benefit from outpatient PCP follow-up for staple removal and symptom recheck.    I suspect the patient has some mild hyponatremia related to beer [place]oto mania.  He is also not eating regularly.  We discussed the importance of eating at least 3 regular meals in the daytime, and also trying to reduce his alcohol intake.  He is not in withdrawal at this time.  The patient verbalized understanding.  He also discussed importance of staying hydrated, taking his time when standing up, which is when he tends to get dizzy.          Final Clinical Impression(s) / ED Diagnoses Final diagnoses:  Hyponatremia  Laceration of scalp without foreign body, initial encounter  Near syncope    Rx / DC Orders ED Discharge Orders     None         Terald Sleeper, MD 12/03/22 2312

## 2022-12-03 NOTE — ED Triage Notes (Signed)
Pt present to ED from home with c/o syncopal episode in kitchen. Pt states LOC for two minutes. Neighbor stated to EMS that pt has become forgetful and falling for the last 3-4 months. Pt  noncompliant with use of walker in home. Pt states having one twelve ounce of beer this morning. Pt noted with hematoma and laceration to left back side of head. Pt A&Ox4, c/o pain to head.

## 2022-12-03 NOTE — ED Notes (Signed)
To x-ray

## 2022-12-13 ENCOUNTER — Ambulatory Visit (HOSPITAL_COMMUNITY)
Admission: EM | Admit: 2022-12-13 | Discharge: 2022-12-13 | Disposition: A | Discharge status: Home / Self Care | Payer: No Typology Code available for payment source

## 2022-12-13 ENCOUNTER — Encounter (HOSPITAL_COMMUNITY): Payer: Self-pay | Admitting: *Deleted

## 2022-12-13 DIAGNOSIS — Z4802 Encounter for removal of sutures: Secondary | ICD-10-CM | POA: Diagnosis not present

## 2022-12-13 MED ORDER — MUPIROCIN CALCIUM 2 % EX CREA: 1.0000 | TOPICAL_CREAM | Freq: Two times a day (BID) | CUTANEOUS | 0 refills | Status: DC

## 2022-12-13 NOTE — Discharge Instructions (Signed)
Please keep your wound clean and dry.  You can shower and bathe this normal, just pat the area dry and apply the antibacterial ointment.  Please return to clinic or to your primary care if you have any signs or symptoms of infection.

## 2022-12-13 NOTE — ED Triage Notes (Signed)
Pt here for staple removal on the back of his head there are 6 staples placed 12/03/2022.

## 2022-12-13 NOTE — ED Provider Notes (Signed)
[facility]    CSN: [id number] Arrival date & time: 12/13/22  0809      History   Chief Complaint Chief Complaint  Patient presents with   Suture / Staple Removal    HPI Jeremy Hayden is a 67 y.o. male.   Patient presents to clinic for staple removal.  On May 21 he had 6 staples placed to the back of his head.  Denies any fever, drainage, warmth, pain or swelling.  Went to his primary care yesterday who rechecked his sodium, but did not remove his staples.    The history is provided by the patient and medical records.  Suture / Staple Removal    Past Medical History:  Diagnosis Date   Cancer [facility])    prostate   GERD (gastroesophageal reflux disease)    Hypertension     Patient Active Problem List   Diagnosis Date Noted   Syncope 04/05/2022   Hematemesis with nausea    Dehydration 03/26/2015   Chest pain 03/26/2015   Gastroenteritis 03/26/2015   Paroxysmal atrial fibrillation (HCC) 03/26/2015   Blood in stool    TINEA CAPITIS 07/01/2006   Hyperlipidemia 07/01/2006   Hypertension 07/01/2006   GERD 07/01/2006   DISC DISEASE, CERVICAL 07/01/2006    Past Surgical History:  Procedure Laterality Date   ESOPHAGOGASTRODUODENOSCOPY N/A 03/27/2015   Procedure: ESOPHAGOGASTRODUODENOSCOPY (EGD);  Surgeon: Ruffin Frederick, MD;  Location: [facility] ENDOSCOPY;  Service: Gastroenterology;  Laterality: N/A;   NECK SURGERY  11/2001   multilevel c spine diskectomy, decompression, arthrodesis with bone graft and plate placed.  Dr Sharolyn Douglas   PROSTATE SURGERY     ROTATOR CUFF REPAIR Right 2003   with partial acromiectomy/plasty       Home Medications    Prior to Admission medications   Medication Sig Start Date End Date Taking? Authorizing Provider  carbamide peroxide (DEBROX) 6.5 % OTIC solution Place 5-10 drops into both ears See admin instructions. Instill 5-10 drops into both ears 2 times a day for 6-7 days as needed for wax build-up. Return to clinic  for irrigation.   Yes [provider]  fluticasone (FLONASE) 50 MCG/ACT nasal spray Place 2 sprays into both nostrils daily as needed for allergies or rhinitis.   Yes [provider]  hydrocortisone (ANUSOL-HC) 2.5 % rectal cream Place 1 application rectally 2 (two) times daily. Patient taking differently: Place 1 application  rectally 2 (two) times daily as needed for hemorrhoids. 08/20/21  Yes Tomi Bamberger, PA-C  losartan (COZAAR) 25 MG tablet TAKE ONE-HALF TABLET BY MOUTH IN THE MORNING FOR HIGH BLOOD PRESSURE 11/19/22  Yes [provider]  Multiple Vitamins-Minerals (MULTIVITAMIN WITH MINERALS) tablet Take 1 tablet by mouth daily with breakfast.   Yes [provider]  mupirocin cream (BACTROBAN) 2 % Apply 1 Application topically 2 (two) times daily. 12/13/22  Yes Rinaldo Ratel, [place] N, FNP  omeprazole (PRILOSEC) 20 MG capsule Take 20 mg by mouth daily before breakfast.   Yes [provider]  polyethylene glycol (MIRALAX) 17 g packet Take 17 g by mouth daily. Dissolve one cap full in solution (water, gatorade, etc.) and administer once cap-full daily. You may titrate up daily by 1 cap-full until the patient is having pudding consistency of stools. After the patient is able to start passing softer stools they will need to be on 1/2 cap-full daily for 2 weeks. Patient taking differently: Take 17 g by mouth daily as needed for mild constipation. . 08/27/21  Yes Couture, Cortni S, PA-C  senna-docusate (SENOKOT-S) 8.6-50 MG tablet Take 1 tablet by mouth daily. Patient taking differently: Take 1 tablet by mouth daily as needed for mild constipation. 06/19/20  Yes Lamptey, Britta Mccreedy, MD  sodium chloride (OCEAN) 0.65 % SOLN nasal spray Place 1 spray into both nostrils as needed for congestion.   Yes [provider]  thiamine (VITAMIN B-1) 100 MG tablet Take 100 mg by mouth daily.   Yes [provider]  TYLENOL 500 MG tablet Take 500-1,000 mg by mouth  every 6 (six) hours as needed for headache or mild pain.   Yes [provider]    Family History Family History  Problem Relation Age of Onset   Ovarian cancer Mother    Cancer - Colon Father    Prostate cancer Father    Brain cancer Sister    Sickle cell anemia Brother     Social History Social History   Tobacco Use   Smoking status: Never   Smokeless tobacco: Never  Vaping Use   Vaping Use: Never used  Substance Use Topics   Alcohol use: Yes    Alcohol/week: 24.0 standard drinks of alcohol    Types: 24 Cans of beer per week    Comment: "a little bit...no liquor"   Drug use: No     Allergies   Patient has no known allergies.   Review of Systems Review of Systems  Constitutional:  Negative for fever.  Skin:  Positive for wound.     Physical Exam Triage Vital Signs ED Triage Vitals [12/13/22 0911]  Enc Vitals Group     BP (!) 175/102     Pulse Rate 76     Resp 18     Temp 97.8 F (36.6 C)     Temp Source Oral     SpO2 97 %     Weight      Height      Head Circumference      Peak Flow      Pain Score 0     Pain Loc      Pain Edu?      Excl. in GC?    No data found.  Updated Vital Signs BP (!) 175/102 (BP Location: Left Arm)   Pulse 76   Temp 97.8 F (36.6 C) (Oral)   Resp 18   SpO2 97%   Visual Acuity Right Eye Distance:   Left Eye Distance:   Bilateral Distance:    Right Eye Near:   Left Eye Near:    Bilateral Near:     Physical Exam Vitals and nursing note reviewed.  Constitutional:      Appearance: Normal appearance.  HENT:     Head: Normocephalic.     Right Ear: External ear normal.     Left Ear: External ear normal.     Nose: Nose normal.     Mouth/Throat:     Mouth: Mucous membranes are moist.  Eyes:     Conjunctiva/sclera: Conjunctivae normal.  Cardiovascular:     Rate and Rhythm: Normal rate.  Pulmonary:     Effort: Pulmonary effort is normal. No respiratory distress.  Musculoskeletal:        General: No  swelling. Normal range of motion.  Skin:    General: Skin is warm and dry.     Findings: Laceration present.          Comments: 6 staples to posterior scalp, edges well-approximated.  Scabbed with dried  blood, no warmth, tenderness or purulent drainage.  Neurological:     General: No focal deficit present.     Mental Status: He is alert and oriented to person, place, and time.  Psychiatric:        Mood and Affect: Mood normal.        Behavior: Behavior normal. Behavior is cooperative.      UC Treatments / Results  Labs (all labs ordered are listed, but only abnormal results are displayed) Labs Reviewed - No data to display  EKG   Radiology No results found.  Procedures Procedures (including critical care time)  Medications Ordered in UC Medications - No data to display  Initial Impression / Assessment and Plan / UC Course  I have reviewed the triage vital signs and the nursing notes.  Pertinent labs & imaging results that were available during my care of the patient were reviewed by me and considered in my medical decision making (see chart for details).  Vitals and triage reviewed, patient is hemodynamically stable.  Six staples to be removed by staff, no obvious signs or symptoms of infection.  Advised on wound care and educated on signs and symptoms of infection, return precautions given, no questions at this time.    Final Clinical Impressions(s) / UC Diagnoses   Final diagnoses:  Encounter for staple removal     Discharge Instructions      Please keep your wound clean and dry.  You can shower and bathe this normal, just pat the area dry and apply the antibacterial ointment.  Please return to clinic or to your primary care if you have any signs or symptoms of infection.     ED Prescriptions     Medication Sig Dispense Auth. Provider   mupirocin cream (BACTROBAN) 2 % Apply 1 Application topically 2 (two) times daily. 15 g Ariyanna Oien, [place] N, [place]       PDMP not reviewed this encounter.   Rinaldo Ratel [place], [place] 12/13/22 [phone]

## 2023-01-24 ENCOUNTER — Emergency Department (HOSPITAL_COMMUNITY): Payer: No Typology Code available for payment source

## 2023-01-24 ENCOUNTER — Emergency Department (HOSPITAL_COMMUNITY)
Admission: EM | Admit: 2023-01-24 | Discharge: 2023-01-24 | Disposition: A | Discharge status: Home / Self Care | Payer: No Typology Code available for payment source | Attending: Emergency Medicine | Admitting: Emergency Medicine

## 2023-01-24 ENCOUNTER — Encounter (HOSPITAL_COMMUNITY): Payer: Self-pay | Admitting: Emergency Medicine

## 2023-01-24 ENCOUNTER — Other Ambulatory Visit: Payer: Self-pay

## 2023-01-24 DIAGNOSIS — S0101XA Laceration without foreign body of scalp, initial encounter: Secondary | ICD-10-CM | POA: Insufficient documentation

## 2023-01-24 DIAGNOSIS — W19XXXA Unspecified fall, initial encounter: Secondary | ICD-10-CM

## 2023-01-24 DIAGNOSIS — W01198A Fall on same level from slipping, tripping and stumbling with subsequent striking against other object, initial encounter: Secondary | ICD-10-CM | POA: Insufficient documentation

## 2023-01-24 DIAGNOSIS — D696 Thrombocytopenia, unspecified: Secondary | ICD-10-CM | POA: Diagnosis not present

## 2023-01-24 DIAGNOSIS — I10 Essential (primary) hypertension: Secondary | ICD-10-CM | POA: Diagnosis not present

## 2023-01-24 DIAGNOSIS — F10920 Alcohol use, unspecified with intoxication, uncomplicated: Secondary | ICD-10-CM | POA: Insufficient documentation

## 2023-01-24 DIAGNOSIS — Z23 Encounter for immunization: Secondary | ICD-10-CM | POA: Diagnosis not present

## 2023-01-24 DIAGNOSIS — R030 Elevated blood-pressure reading, without diagnosis of hypertension: Secondary | ICD-10-CM | POA: Insufficient documentation

## 2023-01-24 DIAGNOSIS — E876 Hypokalemia: Secondary | ICD-10-CM | POA: Insufficient documentation

## 2023-01-24 DIAGNOSIS — E860 Dehydration: Secondary | ICD-10-CM | POA: Diagnosis not present

## 2023-01-24 DIAGNOSIS — S0093XA Contusion of unspecified part of head, initial encounter: Secondary | ICD-10-CM

## 2023-01-24 DIAGNOSIS — R000 Tachycardia, unspecified: Secondary | ICD-10-CM | POA: Diagnosis not present

## 2023-01-24 DIAGNOSIS — Y907 Blood alcohol level of 200-239 mg/100 ml: Secondary | ICD-10-CM | POA: Diagnosis not present

## 2023-01-24 DIAGNOSIS — F10929 Alcohol use, unspecified with intoxication, unspecified: Secondary | ICD-10-CM | POA: Diagnosis not present

## 2023-01-24 LAB — COMPREHENSIVE METABOLIC PANEL
ALT: 31 U/L (ref 0–44)
AST: 118 U/L — ABNORMAL HIGH (ref 15–41)
Albumin: 3.4 g/dL — ABNORMAL LOW (ref 3.5–5.0)
Alkaline Phosphatase: 69 U/L (ref 38–126)
Anion gap: 18 — ABNORMAL HIGH (ref 5–15)
BUN: 6 mg/dL — ABNORMAL LOW (ref 8–23)
CO2: 17 mmol/L — ABNORMAL LOW (ref 22–32)
Calcium: 8.7 mg/dL — ABNORMAL LOW (ref 8.9–10.3)
Chloride: 97 mmol/L — ABNORMAL LOW (ref 98–111)
Creatinine, Ser: 0.87 mg/dL (ref 0.61–1.24)
GFR, Estimated: >60 mL/min (ref >60)
Glucose, Bld: 106 mg/dL — ABNORMAL HIGH (ref 70–99)
Potassium: 3.3 mmol/L — ABNORMAL LOW (ref 3.5–5.1)
Sodium: 132 mmol/L — ABNORMAL LOW (ref 135–145)
Total Bilirubin: 1 mg/dL (ref 0.3–1.2)
Total Protein: 6.8 g/dL (ref 6.5–8.1)

## 2023-01-24 LAB — ETHANOL: Alcohol, Ethyl (B): 225 mg/dL — ABNORMAL HIGH (ref <10)

## 2023-01-24 LAB — CBC
HCT: 42.1 % (ref 39.0–52.0)
Hemoglobin: 14.9 g/dL (ref 13.0–17.0)
MCH: 36.9 pg — ABNORMAL HIGH (ref 26.0–34.0)
MCHC: 35.4 g/dL (ref 30.0–36.0)
MCV: 104.2 fL — ABNORMAL HIGH (ref 80.0–100.0)
Platelets: 108 10*3/uL — ABNORMAL LOW (ref 150–400)
RBC: 4.04 MIL/uL — ABNORMAL LOW (ref 4.22–5.81)
RDW: 12.1 % (ref 11.5–15.5)
WBC: 3.2 10*3/uL — ABNORMAL LOW (ref 4.0–10.5)
nRBC: 0 % (ref 0.0–0.2)

## 2023-01-24 LAB — CBG MONITORING, ED: Glucose-Capillary: 105 mg/dL — ABNORMAL HIGH (ref 70–99)

## 2023-01-24 MED ORDER — ACETAMINOPHEN 325 MG PO TABS
650.0000 mg | ORAL_TABLET | Freq: Once | ORAL | Status: AC
  Administered 2023-01-24: 650 mg via ORAL
  Filled 2023-01-24: qty 2

## 2023-01-24 MED ORDER — POTASSIUM CHLORIDE CRYS ER 20 MEQ PO TBCR
40.0000 meq | EXTENDED_RELEASE_TABLET | Freq: Once | ORAL | Status: AC
  Administered 2023-01-24: 40 meq via ORAL
  Filled 2023-01-24: qty 2

## 2023-01-24 MED ORDER — TETANUS-DIPHTH-ACELL PERTUSSIS 5-2.5-18.5 LF-MCG/0.5 IM SUSY
0.5000 mL | PREFILLED_SYRINGE | Freq: Once | INTRAMUSCULAR | Status: AC
  Administered 2023-01-24: 0.5 mL via INTRAMUSCULAR
  Filled 2023-01-24: qty 0.5

## 2023-01-24 MED ORDER — LIDOCAINE-EPINEPHRINE (PF) 2 %-1:200000 IJ SOLN
20.0000 mL | Freq: Once | INTRAMUSCULAR | Status: AC
  Administered 2023-01-24: 20 mL
  Filled 2023-01-24: qty 20

## 2023-01-24 MED ORDER — LACTATED RINGERS IV BOLUS
1000.0000 mL | Freq: Once | INTRAVENOUS | Status: AC
  Administered 2023-01-24: 1000 mL via INTRAVENOUS

## 2023-01-24 NOTE — Discharge Instructions (Addendum)
It was our pleasure to provide your ER care today - we hope that you feel better.  Fall precautions - use great care/caution when up and about to help decrease risk of falling. Also, make sure to avoid alcohol use - see resource guide provided in terms of following up with alcohol use disorder treatment program in the next couple days.   Make sure to drink plenty of fluids/stay well hydrated.   From today's labs your potassium level is mildly low - eat plenty of fruits and vegetables and follow up with primary care doctor this coming week for recheck. Also follow up for recheck of blood pressure in the coming week as it is high today.  Keep wound very clean. Have sutures removed, by your doctor or urgent care, in 9-10 days.   Return to ER if worse, new symptoms, new/severe pain, fevers, infection of wound, weak/fainting, chest pain, trouble breathing, or other concern.

## 2023-01-24 NOTE — ED Provider Notes (Signed)
EMERGENCY DEPARTMENT AT [facility] Provider Note   CSN: [id number] Arrival date & time: 01/24/23  4098     History  Chief Complaint  Patient presents with   Jeremy Hayden is a 67 y.o. male.  Pt s/p fall. States stood up, felt faint, fell back and hit head. Felt fine earlier today, no faintness or dizziness then. Denies associated chest pain or discomfort, no sob or unusual doe. Denies palpitations or sense of rapid, slow, or irregular heart beating. Denies recent blood loss, no melena, or rectal bleeding. Denies change in meds or new meds. +some etoh use tonight. Tetanus pt not sure.  Did hit head w lac to posterior scalp. Denies neck/back pain.  Denies extremity pain or injury. No new numbness/weakness. No change in speech or vision.  No fever or chills.   The history is provided by the patient, medical records and the EMS personnel.  Fall Pertinent negatives include no chest pain, no abdominal pain and no shortness of breath.       Home Medications Prior to Admission medications   Medication Sig Start Date End Date Taking? Authorizing Provider  carbamide peroxide (DEBROX) 6.5 % OTIC solution Place 5-10 drops into both ears See admin instructions. Instill 5-10 drops into both ears 2 times a day for 6-7 days as needed for wax build-up. Return to clinic for irrigation.    [provider]  fluticasone (FLONASE) 50 MCG/ACT nasal spray Place 2 sprays into both nostrils daily as needed for allergies or rhinitis.    [provider]  hydrocortisone (ANUSOL-HC) 2.5 % rectal cream Place 1 application rectally 2 (two) times daily. Patient taking differently: Place 1 application  rectally 2 (two) times daily as needed for hemorrhoids. 08/20/21   Tomi Bamberger, PA-C  losartan (COZAAR) 25 MG tablet TAKE ONE-HALF TABLET BY MOUTH IN THE MORNING FOR HIGH BLOOD PRESSURE 11/19/22   [provider]  Multiple Vitamins-Minerals (MULTIVITAMIN WITH  MINERALS) tablet Take 1 tablet by mouth daily with breakfast.    [provider]  mupirocin cream (BACTROBAN) 2 % Apply 1 Application topically 2 (two) times daily. 12/13/22   Garrison, [place] N, FNP  omeprazole (PRILOSEC) 20 MG capsule Take 20 mg by mouth daily before breakfast.    [provider]  polyethylene glycol (MIRALAX) 17 g packet Take 17 g by mouth daily. Dissolve one cap full in solution (water, gatorade, etc.) and administer once cap-full daily. You may titrate up daily by 1 cap-full until the patient is having pudding consistency of stools. After the patient is able to start passing softer stools they will need to be on 1/2 cap-full daily for 2 weeks. Patient taking differently: Take 17 g by mouth daily as needed for mild constipation. . 08/27/21   Couture, Cortni S, PA-C  senna-docusate (SENOKOT-S) 8.6-50 MG tablet Take 1 tablet by mouth daily. Patient taking differently: Take 1 tablet by mouth daily as needed for mild constipation. 06/19/20   LampteyBritta Mccreedy, MD  sodium chloride (OCEAN) 0.65 % SOLN nasal spray Place 1 spray into both nostrils as needed for congestion.    [provider]  thiamine (VITAMIN B-1) 100 MG tablet Take 100 mg by mouth daily.    [provider]  TYLENOL 500 MG tablet Take 500-1,000 mg by mouth every 6 (six) hours as needed for headache or mild pain.    [provider]      Allergies  Patient has no known allergies.    Review of Systems   Review of Systems  Constitutional:  Negative for chills, diaphoresis and fever.  HENT:  Negative for sore throat.   Eyes:  Negative for pain, redness and visual disturbance.  Respiratory:  Negative for cough and shortness of breath.   Cardiovascular:  Negative for chest pain, palpitations and leg swelling.  Gastrointestinal:  Negative for abdominal pain, nausea and vomiting.  Genitourinary:  Negative for dysuria and flank pain.  Musculoskeletal:  Negative for back pain  and neck pain.  Skin:  Positive for wound.  Neurological:  Negative for speech difficulty, weakness and numbness.  Hematological:  Does not bruise/bleed easily.    Physical Exam Updated Vital Signs BP (!) 142/95   Pulse (!) 102   Temp 98.3 F (36.8 C) (Oral)   Resp 18   Ht 1.626 m (5\' 4" )   Wt 59.9 kg   SpO2 100%   BMI 22.66 kg/m  Physical Exam Vitals and nursing note reviewed.  Constitutional:      Appearance: Normal appearance. He is well-developed.  HENT:     Head:     Comments: Contusion/laceration to posterior scalp.     Nose: Nose normal.     Mouth/Throat:     Mouth: Mucous membranes are moist.     Pharynx: Oropharynx is clear.  Eyes:     General: No scleral icterus.    Extraocular Movements: Extraocular movements intact.     Conjunctiva/sclera: Conjunctivae normal.     Pupils: Pupils are equal, round, and reactive to light.  Neck:     Trachea: No tracheal deviation.     Comments: Mild mid cervical tenderness, otherwise, CTLS spine, non tender, aligned, no step off. Good rom bil extremities without pain or focal bony tenderness. Distal pulses palp bil.  Cardiovascular:     Rate and Rhythm: Normal rate and regular rhythm.     Pulses: Normal pulses.     Heart sounds: Normal heart sounds. No murmur heard.    No friction rub. No gallop.  Pulmonary:     Effort: Pulmonary effort is normal. No accessory muscle usage or respiratory distress.     Breath sounds: Normal breath sounds.  Abdominal:     General: Bowel sounds are normal. There is no distension.     Palpations: Abdomen is soft.     Tenderness: There is no abdominal tenderness. There is no guarding.  Genitourinary:    Comments: No cva tenderness. Musculoskeletal:        General: No swelling.     Cervical back: Normal range of motion and neck supple. No rigidity.  Skin:    General: Skin is warm and dry.     Findings: No rash.  Neurological:     Mental Status: He is alert.     Comments: Alert, speech  clear. GCS 15. Motor/sens grossly intact bil.   Psychiatric:        Mood and Affect: Mood normal.     ED Results / Procedures / Treatments   Labs (all labs ordered are listed, but only abnormal results are displayed) Results for orders placed or performed during the hospital encounter of 01/24/23  CBC  Result Value Ref Range   WBC 3.2 (L) 4.0 - 10.5 K/uL   RBC 4.04 (L) 4.22 - 5.81 MIL/uL   Hemoglobin 14.9 13.0 - 17.0 g/dL   HCT 78.2 95.6 - 21.3 %   MCV 104.2 (H) 80.0 - 100.0 fL  MCH 36.9 (H) 26.0 - 34.0 pg   MCHC 35.4 30.0 - 36.0 g/dL   RDW 60.4 54.0 - 98.1 %   Platelets 108 (L) 150 - 400 K/uL   nRBC 0.0 0.0 - 0.2 %  Comprehensive metabolic panel  Result Value Ref Range   Sodium 132 (L) 135 - 145 mmol/L   Potassium 3.3 (L) 3.5 - 5.1 mmol/L   Chloride 97 (L) 98 - 111 mmol/L   CO2 17 (L) 22 - 32 mmol/L   Glucose, Bld 106 (H) 70 - 99 mg/dL   BUN 6 (L) 8 - 23 mg/dL   Creatinine, Ser 1.91 0.61 - 1.24 mg/dL   Calcium 8.7 (L) 8.9 - 10.3 mg/dL   Total Protein 6.8 6.5 - 8.1 g/dL   Albumin 3.4 (L) 3.5 - 5.0 g/dL   AST 478 (H) 15 - 41 U/L   ALT 31 0 - 44 U/L   Alkaline Phosphatase 69 38 - 126 U/L   Total Bilirubin 1.0 0.3 - 1.2 mg/dL   GFR, Estimated >29 >56 mL/min   Anion gap 18 (H) 5 - 15  Ethanol  Result Value Ref Range   Alcohol, Ethyl (B) 225 (H) <10 mg/dL  CBG monitoring, ED  Result Value Ref Range   Glucose-Capillary 105 (H) 70 - 99 mg/dL     EKG EKG Interpretation Date/Time:  Friday January 24 2023 20:17:34 EDT Ventricular Rate:  99 PR Interval:  190 QRS Duration:  96 QT Interval:  373 QTC Calculation: 479 R Axis:   -64  Text Interpretation: Sinus rhythm Left anterior fascicular block Confirmed by Cathren Laine ([id number]) on 01/24/2023 8:26:28 PM  Radiology CT Head Wo Contrast  Result Date: 01/24/2023 CLINICAL DATA:  Fall with 4 inch laceration to back of head. EXAM: CT HEAD WITHOUT CONTRAST CT CERVICAL SPINE WITHOUT CONTRAST TECHNIQUE: Multidetector CT imaging  of the head and cervical spine was performed following the standard protocol without intravenous contrast. Multiplanar CT image reconstructions of the cervical spine were also generated. RADIATION DOSE REDUCTION: This exam was performed according to the departmental dose-optimization program which includes automated exposure control, adjustment of the mA and/or kV according to patient size and/or use of iterative reconstruction technique. COMPARISON:  CT head 12/03/2022 and CT cervical spine 10/28/2021 FINDINGS: CT HEAD FINDINGS Brain: No intracranial hemorrhage, mass effect, or evidence of acute infarct. No hydrocephalus. No extra-axial fluid collection. Generalized cerebral atrophy. Ill-defined hypoattenuation within the cerebral white matter is nonspecific but consistent with chronic small vessel ischemic disease. Vascular: No hyperdense vessel. Intracranial arterial calcification. Skull: No fracture or focal lesion. Right posterior scalp hematoma/laceration. Sinuses/Orbits: No acute finding. Other: None. CT CERVICAL SPINE FINDINGS Alignment: No evidence of traumatic malalignment. Skull base and vertebrae: No acute fracture. No primary bone lesion or focal pathologic process. Soft tissues and spinal canal: No prevertebral fluid or swelling. No visible canal hematoma. Disc levels: ACDF C4-C6. Multilevel spondylosis, disc space height loss, and degenerative endplate changes greatest at C6-C7. Moderate facet arthropathy. No severe spinal canal narrowing. Upper chest: No acute abnormality. 3 mm right upper lobe nodule is stable since 2006 and benign. No follow-up recommended. Other: Carotid calcification. IMPRESSION: 1. No acute intracranial abnormality. 2. Right posterior scalp hematoma/laceration. No calvarial fracture. 3. No acute fracture in the cervical spine. Electronically Signed   By: Minerva Fester M.D.   On: 01/24/2023 20:27   CT Cervical Spine Wo Contrast  Result Date: 01/24/2023 CLINICAL DATA:  Fall  with 4 inch laceration to back of head.  EXAM: CT HEAD WITHOUT CONTRAST CT CERVICAL SPINE WITHOUT CONTRAST TECHNIQUE: Multidetector CT imaging of the head and cervical spine was performed following the standard protocol without intravenous contrast. Multiplanar CT image reconstructions of the cervical spine were also generated. RADIATION DOSE REDUCTION: This exam was performed according to the departmental dose-optimization program which includes automated exposure control, adjustment of the mA and/or kV according to patient size and/or use of iterative reconstruction technique. COMPARISON:  CT head 12/03/2022 and CT cervical spine 10/28/2021 FINDINGS: CT HEAD FINDINGS Brain: No intracranial hemorrhage, mass effect, or evidence of acute infarct. No hydrocephalus. No extra-axial fluid collection. Generalized cerebral atrophy. Ill-defined hypoattenuation within the cerebral white matter is nonspecific but consistent with chronic small vessel ischemic disease. Vascular: No hyperdense vessel. Intracranial arterial calcification. Skull: No fracture or focal lesion. Right posterior scalp hematoma/laceration. Sinuses/Orbits: No acute finding. Other: None. CT CERVICAL SPINE FINDINGS Alignment: No evidence of traumatic malalignment. Skull base and vertebrae: No acute fracture. No primary bone lesion or focal pathologic process. Soft tissues and spinal canal: No prevertebral fluid or swelling. No visible canal hematoma. Disc levels: ACDF C4-C6. Multilevel spondylosis, disc space height loss, and degenerative endplate changes greatest at C6-C7. Moderate facet arthropathy. No severe spinal canal narrowing. Upper chest: No acute abnormality. 3 mm right upper lobe nodule is stable since 2006 and benign. No follow-up recommended. Other: Carotid calcification. IMPRESSION: 1. No acute intracranial abnormality. 2. Right posterior scalp hematoma/laceration. No calvarial fracture. 3. No acute fracture in the cervical spine.  Electronically Signed   By: Minerva Fester M.D.   On: 01/24/2023 20:27    Procedures .Marland KitchenLaceration Repair  Date/Time: 01/24/2023 9:30 PM  Performed by: Cathren Laine, MD Authorized by: Cathren Laine, MD   Consent:    Consent given by:  Patient Anesthesia:    Anesthesia method:  Local infiltration   Local anesthetic:  Lidocaine 2% WITH epi Laceration details:    Location:  Scalp   Length (cm):  5   Depth (mm):  5 Exploration:    Imaging outcome: foreign body not noted     Wound exploration: entire depth of wound visualized     Contaminated: no   Treatment:    Area cleansed with:  Saline   Amount of cleaning:  Standard   Irrigation solution:  Sterile saline   Visualized foreign bodies/material removed: no     Layers/structures repaired:  Deep subcutaneous Deep subcutaneous:    Suture size:  4-0   Suture material:  Vicryl   Suture technique:  Simple interrupted   Number of sutures:  2 Skin repair:    Repair method:  Sutures   Suture size:  4-0   Suture material:  Prolene   Suture technique:  Simple interrupted   Number of sutures:  10 Repair type:    Repair type:  Intermediate Post-procedure details:    Dressing:  Antibiotic ointment   Procedure completion:  Tolerated well, no immediate complications     Medications Ordered in ED Medications  Tdap (BOOSTRIX) injection 0.5 mL (0.5 mLs Intramuscular Given 01/24/23 2037)  lidocaine-EPINEPHrine (XYLOCAINE W/EPI) 2 %-1:200000 (PF) injection 20 mL (20 mLs Infiltration Given 01/24/23 2037)    ED Course/ Medical Decision Making/ A&P                             Medical Decision Making Problems Addressed: Accidental fall, initial encounter: acute illness or injury with systemic symptoms that poses a threat to life or  bodily functions Acute alcoholic intoxication without complication [facility]): acute illness or injury with systemic symptoms that poses a threat to life or bodily functions Contusion of head, initial encounter:  acute illness or injury with systemic symptoms that poses a threat to life or bodily functions Dehydration: acute illness or injury Elevated blood pressure reading: acute illness or injury Hypokalemia: acute illness or injury Laceration of occipital region of scalp, initial encounter: acute illness or injury Thrombocytopenia (HCC): chronic illness or injury  Amount and/or Complexity of Data Reviewed Independent Historian: EMS    Details: hx External Data Reviewed: notes. Labs: ordered. Decision-making details documented in ED Course. Radiology: ordered and independent interpretation performed. Decision-making details documented in ED Course. ECG/medicine tests: ordered and independent interpretation performed. Decision-making details documented in ED Course.  Risk Prescription drug management. Decision regarding hospitalization.   Iv ns. Continuous pulse ox and cardiac monitoring. Labs ordered/sent. Imaging ordered.   Differential diagnosis includes head injury, sdh, syncope, dehydration, etc. Dispo decision including potential need for admission considered - will get labs and imaging and reassess.   Reviewed nursing notes and prior charts for additional history. External reports reviewed. Additional history from: EMS.   Cardiac monitor: sinus rhythm, rate 90.  Labs reviewed/interpreted by me - hct normal. Glucose normal. K mildly low, kcl po. Etoh elev. Ivf bolus. Po fluids/food. Ambulate in hall.   CT reviewed/interpreted by me - no hem or fx.   Wound repaired.   Acetaminophen po. Tetanus IM.   Pt ambulates to bathroom with steady gait. Is alert/oriented. Pt indicates feels fine/ready for discharge. Pt currently appears clinically sober and stable for d/c.   Rec close pcp f/u.  Return precautions provided.          Final Clinical Impression(s) / ED Diagnoses Final diagnoses:  None    Rx / DC Orders ED Discharge Orders     None         Cathren Laine,  MD 01/24/23 2246

## 2023-01-24 NOTE — ED Triage Notes (Addendum)
Pt BIB EMS from home after a fall. Pt stood up, had a possible syncopal episode, then fell in the floor. 4 in laceration to the back of head, bleeding controlled at this time. Denies neck and back pain. ETOH on board, admits to two 12oz beers.

## 2023-01-28 ENCOUNTER — Telehealth: Payer: Self-pay | Admitting: *Deleted

## 2023-01-28 NOTE — Telephone Encounter (Signed)
Transition Care Management Unsuccessful Follow-up Telephone Call  Date of discharge and from where:  [facility] 7/111/2024  Attempts:  1st Attempt  Reason for unsuccessful TCM follow-up call:  Left voice message

## 2023-01-29 ENCOUNTER — Telehealth: Payer: Self-pay | Admitting: *Deleted

## 2023-01-29 NOTE — Telephone Encounter (Signed)
Transition Care Management Unsuccessful Follow-up Telephone Call  Date of discharge and from where:  [facility] 7/12  Attempts:  2nd Attempt  Reason for unsuccessful TCM follow-up call:  No answer/busy

## 2023-02-01 ENCOUNTER — Encounter (HOSPITAL_COMMUNITY): Payer: Self-pay | Admitting: Emergency Medicine

## 2023-02-01 ENCOUNTER — Ambulatory Visit (HOSPITAL_COMMUNITY)
Admission: EM | Admit: 2023-02-01 | Discharge: 2023-02-01 | Disposition: A | Discharge status: Home / Self Care | Payer: No Typology Code available for payment source | Attending: Internal Medicine | Admitting: Internal Medicine

## 2023-02-01 DIAGNOSIS — S0101XD Laceration without foreign body of scalp, subsequent encounter: Secondary | ICD-10-CM | POA: Diagnosis not present

## 2023-02-01 DIAGNOSIS — Z4802 Encounter for removal of sutures: Secondary | ICD-10-CM | POA: Diagnosis not present

## 2023-02-01 MED ORDER — SULFAMETHOXAZOLE-TRIMETHOPRIM 800-160 MG PO TABS: 1.0000 | ORAL_TABLET | Freq: Two times a day (BID) | ORAL | 0 refills | Status: AC

## 2023-02-01 NOTE — Discharge Instructions (Addendum)
Please monitor for any increased redness, swelling, pus and follow-up sooner if this occurs.  Follow-up in 5 days at this urgent care for reevaluation.  I have prescribed you an antibiotic to prevent infection.

## 2023-02-01 NOTE — ED Provider Notes (Addendum)
[facility]    CSN: [id number] Arrival date & time: 02/01/23  1137      History   Chief Complaint Chief Complaint  Patient presents with   Suture / Staple Removal    HPI Jeremy Hayden is a 67 y.o. male.   Patient presents today for suture removal to his scalp laceration.  Patient was seen in the ER on 7/12 and had sutures placed.  Patient states that he was told that 9 sutures were placed.  Reports that it is still slightly painful and has had a little bit of bleeding but otherwise is healing well.  Patient states that he has not washed or cleaned the laceration.   Suture / Staple Removal    Past Medical History:  Diagnosis Date   Cancer [facility])    prostate   GERD (gastroesophageal reflux disease)    Hypertension     Patient Active Problem List   Diagnosis Date Noted   Syncope 04/05/2022   Hematemesis with nausea    Dehydration 03/26/2015   Chest pain 03/26/2015   Gastroenteritis 03/26/2015   Paroxysmal atrial fibrillation (HCC) 03/26/2015   Blood in stool    TINEA CAPITIS 07/01/2006   Hyperlipidemia 07/01/2006   Hypertension 07/01/2006   GERD 07/01/2006   DISC DISEASE, CERVICAL 07/01/2006    Past Surgical History:  Procedure Laterality Date   ESOPHAGOGASTRODUODENOSCOPY N/A 03/27/2015   Procedure: ESOPHAGOGASTRODUODENOSCOPY (EGD);  Surgeon: Ruffin Frederick, MD;  Location: [facility] ENDOSCOPY;  Service: Gastroenterology;  Laterality: N/A;   NECK SURGERY  11/2001   multilevel c spine diskectomy, decompression, arthrodesis with bone graft and plate placed.  Dr Sharolyn Douglas   PROSTATE SURGERY     ROTATOR CUFF REPAIR Right 2003   with partial acromiectomy/plasty       Home Medications    Prior to Admission medications   Medication Sig Start Date End Date Taking? Authorizing Provider  sulfamethoxazole-trimethoprim (BACTRIM DS) 800-160 MG tablet Take 1 tablet by mouth 2 (two) times daily for 7 days. 02/01/23 02/08/23 Yes Kaoru Rezendes, Acie Fredrickson, FNP  carbamide  peroxide (DEBROX) 6.5 % OTIC solution Place 5-10 drops into both ears See admin instructions. Instill 5-10 drops into both ears 2 times a day for 6-7 days as needed for wax build-up. Return to clinic for irrigation.    [provider]  fluticasone (FLONASE) 50 MCG/ACT nasal spray Place 2 sprays into both nostrils daily as needed for allergies or rhinitis.    [provider]  hydrocortisone (ANUSOL-HC) 2.5 % rectal cream Place 1 application rectally 2 (two) times daily. Patient taking differently: Place 1 application  rectally 2 (two) times daily as needed for hemorrhoids. 08/20/21   Tomi Bamberger, PA-C  losartan (COZAAR) 25 MG tablet TAKE ONE-HALF TABLET BY MOUTH IN THE MORNING FOR HIGH BLOOD PRESSURE 11/19/22   [provider]  Multiple Vitamins-Minerals (MULTIVITAMIN WITH MINERALS) tablet Take 1 tablet by mouth daily with breakfast.    [provider]  mupirocin cream (BACTROBAN) 2 % Apply 1 Application topically 2 (two) times daily. 12/13/22   Garrison, [place] N, FNP  omeprazole (PRILOSEC) 20 MG capsule Take 20 mg by mouth daily before breakfast.    [provider]  polyethylene glycol (MIRALAX) 17 g packet Take 17 g by mouth daily. Dissolve one cap full in solution (water, gatorade, etc.) and administer once cap-full daily. You may titrate up daily by 1 cap-full until the patient is having pudding consistency of stools. After the patient is able  to start passing softer stools they will need to be on 1/2 cap-full daily for 2 weeks. Patient taking differently: Take 17 g by mouth daily as needed for mild constipation. . 08/27/21   Couture, Cortni S, PA-C  senna-docusate (SENOKOT-S) 8.6-50 MG tablet Take 1 tablet by mouth daily. Patient taking differently: Take 1 tablet by mouth daily as needed for mild constipation. 06/19/20   LampteyBritta Mccreedy, MD  sodium chloride (OCEAN) 0.65 % SOLN nasal spray Place 1 spray into both nostrils as needed for congestion.     [provider]  thiamine (VITAMIN B-1) 100 MG tablet Take 100 mg by mouth daily.    [provider]  TYLENOL 500 MG tablet Take 500-1,000 mg by mouth every 6 (six) hours as needed for headache or mild pain.    [provider]    Family History Family History  Problem Relation Age of Onset   Ovarian cancer Mother    Cancer - Colon Father    Prostate cancer Father    Brain cancer Sister    Sickle cell anemia Brother     Social History Social History   Tobacco Use   Smoking status: Never   Smokeless tobacco: Never  Vaping Use   Vaping status: Never Used  Substance Use Topics   Alcohol use: Yes    Alcohol/week: 24.0 standard drinks of alcohol    Types: 24 Cans of beer per week    Comment: "a little bit...no liquor"   Drug use: No     Allergies   Patient has no known allergies.   Review of Systems Review of Systems Per HPI  Physical Exam Triage Vital Signs ED Triage Vitals [02/01/23 1255]  Encounter Vitals Group     BP (!) 163/99     Systolic BP Percentile      Diastolic BP Percentile      Pulse Rate 84     Resp 17     Temp 97.7 F (36.5 C)     Temp Source Oral     SpO2 98 %     Weight      Height      Head Circumference      Peak Flow      Pain Score      Pain Loc      Pain Education      Exclude from Growth Chart    No data found.  Updated Vital Signs BP (!) 163/99 (BP Location: Left Arm)   Pulse 84   Temp 97.7 F (36.5 C) (Oral)   Resp 17   SpO2 98%   Visual Acuity Right Eye Distance:   Left Eye Distance:   Bilateral Distance:    Right Eye Near:   Left Eye Near:    Bilateral Near:     Physical Exam Constitutional:      General: He is not in acute distress.    Appearance: Normal appearance. He is not toxic-appearing or diaphoretic.  HENT:     Head: Normocephalic and atraumatic.  Eyes:     Extraocular Movements: Extraocular movements intact.     Conjunctiva/sclera: Conjunctivae normal.  Pulmonary:      Effort: Pulmonary effort is normal.  Skin:    Comments: On original exam, patient has 9 visible sutures in place with dried blood surrounding.  No obvious swelling, erythema, purulent drainage noted.  Sutures were removed by nursing staff.  On second physical exam, wound edges are not closely approximated and laceration is  gaped open.  No bleeding noted.  Neurological:     General: No focal deficit present.     Mental Status: He is alert and oriented to person, place, and time. Mental status is at baseline.  Psychiatric:        Mood and Affect: Mood normal.        Behavior: Behavior normal.        Thought Content: Thought content normal.        Judgment: Judgment normal.      UC Treatments / Results  Labs (all labs ordered are listed, but only abnormal results are displayed) Labs Reviewed - No data to display  EKG   Radiology No results found.  Procedures Laceration Repair  Date/Time: 02/01/2023 2:45 PM  Performed by: Gustavus Bryant, FNP Authorized by: Gustavus Bryant, FNP   Consent:    Consent obtained:  Verbal   Consent given by:  Patient   Risks, benefits, and alternatives were discussed: yes     Risks discussed:  Infection and pain   Alternatives discussed:  No treatment, delayed treatment and observation Universal protocol:    Procedure explained and questions answered to patient or proxy's satisfaction: yes     Immediately prior to procedure, a time out was called: yes     Patient identity confirmed:  Verbally with patient and arm band Laceration details:    Location:  Scalp   Scalp location:  Occipital   Length (cm):  5 Pre-procedure details:    Preparation:  Patient was prepped and draped in usual sterile fashion Exploration:    Imaging outcome: foreign body not noted     Wound exploration: wound explored through full range of motion and entire depth of wound visualized     Contaminated: no   Treatment:    Area cleansed with:  Povidone-iodine   Amount of  cleaning:  Standard Skin repair:    Repair method:  Staples   Number of staples:  3 Approximation:    Approximation:  Close Repair type:    Repair type:  Simple Post-procedure details:    Dressing:  Non-adherent dressing   Procedure completion:  Tolerated well, no immediate complications  (including critical care time)  Medications Ordered in UC Medications - No data to display  Initial Impression / Assessment and Plan / UC Course  I have reviewed the triage vital signs and the nursing notes.  Pertinent labs & imaging results that were available during my care of the patient were reviewed by me and considered in my medical decision making (see chart for details).     On original physical exam, laceration appears to be healing well with close approximation of wound edges and wound appeared to be closed.  Sutures were removed by clinical staff with removal of dried blood as well, and I was called to the physical exam room during this as it appeared that wound edges were no longer closely approximated after removal.  On second physical exam, laceration was again gaped open.  Discussed with patient that given appearance on physical exam, do not think that leaving the wound open is safe.  Therefore, despite duration of time since laceration occurred, I do think that benefits outweigh risks with placing a few staples today to again close the wound.  Wound was irrigated extensively with saline and cleaned topically prior to staples being placed.  I placed 3 staples with close approximation of wound edges.  Patient advised to clean this daily and change dressing.  Also advised follow-up in 5 days to have it reevaluated.  Advised to monitor for signs of infection or bleeding and follow-up sooner if this occurs.  Will prescribe Bactrim antibiotic to prevent any infection associated with this given delayed healing.  Crcl is 69 so no dosage adjustment necessary. Patient's recent ED visit reports that  there were 2 dissolvable sutures placed in the deep tissue and 10 sutures to be removed.  3 clinical staff members visualized sutures and only noted 9 sutures to be removed.  Patient reports that he was told there are 9 sutures as well.  Patient verbalized understanding and was agreeable with plan. Final Clinical Impressions(s) / UC Diagnoses   Final diagnoses:  Laceration of occipital region of scalp, subsequent encounter  Visit for suture removal     Discharge Instructions      Please monitor for any increased redness, swelling, pus and follow-up sooner if this occurs.  Follow-up in 5 days at this urgent care for reevaluation.  I have prescribed you an antibiotic to prevent infection.    ED Prescriptions     Medication Sig Dispense Auth. Provider   sulfamethoxazole-trimethoprim (BACTRIM DS) 800-160 MG tablet Take 1 tablet by mouth 2 (two) times daily for 7 days. 14 tablet [place], Acie Fredrickson, [place]      PDMP not reviewed this encounter.   Gustavus Bryant, [place] 02/01/23 1448    Gustavus Bryant, [place] 02/01/23 [phone]

## 2023-02-01 NOTE — ED Triage Notes (Signed)
Pt reports that he had 9 sutures placed in posterior head last Friday at [facility] ED. Pt here to get sutures removed. Reports still has some bleeding and little pain. Took 2 Tylenol before coming in today.

## 2023-02-06 ENCOUNTER — Other Ambulatory Visit: Payer: Self-pay

## 2023-02-06 ENCOUNTER — Ambulatory Visit (HOSPITAL_COMMUNITY)
Admission: EM | Admit: 2023-02-06 | Discharge: 2023-02-06 | Disposition: A | Discharge status: Home / Self Care | Payer: No Typology Code available for payment source

## 2023-02-06 ENCOUNTER — Encounter (HOSPITAL_COMMUNITY): Payer: Self-pay | Admitting: Emergency Medicine

## 2023-02-06 DIAGNOSIS — S0101XD Laceration without foreign body of scalp, subsequent encounter: Secondary | ICD-10-CM | POA: Diagnosis not present

## 2023-02-06 DIAGNOSIS — Z5189 Encounter for other specified aftercare: Secondary | ICD-10-CM

## 2023-02-06 NOTE — ED Triage Notes (Signed)
Patient reports he was told to return in 5 days to have staples removed.  2 staples visible  to back of head.

## 2023-02-06 NOTE — Discharge Instructions (Addendum)
Your wound appears to be healing well. I would like to keep the staples in until July 30th (5 more days).  Continue taking the antibiotic as prescribed.  Come back in 5 days as stated above.

## 2023-02-06 NOTE — ED Provider Notes (Signed)
[facility]    CSN: [id number] Arrival date & time: 02/06/23  4098      History   Chief Complaint Chief Complaint  Patient presents with   Wound Check    HPI Jeremy Hayden is a 67 y.o. male.   Patient with history of alcoholism, prostate cancer, presents to urgent care for wound check.  He suffered a fall on January 24, 2023 resulting in laceration to the scalp.  This was repaired with sutures less than 18 hours after injury.  Sutures were removed on February 01, 2023 here at urgent care.  After suture removal, wound reopened and was found to be rather deep.  Patient did not clean wound at home and had not washed his scalp since injury on January 24, 2023.  Wound was cleansed thoroughly at urgent care on February 01, 2023 and reclosed loosely with 3 staples to encourage wound closure and healing.  Patient was sent home on Bactrim antibiotic twice daily for 7 days.  He has been cleansing the wound for the last 5 days at home and taking bactrim antibiotic as prescribed.      Past Medical History:  Diagnosis Date   Cancer [facility]    prostate   GERD (gastroesophageal reflux disease)    Hypertension     Patient Active Problem List   Diagnosis Date Noted   Syncope 04/05/2022   Hematemesis with nausea    Dehydration 03/26/2015   Chest pain 03/26/2015   Gastroenteritis 03/26/2015   Paroxysmal atrial fibrillation (HCC) 03/26/2015   Blood in stool    TINEA CAPITIS 07/01/2006   Hyperlipidemia 07/01/2006   Hypertension 07/01/2006   GERD 07/01/2006   DISC DISEASE, CERVICAL 07/01/2006    Past Surgical History:  Procedure Laterality Date   ESOPHAGOGASTRODUODENOSCOPY N/A 03/27/2015   Procedure: ESOPHAGOGASTRODUODENOSCOPY (EGD);  Surgeon: Ruffin Frederick, MD;  Location: [facility] ENDOSCOPY;  Service: Gastroenterology;  Laterality: N/A;   NECK SURGERY  11/2001   multilevel c spine diskectomy, decompression, arthrodesis with bone graft and plate placed.  Dr Sharolyn Douglas   PROSTATE  SURGERY     ROTATOR CUFF REPAIR Right 2003   with partial acromiectomy/plasty       Home Medications    Prior to Admission medications   Medication Sig Start Date End Date Taking? Authorizing Provider  carbamide peroxide (DEBROX) 6.5 % OTIC solution Place 5-10 drops into both ears See admin instructions. Instill 5-10 drops into both ears 2 times a day for 6-7 days as needed for wax build-up. Return to clinic for irrigation.    [provider]  fluticasone (FLONASE) 50 MCG/ACT nasal spray Place 2 sprays into both nostrils daily as needed for allergies or rhinitis.    [provider]  hydrocortisone (ANUSOL-HC) 2.5 % rectal cream Place 1 application rectally 2 (two) times daily. Patient taking differently: Place 1 application  rectally 2 (two) times daily as needed for hemorrhoids. 08/20/21   Tomi Bamberger, PA-C  losartan (COZAAR) 25 MG tablet TAKE ONE-HALF TABLET BY MOUTH IN THE MORNING FOR HIGH BLOOD PRESSURE 11/19/22   [provider]  Multiple Vitamins-Minerals (MULTIVITAMIN WITH MINERALS) tablet Take 1 tablet by mouth daily with breakfast.    [provider]  mupirocin cream (BACTROBAN) 2 % Apply 1 Application topically 2 (two) times daily. 12/13/22   Garrison, [place] N, FNP  omeprazole (PRILOSEC) 20 MG capsule Take 20 mg by mouth daily before breakfast.    [provider]  polyethylene glycol (MIRALAX)  17 g packet Take 17 g by mouth daily. Dissolve one cap full in solution (water, gatorade, etc.) and administer once cap-full daily. You may titrate up daily by 1 cap-full until the patient is having pudding consistency of stools. After the patient is able to start passing softer stools they will need to be on 1/2 cap-full daily for 2 weeks. Patient taking differently: Take 17 g by mouth daily as needed for mild constipation. . 08/27/21   Couture, Cortni S, PA-C  senna-docusate (SENOKOT-S) 8.6-50 MG tablet Take 1 tablet by mouth daily. Patient taking  differently: Take 1 tablet by mouth daily as needed for mild constipation. 06/19/20   LampteyBritta Mccreedy, MD  sodium chloride (OCEAN) 0.65 % SOLN nasal spray Place 1 spray into both nostrils as needed for congestion.    [provider]  sulfamethoxazole-trimethoprim (BACTRIM DS) 800-160 MG tablet Take 1 tablet by mouth 2 (two) times daily for 7 days. 02/01/23 02/08/23  Gustavus Bryant, FNP  thiamine (VITAMIN B-1) 100 MG tablet Take 100 mg by mouth daily.    [provider]  TYLENOL 500 MG tablet Take 500-1,000 mg by mouth every 6 (six) hours as needed for headache or mild pain.    [provider]    Family History Family History  Problem Relation Age of Onset   Ovarian cancer Mother    Cancer - Colon Father    Prostate cancer Father    Brain cancer Sister    Sickle cell anemia Brother     Social History Social History   Tobacco Use   Smoking status: Never   Smokeless tobacco: Never  Vaping Use   Vaping status: Never Used  Substance Use Topics   Alcohol use: Yes    Alcohol/week: 24.0 standard drinks of alcohol    Types: 24 Cans of beer per week    Comment: "a little bit...no liquor"   Drug use: No     Allergies   Patient has no known allergies.   Review of Systems Review of Systems Per HPI  Physical Exam Triage Vital Signs ED Triage Vitals  Encounter Vitals Group     BP 02/06/23 0903 (!) 150/89     Systolic BP Percentile --      Diastolic BP Percentile --      Pulse Rate 02/06/23 0903 86     Resp 02/06/23 0903 18     Temp 02/06/23 0903 98.2 F (36.8 C)     Temp Source 02/06/23 0903 Oral     SpO2 02/06/23 0903 96 %     Weight --      Height --      Head Circumference --      Peak Flow --      Pain Score 02/06/23 0901 9     Pain Loc --      Pain Education --      Exclude from Growth Chart --    No data found.  Updated Vital Signs BP (!) 150/89 (BP Location: Right Arm)   Pulse 86   Temp 98.2 F (36.8 C) (Oral)   Resp 18   SpO2  96%   Visual Acuity Right Eye Distance:   Left Eye Distance:   Bilateral Distance:    Right Eye Near:   Left Eye Near:    Bilateral Near:     Physical Exam Vitals and nursing note reviewed.  Constitutional:      Appearance: He is not ill-appearing or toxic-appearing.  HENT:  Head: Normocephalic and atraumatic.     Right Ear: Hearing and external ear normal.     Left Ear: Hearing and external ear normal.     Nose: Nose normal.     Mouth/Throat:     Lips: Pink.  Eyes:     General: Lids are normal. Vision grossly intact. Gaze aligned appropriately.     Extraocular Movements: Extraocular movements intact.     Conjunctiva/sclera: Conjunctivae normal.  Pulmonary:     Effort: Pulmonary effort is normal.  Musculoskeletal:     Cervical back: Neck supple.  Skin:    General: Skin is warm and dry.     Capillary Refill: Capillary refill takes less than 2 seconds.     Findings: Laceration present. No rash.     Comments: Laceration to the scalp appears well-healing, closely approximated, and without drainage.  No surrounding erythema, drainage, warmth, or tenderness.  3 staples present.  Neurological:     General: No focal deficit present.     Mental Status: He is alert and oriented to person, place, and time. Mental status is at baseline.     Cranial Nerves: No dysarthria or facial asymmetry.  Psychiatric:        Mood and Affect: Mood normal.        Speech: Speech normal.        Behavior: Behavior normal.        Thought Content: Thought content normal.        Judgment: Judgment normal.      UC Treatments / Results  Labs (all labs ordered are listed, but only abnormal results are displayed) Labs Reviewed - No data to display  EKG   Radiology No results found.  Procedures Procedures (including critical care time)  Medications Ordered in UC Medications - No data to display  Initial Impression / Assessment and Plan / UC Course  I have reviewed the triage vital  signs and the nursing notes.  Pertinent labs & imaging results that were available during my care of the patient were reviewed by me and considered in my medical decision making (see chart for details).   1.  Visit for wound check, laceration of scalp Wound appears to be well-healing without signs of further infection. I would like for the sutures to stay in place for 5 more days to continue to allow the wound to heal further prior to removing them. Advised patient to come back in 5 days on February 11, 2023 for recheck and likely staple removal at that time.  He is to continue taking antibiotic as prescribed.   Counseled patient on potential for adverse effects with medications prescribed/recommended today, strict ER and return-to-clinic precautions discussed, patient verbalized understanding.   Final Clinical Impressions(s) / UC Diagnoses   Final diagnoses:  Visit for wound check  Laceration of scalp, subsequent encounter     Discharge Instructions      Your wound appears to be healing well. I would like to keep the staples in until July 30th (5 more days).  Continue taking the antibiotic as prescribed.  Come back in 5 days as stated above.    ED Prescriptions   None    PDMP not reviewed this encounter.   Carlisle Beers, [place] 02/06/23 1019

## 2023-02-11 ENCOUNTER — Encounter (HOSPITAL_COMMUNITY): Payer: Self-pay | Admitting: Emergency Medicine

## 2023-02-11 ENCOUNTER — Ambulatory Visit (HOSPITAL_COMMUNITY)
Admission: EM | Admit: 2023-02-11 | Discharge: 2023-02-11 | Disposition: A | Discharge status: Home / Self Care | Payer: No Typology Code available for payment source

## 2023-02-11 DIAGNOSIS — Z4802 Encounter for removal of sutures: Secondary | ICD-10-CM | POA: Diagnosis not present

## 2023-02-11 DIAGNOSIS — S0101XS Laceration without foreign body of scalp, sequela: Secondary | ICD-10-CM | POA: Diagnosis not present

## 2023-02-11 NOTE — ED Provider Notes (Signed)
[facility]    CSN: [id number] Arrival date & time: 02/11/23  0802      History   Chief Complaint Chief Complaint  Patient presents with   Suture / Staple Removal    HPI Jeremy Hayden is a 67 y.o. male.   Patient presents to urgent care for staple removal.  Initial laceration injury was January 24, 2023 to the scalp as a result of fall.  Sutures were placed in the emergency department to close wound.  Sutures removed in urgent care on February 01, 2023, wound was found to have reopened post suture removal.  3 staples were placed by nurse practitioner at urgent care.  Patient was evaluated 5 days later on February 06, 2023 and advised to return in 5 more days for staple removal.  He has finished all of his Bactrim antibiotic as prescribed.  He has been gently cleansing the wound at home and has not had any fever, chills, drainage from the site, or redness/warmth surrounding the site.     Past Medical History:  Diagnosis Date   Cancer [facility])    prostate   GERD (gastroesophageal reflux disease)    Hypertension     Patient Active Problem List   Diagnosis Date Noted   Syncope 04/05/2022   Hematemesis with nausea    Dehydration 03/26/2015   Chest pain 03/26/2015   Gastroenteritis 03/26/2015   Paroxysmal atrial fibrillation (HCC) 03/26/2015   Blood in stool    TINEA CAPITIS 07/01/2006   Hyperlipidemia 07/01/2006   Hypertension 07/01/2006   GERD 07/01/2006   DISC DISEASE, CERVICAL 07/01/2006    Past Surgical History:  Procedure Laterality Date   ESOPHAGOGASTRODUODENOSCOPY N/A 03/27/2015   Procedure: ESOPHAGOGASTRODUODENOSCOPY (EGD);  Surgeon: Ruffin Frederick, MD;  Location: [facility] ENDOSCOPY;  Service: Gastroenterology;  Laterality: N/A;   NECK SURGERY  11/2001   multilevel c spine diskectomy, decompression, arthrodesis with bone graft and plate placed.  Dr Sharolyn Douglas   PROSTATE SURGERY     ROTATOR CUFF REPAIR Right 2003   with partial acromiectomy/plasty        Home Medications    Prior to Admission medications   Medication Sig Start Date End Date Taking? Authorizing Provider  carbamide peroxide (DEBROX) 6.5 % OTIC solution Place 5-10 drops into both ears See admin instructions. Instill 5-10 drops into both ears 2 times a day for 6-7 days as needed for wax build-up. Return to clinic for irrigation.    [provider]  fluticasone (FLONASE) 50 MCG/ACT nasal spray Place 2 sprays into both nostrils daily as needed for allergies or rhinitis.    [provider]  hydrocortisone (ANUSOL-HC) 2.5 % rectal cream Place 1 application rectally 2 (two) times daily. Patient taking differently: Place 1 application  rectally 2 (two) times daily as needed for hemorrhoids. 08/20/21   Tomi Bamberger, PA-C  losartan (COZAAR) 25 MG tablet TAKE ONE-HALF TABLET BY MOUTH IN THE MORNING FOR HIGH BLOOD PRESSURE 11/19/22   [provider]  Multiple Vitamins-Minerals (MULTIVITAMIN WITH MINERALS) tablet Take 1 tablet by mouth daily with breakfast.    [provider]  mupirocin cream (BACTROBAN) 2 % Apply 1 Application topically 2 (two) times daily. 12/13/22   Garrison, [place] N, FNP  omeprazole (PRILOSEC) 20 MG capsule Take 20 mg by mouth daily before breakfast.    [provider]  polyethylene glycol (MIRALAX) 17 g packet Take 17 g by mouth daily. Dissolve one cap full in solution (water, gatorade, etc.)  and administer once cap-full daily. You may titrate up daily by 1 cap-full until the patient is having pudding consistency of stools. After the patient is able to start passing softer stools they will need to be on 1/2 cap-full daily for 2 weeks. Patient taking differently: Take 17 g by mouth daily as needed for mild constipation. . 08/27/21   Couture, Cortni S, PA-C  senna-docusate (SENOKOT-S) 8.6-50 MG tablet Take 1 tablet by mouth daily. Patient taking differently: Take 1 tablet by mouth daily as needed for mild constipation.  06/19/20   LampteyBritta Mccreedy, MD  sodium chloride (OCEAN) 0.65 % SOLN nasal spray Place 1 spray into both nostrils as needed for congestion.    [provider]  thiamine (VITAMIN B-1) 100 MG tablet Take 100 mg by mouth daily.    [provider]  TYLENOL 500 MG tablet Take 500-1,000 mg by mouth every 6 (six) hours as needed for headache or mild pain.    [provider]    Family History Family History  Problem Relation Age of Onset   Ovarian cancer Mother    Cancer - Colon Father    Prostate cancer Father    Brain cancer Sister    Sickle cell anemia Brother     Social History Social History   Tobacco Use   Smoking status: Never   Smokeless tobacco: Never  Vaping Use   Vaping status: Never Used  Substance Use Topics   Alcohol use: Yes    Alcohol/week: 24.0 standard drinks of alcohol    Types: 24 Cans of beer per week    Comment: "a little bit...no liquor"   Drug use: No     Allergies   Patient has no known allergies.   Review of Systems Review of Systems Per HPI  Physical Exam Triage Vital Signs ED Triage Vitals  Encounter Vitals Group     BP 02/11/23 0814 (!) 140/92     Systolic BP Percentile --      Diastolic BP Percentile --      Pulse Rate 02/11/23 0814 84     Resp 02/11/23 0814 17     Temp 02/11/23 0814 98.3 F (36.8 C)     Temp Source 02/11/23 0814 Oral     SpO2 02/11/23 0814 96 %     Weight --      Height --      Head Circumference --      Peak Flow --      Pain Score 02/11/23 0813 3     Pain Loc --      Pain Education --      Exclude from Growth Chart --    No data found.  Updated Vital Signs BP (!) 140/92 (BP Location: Right Arm)   Pulse 84   Temp 98.3 F (36.8 C) (Oral)   Resp 17   SpO2 96%   Visual Acuity Right Eye Distance:   Left Eye Distance:   Bilateral Distance:    Right Eye Near:   Left Eye Near:    Bilateral Near:     Physical Exam Vitals and nursing note reviewed.  Constitutional:       Appearance: He is not ill-appearing or toxic-appearing.  HENT:     Head: Normocephalic and atraumatic.     Right Ear: Hearing and external ear normal.     Left Ear: Hearing and external ear normal.     Nose: Nose normal.     Mouth/Throat:  Lips: Pink.  Eyes:     General: Lids are normal. Vision grossly intact. Gaze aligned appropriately.     Extraocular Movements: Extraocular movements intact.     Conjunctiva/sclera: Conjunctivae normal.  Pulmonary:     Effort: Pulmonary effort is normal.  Musculoskeletal:     Cervical back: Neck supple.  Skin:    General: Skin is warm and dry.     Capillary Refill: Capillary refill takes less than 2 seconds.     Findings: Laceration (Laceration to the occipital scalp with 3 staples present, closely approximated, no drainage, warmth, erythema, or tenderness.  No underlying soft tissue swelling.) present. No rash.  Neurological:     General: No focal deficit present.     Mental Status: He is alert and oriented to person, place, and time. Mental status is at baseline.     Cranial Nerves: No dysarthria or facial asymmetry.  Psychiatric:        Mood and Affect: Mood normal.        Speech: Speech normal.        Behavior: Behavior normal.        Thought Content: Thought content normal.        Judgment: Judgment normal.      UC Treatments / Results  Labs (all labs ordered are listed, but only abnormal results are displayed) Labs Reviewed - No data to display  EKG   Radiology No results found.  Procedures Procedures (including critical care time)  Medications Ordered in UC Medications - No data to display  Initial Impression / Assessment and Plan / UC Course  I have reviewed the triage vital signs and the nursing notes.  Pertinent labs & imaging results that were available during my care of the patient were reviewed by me and considered in my medical decision making (see chart for details).   1.  Visit for suture removal, laceration  of scalp sequela Staples removed, wound well-approximated post staple removal.  Infection return precautions discussed.  Wound care discussed.   Counseled patient on potential for adverse effects with medications prescribed/recommended today, strict ER and return-to-clinic precautions discussed, patient verbalized understanding.    Final Clinical Impressions(s) / UC Diagnoses   Final diagnoses:  Visit for suture removal  Laceration of scalp, sequela     Discharge Instructions      We removed your staples today.   Return to urgent care as needed.   ED Prescriptions   None    PDMP not reviewed this encounter.   Reita May Momence, [place] 02/11/23 [phone]

## 2023-02-11 NOTE — Discharge Instructions (Signed)
We removed your staples today.   Return to urgent care as needed.

## 2023-02-11 NOTE — ED Triage Notes (Signed)
Pt reports he still had some bleeding at laceration site where got 3 staples placed. Reports finished antibiotic yesterday

## 2023-04-25 ENCOUNTER — Emergency Department (HOSPITAL_COMMUNITY)
Admission: EM | Admit: 2023-04-25 | Discharge: 2023-04-26 | Discharge status: Against Medical Advice | Payer: No Typology Code available for payment source | Attending: Emergency Medicine | Admitting: Emergency Medicine

## 2023-04-25 ENCOUNTER — Other Ambulatory Visit: Payer: Self-pay

## 2023-04-25 ENCOUNTER — Ambulatory Visit (HOSPITAL_COMMUNITY)
Admission: EM | Admit: 2023-04-25 | Discharge: 2023-04-25 | Disposition: A | Discharge status: Home / Self Care | Payer: No Typology Code available for payment source | Attending: Internal Medicine | Admitting: Internal Medicine

## 2023-04-25 ENCOUNTER — Encounter (HOSPITAL_COMMUNITY): Payer: Self-pay | Admitting: *Deleted

## 2023-04-25 DIAGNOSIS — Z5321 Procedure and treatment not carried out due to patient leaving prior to being seen by health care provider: Secondary | ICD-10-CM | POA: Insufficient documentation

## 2023-04-25 DIAGNOSIS — R55 Syncope and collapse: Secondary | ICD-10-CM | POA: Insufficient documentation

## 2023-04-25 LAB — COMPREHENSIVE METABOLIC PANEL
ALT: 28 U/L (ref 0–44)
AST: 103 U/L — ABNORMAL HIGH (ref 15–41)
Albumin: 3.5 g/dL (ref 3.5–5.0)
Alkaline Phosphatase: 87 U/L (ref 38–126)
Anion gap: 15 (ref 5–15)
BUN: <5 mg/dL — ABNORMAL LOW (ref 8–23)
CO2: 24 mmol/L (ref 22–32)
Calcium: 9.3 mg/dL (ref 8.9–10.3)
Chloride: 96 mmol/L — ABNORMAL LOW (ref 98–111)
Creatinine, Ser: 0.99 mg/dL (ref 0.61–1.24)
GFR, Estimated: >60 mL/min (ref >60)
Glucose, Bld: 108 mg/dL — ABNORMAL HIGH (ref 70–99)
Potassium: 3.6 mmol/L (ref 3.5–5.1)
Sodium: 135 mmol/L (ref 135–145)
Total Bilirubin: 1.1 mg/dL (ref 0.3–1.2)
Total Protein: 7.2 g/dL (ref 6.5–8.1)

## 2023-04-25 LAB — CBC
HCT: 42.1 % (ref 39.0–52.0)
Hemoglobin: 14.9 g/dL (ref 13.0–17.0)
MCH: 37.2 pg — ABNORMAL HIGH (ref 26.0–34.0)
MCHC: 35.4 g/dL (ref 30.0–36.0)
MCV: 105 fL — ABNORMAL HIGH (ref 80.0–100.0)
Platelets: 138 10*3/uL — ABNORMAL LOW (ref 150–400)
RBC: 4.01 MIL/uL — ABNORMAL LOW (ref 4.22–5.81)
RDW: 12.3 % (ref 11.5–15.5)
WBC: 2.7 10*3/uL — ABNORMAL LOW (ref 4.0–10.5)
nRBC: 0 % (ref 0.0–0.2)

## 2023-04-25 LAB — URINALYSIS, ROUTINE W REFLEX MICROSCOPIC
Bilirubin Urine: NEGATIVE
Glucose, UA: NEGATIVE mg/dL
Hgb urine dipstick: NEGATIVE
Ketones, ur: NEGATIVE mg/dL
Leukocytes,Ua: NEGATIVE
Nitrite: NEGATIVE
Protein, ur: NEGATIVE mg/dL
Specific Gravity, Urine: 1.006 (ref 1.005–1.030)
pH: 6 (ref 5.0–8.0)

## 2023-04-25 NOTE — ED Notes (Signed)
Pt called multiple times to be taken back to room, no answer.

## 2023-04-25 NOTE — ED Triage Notes (Addendum)
Pt reports he passed out yesterday and today. Pt hit his head on the Lt side today and has pain in Lt elbow. Pt hit is Rt elbow when he fell yesterday.. Pt wearing a cardiac monitor to document syncope episodes. Pt had an appt. With Cards but it was cancelled . Pt did not called the cardiology to report falls.

## 2023-04-25 NOTE — ED Triage Notes (Signed)
Pt reports he woke up on the ground . Pt also took tylenol for elbow pain today.

## 2023-04-25 NOTE — ED Triage Notes (Signed)
Pt reports he passed out in the street earlier this week.

## 2023-04-25 NOTE — ED Provider Notes (Signed)
Patient presents to urgent care for evaluation after he has experienced 2 syncopal episodes in the last 24 hours.  Patient is currently receiving workup with Holter monitor due to frequent syncope for the last couple of months.  He has never had 2 episodes in a 24-hour period.  Syncope happens instantaneously and without warning.  There is no preceding headache, shortness of breath, chest pain, heart palpitations, dizziness, or vision changes.  His brother is with him at the bedside today and contributes to the history stating that patient "fell out in the street" a couple of days ago.  He also passed out this morning in his home where he struck his head to the floor as well as the bilateral elbows.  Patient is unsure of how long he is unconscious as episodes are always unwitnessed.  Patient reports he is slightly confused after syncopal episodes, however comes to eventually. No history of seizure disorder or intracranial abnormalities.  Denies drug use and alcohol use.  History of atrial fibrillation, does not currently take anticoagulation.  Vital signs stable.  Neurologic exam is without focal deficit. EKG in clinic is without signs of acute ST/T wave changes. Given recent increased frequency of syncope without warning with suspected postictal phase, I would like for patient to go to the nearest emergency department to receive further workup and evaluation. Discussed recommendations with patient and his brother at the bedside who are agreeable with plan.  Patient's brother will take him to the ER via private vehicle.     Carlisle Beers, [place] 04/25/23 1419

## 2023-04-25 NOTE — ED Triage Notes (Signed)
The pt reports that he fell out earlier  today   hx of the same he was not incontinent of bowel or bladder no tongue injury

## 2023-04-25 NOTE — ED Triage Notes (Signed)
Brother with Patient at this time.

## 2023-05-07 ENCOUNTER — Emergency Department (HOSPITAL_COMMUNITY): Payer: No Typology Code available for payment source

## 2023-05-07 ENCOUNTER — Other Ambulatory Visit: Payer: Self-pay

## 2023-05-07 ENCOUNTER — Observation Stay (HOSPITAL_COMMUNITY)
Admission: EM | Admit: 2023-05-07 | Discharge: 2023-05-09 | Disposition: A | Discharge status: Home / Self Care | Payer: No Typology Code available for payment source | Attending: Family Medicine | Admitting: Family Medicine

## 2023-05-07 ENCOUNTER — Encounter (HOSPITAL_COMMUNITY): Payer: Self-pay

## 2023-05-07 DIAGNOSIS — I2510 Atherosclerotic heart disease of native coronary artery without angina pectoris: Secondary | ICD-10-CM | POA: Insufficient documentation

## 2023-05-07 DIAGNOSIS — I951 Orthostatic hypotension: Secondary | ICD-10-CM

## 2023-05-07 DIAGNOSIS — Z8546 Personal history of malignant neoplasm of prostate: Secondary | ICD-10-CM | POA: Diagnosis not present

## 2023-05-07 DIAGNOSIS — R000 Tachycardia, unspecified: Secondary | ICD-10-CM | POA: Diagnosis not present

## 2023-05-07 DIAGNOSIS — K219 Gastro-esophageal reflux disease without esophagitis: Secondary | ICD-10-CM

## 2023-05-07 DIAGNOSIS — I10 Essential (primary) hypertension: Secondary | ICD-10-CM

## 2023-05-07 DIAGNOSIS — R109 Unspecified abdominal pain: Secondary | ICD-10-CM | POA: Insufficient documentation

## 2023-05-07 DIAGNOSIS — R944 Abnormal results of kidney function studies: Secondary | ICD-10-CM | POA: Insufficient documentation

## 2023-05-07 DIAGNOSIS — Z955 Presence of coronary angioplasty implant and graft: Secondary | ICD-10-CM | POA: Diagnosis not present

## 2023-05-07 DIAGNOSIS — R079 Chest pain, unspecified: Secondary | ICD-10-CM | POA: Diagnosis not present

## 2023-05-07 DIAGNOSIS — E876 Hypokalemia: Secondary | ICD-10-CM | POA: Insufficient documentation

## 2023-05-07 DIAGNOSIS — Z79899 Other long term (current) drug therapy: Secondary | ICD-10-CM | POA: Insufficient documentation

## 2023-05-07 DIAGNOSIS — R55 Syncope and collapse: Secondary | ICD-10-CM

## 2023-05-07 LAB — CORTISOL-AM, BLOOD: Cortisol - AM: 8.6 ug/dL (ref 6.7–22.6)

## 2023-05-07 LAB — CBC
HCT: 35.5 % — ABNORMAL LOW (ref 39.0–52.0)
Hemoglobin: 12.7 g/dL — ABNORMAL LOW (ref 13.0–17.0)
MCH: 37.1 pg — ABNORMAL HIGH (ref 26.0–34.0)
MCHC: 35.8 g/dL (ref 30.0–36.0)
MCV: 103.8 fL — ABNORMAL HIGH (ref 80.0–100.0)
Platelets: 111 10*3/uL — ABNORMAL LOW (ref 150–400)
RBC: 3.42 MIL/uL — ABNORMAL LOW (ref 4.22–5.81)
RDW: 13.2 % (ref 11.5–15.5)
WBC: 4.2 10*3/uL (ref 4.0–10.5)
nRBC: 0 % (ref 0.0–0.2)

## 2023-05-07 LAB — BASIC METABOLIC PANEL
Anion gap: 10 (ref 5–15)
BUN: 12 mg/dL (ref 8–23)
CO2: 24 mmol/L (ref 22–32)
Calcium: 9 mg/dL (ref 8.9–10.3)
Chloride: 98 mmol/L (ref 98–111)
Creatinine, Ser: 1.26 mg/dL — ABNORMAL HIGH (ref 0.61–1.24)
GFR, Estimated: >60 mL/min (ref >60)
Glucose, Bld: 111 mg/dL — ABNORMAL HIGH (ref 70–99)
Potassium: 3.1 mmol/L — ABNORMAL LOW (ref 3.5–5.1)
Sodium: 132 mmol/L — ABNORMAL LOW (ref 135–145)

## 2023-05-07 LAB — TSH: TSH: 1.865 u[IU]/mL (ref 0.350–4.500)

## 2023-05-07 LAB — TROPONIN I (HIGH SENSITIVITY)
Troponin I (High Sensitivity): 4 ng/L (ref <18)
Troponin I (High Sensitivity): 4 ng/L (ref <18)

## 2023-05-07 LAB — MAGNESIUM: Magnesium: 1.6 mg/dL — ABNORMAL LOW (ref 1.7–2.4)

## 2023-05-07 MED ORDER — ENOXAPARIN SODIUM 40 MG/0.4ML IJ SOSY
40.0000 mg | PREFILLED_SYRINGE | INTRAMUSCULAR | Status: DC
  Administered 2023-05-07 – 2023-05-08 (×2): 40 mg via SUBCUTANEOUS
  Filled 2023-05-07 (×2): qty 0.4

## 2023-05-07 MED ORDER — VITAMIN D 25 MCG (1000 UNIT) PO TABS
5000.0000 [IU] | ORAL_TABLET | Freq: Every day | ORAL | Status: DC
  Administered 2023-05-08 – 2023-05-09 (×2): 5000 [IU] via ORAL
  Filled 2023-05-07 (×3): qty 5

## 2023-05-07 MED ORDER — ONDANSETRON HCL 4 MG PO TABS: 4.0000 mg | ORAL_TABLET | Freq: Four times a day (QID) | ORAL | Status: DC | PRN

## 2023-05-07 MED ORDER — LOSARTAN POTASSIUM 25 MG PO TABS
25.0000 mg | ORAL_TABLET | Freq: Every day | ORAL | Status: DC
  Administered 2023-05-07: 25 mg via ORAL
  Filled 2023-05-07: qty 1

## 2023-05-07 MED ORDER — NITROGLYCERIN 0.4 MG SL SUBL: 0.4000 mg | SUBLINGUAL_TABLET | SUBLINGUAL | Status: DC | PRN

## 2023-05-07 MED ORDER — THIAMINE MONONITRATE 100 MG PO TABS
100.0000 mg | ORAL_TABLET | Freq: Every day | ORAL | Status: DC
  Administered 2023-05-08 – 2023-05-09 (×2): 100 mg via ORAL
  Filled 2023-05-07 (×2): qty 1

## 2023-05-07 MED ORDER — ONDANSETRON HCL 4 MG/2ML IJ SOLN
4.0000 mg | Freq: Four times a day (QID) | INTRAMUSCULAR | Status: DC | PRN
  Administered 2023-05-08: 4 mg via INTRAVENOUS
  Filled 2023-05-07: qty 2

## 2023-05-07 MED ORDER — ACETAMINOPHEN 650 MG RE SUPP: 650.0000 mg | Freq: Four times a day (QID) | RECTAL | Status: DC | PRN

## 2023-05-07 MED ORDER — ADULT MULTIVITAMIN W/MINERALS CH
1.0000 | ORAL_TABLET | Freq: Every day | ORAL | Status: DC
  Administered 2023-05-08 – 2023-05-09 (×2): 1 via ORAL
  Filled 2023-05-07 (×2): qty 1

## 2023-05-07 MED ORDER — SALINE SPRAY 0.65 % NA SOLN: 1.0000 | NASAL | Status: DC | PRN

## 2023-05-07 MED ORDER — HYDRALAZINE HCL 25 MG PO TABS: 25.0000 mg | ORAL_TABLET | Freq: Four times a day (QID) | ORAL | Status: DC | PRN

## 2023-05-07 MED ORDER — ACETAMINOPHEN 325 MG PO TABS
650.0000 mg | ORAL_TABLET | Freq: Four times a day (QID) | ORAL | Status: DC | PRN
  Administered 2023-05-09: 650 mg via ORAL
  Filled 2023-05-07: qty 2

## 2023-05-07 MED ORDER — PANTOPRAZOLE SODIUM 40 MG PO TBEC
40.0000 mg | DELAYED_RELEASE_TABLET | Freq: Every day | ORAL | Status: DC
  Administered 2023-05-08 – 2023-05-09 (×2): 40 mg via ORAL
  Filled 2023-05-07 (×2): qty 1

## 2023-05-07 MED ORDER — POTASSIUM CHLORIDE CRYS ER 20 MEQ PO TBCR
40.0000 meq | EXTENDED_RELEASE_TABLET | Freq: Once | ORAL | Status: AC
  Administered 2023-05-07: 40 meq via ORAL
  Filled 2023-05-07: qty 2

## 2023-05-07 NOTE — ED Provider Triage Note (Signed)
Emergency Medicine Provider Triage Evaluation Note  Jeremy Hayden , a 67 y.o. male  was evaluated in triage.  Pt complains of chest pain. Report pain to mid chest last night with some weakness that went away while he was watching TV.  Pain returns today while he was making his bed.  Pain has subsided after EMS gave pt ASA and SL nitroglycerin.  No fever, chills, cough, n/v/d, diaphoresis  Review of Systems  Positive: As above Negative: As above  Physical Exam  BP (!) 129/95 (BP Location: Left Arm)   Pulse 99   Temp 97.6 F (36.4 C)   Resp 18   Ht 5\' 6"  (1.676 m)   Wt 63 kg   SpO2 97%   BMI 22.44 kg/m  Gen:   Awake, no distress   Resp:  Normal effort  MSK:   Moves extremities without difficulty  Other:    Medical Decision Making  Medically screening exam initiated at 1:01 PM.  Appropriate orders placed.  [name] was informed that the remainder of the evaluation will be completed by another provider, this initial triage assessment does not replace that evaluation, and the importance of remaining in the ED until their evaluation is complete.     Fayrene Helper, PA-C 05/07/23 [phone]

## 2023-05-07 NOTE — ED Triage Notes (Signed)
Pt bib ems from home c.o central cp since last night, worse when he woke up this morning. Pt also felt nauseated and dizzy. Pt Given 324 ASA and 1 nitroglycerin PTA. Pt denies any pain at this time. VSS

## 2023-05-07 NOTE — ED Notes (Signed)
ED TO INPATIENT HANDOFF REPORT  ED Nurse Name and Phone #: Joneen Boers, Paramedic / [phone]  S Name/Age/Gender Jeremy Hayden 67 y.o. male Room/Bed: 004C/004C  Code Status   Code Status: Prior  Home/SNF/Other Home Patient oriented to: self, place, time, and situation Is this baseline? Yes   Triage Complete: Triage complete  Chief Complaint Chest pain [R07.9]  Triage Note Pt bib ems from home c.o central cp since last night, worse when he woke up this morning. Pt also felt nauseated and dizzy. Pt Given 324 ASA and 1 nitroglycerin PTA. Pt denies any pain at this time. VSS    Allergies No Known Allergies  Level of Care/Admitting Diagnosis ED Disposition     ED Disposition  Admit   Condition  --   Comment  Hospital Area: [facility] [[id number]]  Level of Care: Telemetry Cardiac [103]  May place patient in observation at [facility] or [facility] if equivalent level of care is available:: No  Covid Evaluation: Asymptomatic - no recent exposure (last 10 days) testing not required  Diagnosis: Chest pain [284132]  Admitting Physician: Narda Bonds [[id number]]  Attending Physician: Narda Bonds [phone]          B Medical/Surgery History Past Medical History:  Diagnosis Date   Cancer (HCC)    prostate   GERD (gastroesophageal reflux disease)    Hypertension    Past Surgical History:  Procedure Laterality Date   ESOPHAGOGASTRODUODENOSCOPY N/A 03/27/2015   Procedure: ESOPHAGOGASTRODUODENOSCOPY (EGD);  Surgeon: Ruffin Frederick, MD;  Location: [facility] ENDOSCOPY;  Service: Gastroenterology;  Laterality: N/A;   NECK SURGERY  11/2001   multilevel c spine diskectomy, decompression, arthrodesis with bone graft and plate placed.  Dr Sharolyn Douglas   PROSTATE SURGERY     ROTATOR CUFF REPAIR Right 2003   with partial acromiectomy/plasty     A IV Location/Drains/Wounds Patient Lines/Drains/Airways Status     Active Line/Drains/Airways     Name  Placement date Placement time Site Days   Peripheral IV 01/24/23 20 G Left;Posterior Forearm 01/24/23  --  Forearm  103   Peripheral IV 05/07/23 18 G Left Antecubital 05/07/23  1247  Antecubital  less than 1            Intake/Output Last 24 hours No intake or output data in the 24 hours ending 05/07/23 1610  Labs/Imaging Results for orders placed or performed during the hospital encounter of 05/07/23 (from the past 48 hour(s))  Basic metabolic panel     Status: Abnormal   Collection Time: 05/07/23  1:05 PM  Result Value Ref Range   Sodium 132 (L) 135 - 145 mmol/L   Potassium 3.1 (L) 3.5 - 5.1 mmol/L    Comment: HEMOLYSIS AT THIS LEVEL MAY AFFECT RESULT   Chloride 98 98 - 111 mmol/L   CO2 24 22 - 32 mmol/L   Glucose, Bld 111 (H) 70 - 99 mg/dL    Comment: Glucose reference range applies only to samples taken after fasting for at least 8 hours.   BUN 12 8 - 23 mg/dL   Creatinine, Ser 5.36 (H) 0.61 - 1.24 mg/dL   Calcium 9.0 8.9 - 64.4 mg/dL   GFR, Estimated >03 >47 mL/min    Comment: (NOTE) Calculated using the CKD-EPI Creatinine Equation (2021)    Anion gap 10 5 - 15    Comment: Performed at [facility], [address] [place], [place] [address]  CBC  Status: Abnormal   Collection Time: 05/07/23  1:05 PM  Result Value Ref Range   WBC 4.2 4.0 - 10.5 K/uL   RBC 3.42 (L) 4.22 - 5.81 MIL/uL   Hemoglobin 12.7 (L) 13.0 - 17.0 g/dL   HCT 86.5 (L) 78.4 - 69.6 %   MCV 103.8 (H) 80.0 - 100.0 fL   MCH 37.1 (H) 26.0 - 34.0 pg   MCHC 35.8 30.0 - 36.0 g/dL   RDW 29.5 28.4 - 13.2 %   Platelets 111 (L) 150 - 400 K/uL   nRBC 0.0 0.0 - 0.2 %    Comment: Performed at [facility], [address] [place], [place] [address]  Troponin I (High Sensitivity)     Status: None   Collection Time: 05/07/23  1:05 PM  Result Value Ref Range   Troponin I (High Sensitivity) 4 <18 ng/L    Comment: (NOTE) Elevated high sensitivity troponin I (hsTnI) values and significant  changes  across serial measurements may suggest ACS but many other  chronic and acute conditions are known to elevate hsTnI results.  Refer to the "Links" section for chest pain algorithms and additional  guidance. Performed at [facility], [address], [place], [place] [address]   Troponin I (High Sensitivity)     Status: None   Collection Time: 05/07/23  3:05 PM  Result Value Ref Range   Troponin I (High Sensitivity) 4 <18 ng/L    Comment: (NOTE) Elevated high sensitivity troponin I (hsTnI) values and significant  changes across serial measurements may suggest ACS but many other  chronic and acute conditions are known to elevate hsTnI results.  Refer to the "Links" section for chest pain algorithms and additional  guidance. Performed at [facility], [address] [place], [place] [address]    No results found.  Pending Labs Unresulted Labs (From admission, onward)    None       Vitals/Pain Today's Vitals   05/07/23 1247 05/07/23 1248 05/07/23 1450 05/07/23 1530  BP:    (!) 182/109  Pulse:    77  Resp:    13  Temp:      SpO2: 97%   100%  Weight:  139 lb (63 kg)    Height:  5\' 6"  (1.676 m)    PainSc:  0-No pain 0-No pain     Isolation Precautions No active isolations  Medications Medications - No data to display  Mobility walks     Focused Assessments Cardiac Assessment Handoff:  Cardiac Rhythm: Normal sinus rhythm Lab Results  Component Value Date   CKTOTAL 103 12/26/2009   CKMB 1.2 12/26/2009   TROPONINI <0.03 03/27/2015   No results found for: "DDIMER" Does the Patient currently have chest pain? No    R Recommendations: See Admitting Provider Note  Report given to:   Additional Notes:

## 2023-05-07 NOTE — Plan of Care (Signed)

## 2023-05-07 NOTE — ED Notes (Signed)
Pt walked to the bathroom with minimal assistance.  

## 2023-05-07 NOTE — ED Provider Notes (Addendum)
[place] EMERGENCY DEPARTMENT AT [facility] Provider Note   CSN: [id number] Arrival date & time: 05/07/23  1235     History  Chief Complaint  Patient presents with   Chest Pain    Jeremy Hayden is a 67 y.o. male.  67 year old male with history of CAD presents with acute onset of chest pain that began yesterday.  Pain lasted for several minutes and went away by itself.  It occurred at rest.  Symptoms again returned today when he was doing light housework.  States that initially he had a syncopal event when the pain began.  When he awoke he still continued to have pain.  He was not short of breath or diaphoretic.  Had no symptoms prior to passing out.  Patient called EMS and was given nitroglycerin and aspirin and he is currently pain-free at this time.  Patient gets his care at the [facility] center       Home Medications Prior to Admission medications   Medication Sig Start Date End Date Taking? Authorizing Provider  carbamide peroxide (DEBROX) 6.5 % OTIC solution Place 5-10 drops into both ears See admin instructions. Instill 5-10 drops into both ears 2 times a day for 6-7 days as needed for wax build-up. Return to clinic for irrigation.    [provider]  fluticasone (FLONASE) 50 MCG/ACT nasal spray Place 2 sprays into both nostrils daily as needed for allergies or rhinitis.    [provider]  hydrocortisone (ANUSOL-HC) 2.5 % rectal cream Place 1 application rectally 2 (two) times daily. Patient taking differently: Place 1 application  rectally 2 (two) times daily as needed for hemorrhoids. 08/20/21   Tomi Bamberger, PA-C  losartan (COZAAR) 25 MG tablet TAKE ONE-HALF TABLET BY MOUTH IN THE MORNING FOR HIGH BLOOD PRESSURE 11/19/22   [provider]  Multiple Vitamins-Minerals (MULTIVITAMIN WITH MINERALS) tablet Take 1 tablet by mouth daily with breakfast.    [provider]  mupirocin cream (BACTROBAN) 2 % Apply 1 Application topically 2  (two) times daily. 12/13/22   Garrison, [place] N, FNP  omeprazole (PRILOSEC) 20 MG capsule Take 20 mg by mouth daily before breakfast.    [provider]  polyethylene glycol (MIRALAX) 17 g packet Take 17 g by mouth daily. Dissolve one cap full in solution (water, gatorade, etc.) and administer once cap-full daily. You may titrate up daily by 1 cap-full until the patient is having pudding consistency of stools. After the patient is able to start passing softer stools they will need to be on 1/2 cap-full daily for 2 weeks. Patient taking differently: Take 17 g by mouth daily as needed for mild constipation. . 08/27/21   Couture, Cortni S, PA-C  senna-docusate (SENOKOT-S) 8.6-50 MG tablet Take 1 tablet by mouth daily. Patient taking differently: Take 1 tablet by mouth daily as needed for mild constipation. 06/19/20   LampteyBritta Mccreedy, MD  sodium chloride (OCEAN) 0.65 % SOLN nasal spray Place 1 spray into both nostrils as needed for congestion.    [provider]  thiamine (VITAMIN B-1) 100 MG tablet Take 100 mg by mouth daily.    [provider]  TYLENOL 500 MG tablet Take 500-1,000 mg by mouth every 6 (six) hours as needed for headache or mild pain.    [provider]      Allergies    Patient has no known allergies.    Review of Systems   Review of Systems  All other systems  reviewed and are negative.   Physical Exam Updated Vital Signs BP (!) 129/95 (BP Location: Left Arm)   Pulse 99   Temp 97.6 F (36.4 C)   Resp 18   Ht 1.676 m (5\' 6" )   Wt 63 kg   SpO2 97%   BMI 22.44 kg/m  Physical Exam Vitals and nursing note reviewed.  Constitutional:      General: He is not in acute distress.    Appearance: Normal appearance. He is well-developed. He is not toxic-appearing.  HENT:     Head: Normocephalic and atraumatic.  Eyes:     General: Lids are normal.     Conjunctiva/sclera: Conjunctivae normal.     Pupils: Pupils are equal, round, and reactive  to light.  Neck:     Thyroid: No thyroid mass.     Trachea: No tracheal deviation.  Cardiovascular:     Rate and Rhythm: Normal rate and regular rhythm.     Heart sounds: Normal heart sounds. No murmur heard.    No gallop.  Pulmonary:     Effort: Pulmonary effort is normal. No respiratory distress.     Breath sounds: Normal breath sounds. No stridor. No decreased breath sounds, wheezing, rhonchi or rales.  Abdominal:     General: There is no distension.     Palpations: Abdomen is soft.     Tenderness: There is no abdominal tenderness. There is no rebound.  Musculoskeletal:        General: No tenderness. Normal range of motion.     Cervical back: Normal range of motion and neck supple.  Skin:    General: Skin is warm and dry.     Findings: No abrasion or rash.  Neurological:     Mental Status: He is alert and oriented to person, place, and time. Mental status is at baseline.     GCS: GCS eye subscore is 4. GCS verbal subscore is 5. GCS motor subscore is 6.     Cranial Nerves: Cranial nerves are intact. No cranial nerve deficit.     Sensory: No sensory deficit.     Motor: Motor function is intact.  Psychiatric:        Attention and Perception: Attention normal.        Speech: Speech normal.        Behavior: Behavior normal.     ED Results / Procedures / Treatments   Labs (all labs ordered are listed, but only abnormal results are displayed) Labs Reviewed  CBC - Abnormal; Notable for the following components:      Result Value   RBC 3.42 (*)    Hemoglobin 12.7 (*)    HCT 35.5 (*)    MCV 103.8 (*)    MCH 37.1 (*)    Platelets 111 (*)    All other components within normal limits  BASIC METABOLIC PANEL  TROPONIN I (HIGH SENSITIVITY)    EKG ED ECG REPORT   Date: 05/07/2023  Rate: 70  Rhythm: normal sinus rhythm  QRS Axis: normal  Intervals: normal  ST/T Wave abnormalities: normal and nonspecific ST changes  Conduction Disutrbances:none  Narrative Interpretation:    Old EKG Reviewed: unchanged  I have personally reviewed the EKG tracing and agree with the computerized printout as noted.   Radiology No results found.  Procedures Procedures    Medications Ordered in ED Medications - No data to display  ED Course/ Medical Decision Making/ A&P  Medical Decision Making  Patient is EKG per interpretation showed no acute ischemic changes noted here.  Mildly low potassium at 3.1.  Will give oral potassium at this time.  Currently pain-free at this time.  First troponin is negative.  Patient's chest x-ray per interpretation showed no acute findings.  Plan will be for hospitalist admission        Final Clinical Impression(s) / ED Diagnoses Final diagnoses:  None    Rx / DC Orders ED Discharge Orders     None         Lorre Nick, MD 05/07/23 1534    Lorre Nick, MD 05/07/23 1535

## 2023-05-07 NOTE — H&P (Addendum)
History and Physical    Patient: Jeremy Hayden ZOX:[id number] DOB: [birth year] DOA: 05/07/2023 DOS: the patient was seen and examined on 05/07/2023 PCP: [facility]  Patient coming from: Home  Chief Complaint:  Chief Complaint  Patient presents with   Chest Pain   HPI: Jeremy Hayden is a 67 y.o. male with medical history significant of CAD status post PCI and stent placement in 2013, prostate cancer, GERD, primary pretension.  Patient reports sharp left-sided upper chest pain that started yesterday evening, the day before admission.  Patient reports sitting down in his chair and not engaging in any physical activity at the time.  He had no associated dyspnea, diaphoresis, palpitations.  He reports taking some Tylenol which did not help tremendously with his pain but did dull it a little bit.  He reports going to sleep with his pain and waking up with continued pain.  He reports standing over his bed with an associated episode of syncope, also without specific prodrome, although he reports having other episodes of syncope with possible feelings of palpitations.  Patient called EMS for transport to the emergency department.  Patient states his pain was significantly improved after administration of nitroglycerin by EMS.   Review of Systems: As mentioned in the history of present illness. All other systems reviewed and are negative. Past Medical History:  Diagnosis Date   Cancer [facility])    prostate   GERD (gastroesophageal reflux disease)    Hypertension    Past Surgical History:  Procedure Laterality Date   ESOPHAGOGASTRODUODENOSCOPY N/A 03/27/2015   Procedure: ESOPHAGOGASTRODUODENOSCOPY (EGD);  Surgeon: Ruffin Frederick, MD;  Location: [facility] ENDOSCOPY;  Service: Gastroenterology;  Laterality: N/A;   NECK SURGERY  11/2001   multilevel c spine diskectomy, decompression, arthrodesis with bone graft and plate placed.  Dr Sharolyn Douglas   PROSTATE SURGERY     ROTATOR CUFF REPAIR  Right 2003   with partial acromiectomy/plasty   Social History:  reports that he has never smoked. He has never used smokeless tobacco. He reports current alcohol use of about 24.0 standard drinks of alcohol per week. He reports that he does not use drugs.  No Known Allergies  Family History  Problem Relation Age of Onset   Ovarian cancer Mother    Cancer - Colon Father    Prostate cancer Father    Brain cancer Sister    Sickle cell anemia Brother     Prior to Admission medications   Medication Sig Start Date End Date Taking? Authorizing Provider  carbamide peroxide (DEBROX) 6.5 % OTIC solution Place 5-10 drops into both ears See admin instructions. Instill 5-10 drops into both ears 2 times a day for 6-7 days as needed for wax build-up. Return to clinic for irrigation.    [provider]  fluticasone (FLONASE) 50 MCG/ACT nasal spray Place 2 sprays into both nostrils daily as needed for allergies or rhinitis.    [provider]  hydrocortisone (ANUSOL-HC) 2.5 % rectal cream Place 1 application rectally 2 (two) times daily. Patient taking differently: Place 1 application  rectally 2 (two) times daily as needed for hemorrhoids. 08/20/21   Tomi Bamberger, PA-C  losartan (COZAAR) 25 MG tablet TAKE ONE-HALF TABLET BY MOUTH IN THE MORNING FOR HIGH BLOOD PRESSURE 11/19/22   [provider]  Multiple Vitamins-Minerals (MULTIVITAMIN WITH MINERALS) tablet Take 1 tablet by mouth daily with breakfast.    [provider]  mupirocin cream (BACTROBAN) 2 % Apply 1 Application topically 2 (  two) times daily. 12/13/22   Garrison, [place] N, FNP  omeprazole (PRILOSEC) 20 MG capsule Take 20 mg by mouth daily before breakfast.    [provider]  polyethylene glycol (MIRALAX) 17 g packet Take 17 g by mouth daily. Dissolve one cap full in solution (water, gatorade, etc.) and administer once cap-full daily. You may titrate up daily by 1 cap-full until the patient is having  pudding consistency of stools. After the patient is able to start passing softer stools they will need to be on 1/2 cap-full daily for 2 weeks. Patient taking differently: Take 17 g by mouth daily as needed for mild constipation. . 08/27/21   Couture, Cortni S, PA-C  senna-docusate (SENOKOT-S) 8.6-50 MG tablet Take 1 tablet by mouth daily. Patient taking differently: Take 1 tablet by mouth daily as needed for mild constipation. 06/19/20   LampteyBritta Mccreedy, MD  sodium chloride (OCEAN) 0.65 % SOLN nasal spray Place 1 spray into both nostrils as needed for congestion.    [provider]  thiamine (VITAMIN B-1) 100 MG tablet Take 100 mg by mouth daily.    [provider]  TYLENOL 500 MG tablet Take 500-1,000 mg by mouth every 6 (six) hours as needed for headache or mild pain.    [provider]    Physical Exam: Vitals:   05/07/23 1236 05/07/23 1247 05/07/23 1248 05/07/23 1530  BP: (!) 129/95   (!) 182/109  Pulse: 99   77  Resp: 18   13  Temp: 97.6 F (36.4 C)     SpO2: 100% 97%  100%  Weight:   63 kg   Height:   5\' 6"  (1.676 m)    General exam: Appears calm and comfortable and in no acute distress. Conversant Respiratory: Clear to auscultation. Respiratory effort normal with no intercostal retractions or use of accessory muscles Cardiovascular: S1 & S2 heard, RRR. No murmurs, rubs, gallops or clicks. No LE edema Gastrointestinal: Abdomen is non-distended, soft and mildly tender. No masses felt. Normal bowel sounds heard Neurologic: No focal neurological deficits Musculoskeletal: No calf tenderness Skin: No cyanosis. No new rashes Psychiatry: Alert and oriented x4. Memory intact. Mood & affect appropriate  Data Reviewed: There are no new results to review at this time.  Assessment and Plan:  Chest pain Somewhat atypical, although symptoms did improve with nitroglycerin and patient reports that his pain is similar to how he felt when he had his last need for  heart catheterization with stent placement.  Requested cardiology consult by EDP.  Patient reports having an echocardiogram performed within the last month but is unsure of results. Currently chest pain free.  Troponin negative.  EKG is normal sinus rhythm without new EKG changes noted. -Cardiology recommendations -Will defer need for repeat Transthoracic Echocardiogram to cardiology -Cardiac telemetry unit -Lipid panel in AM  Syncope Concern there could be a cardiac etiology. -Telemetry -Orthostatic vitals  Asymptomatic uncontrolled hypertension Chest pain does not appear to be related to hypertension as he continues to have significant hypertension without continued chest pain symptoms.  Patient reports that he has had uncontrolled hypertension at baseline.  He currently takes losartan 12.5 mg daily. -Increase to losartan 25 mg daily -Hydralazine as needed  Elevated creatinine Baseline creatinine of about 1.  Creatinine of 1.26 on admission. -Repeat BMP in a.m.; if worsening will hold losartan  Hypokalemia Potassium of 3.1 on admission. -Potassium supplementation -Check magnesium  GERD -Protonix while inpatient    Advance Care Planning:  Code Status: Full Code.  Discussed with patient at bedside.  Consults: Cardiology  Family Communication: None at bedside    Author: Jacquelin Hawking, MD 05/07/2023 3:54 PM  For on call review [email or web address].

## 2023-05-07 NOTE — Consult Note (Signed)
Cardiology Consultation   Patient ID: Jeremy Hayden MRN: [id number]; DOB: [birth year]  Admit date: 05/07/2023 Date of Consult: 05/07/2023  PCP:  [facility], [facility]   [facility] Providers Cardiologist:  Yates Decamp, MD  Click here to update MD or APP on Care Team, Refresh:1}     Patient Profile:   Jeremy Hayden is a 67 y.o. male with a hx of hypertension who is being seen 05/07/2023 for the evaluation of syncope at the request of Jacquelin Hawking, MD.  History of Present Illness:   Jeremy Hayden is a 67 y.o. longstanding history of hypertension,?  Alcohol abuse although patient states that he only drinks 1 beer a day at most 2 beers a day, was drinking liquor as well to shots of liquor and 2 beers about 3 to 4 months ago, since he has had recurrent fall, he has reduced his drinking, does not use any other drugs, admitted to the hospital with syncope.  Patient states that this morning he was making his bed, he fell back words and lost consciousness.  He also states that he has been having some chest pain and further questioning states that he has had some acid reflux and yesterday morning he had discomfort in his chest that lasted for 30 minutes or so on and off and again this morning.  He did not have any chest pain prior to passing out or he has not had any further chest pain since he has been here.  Patient has had multiple falls, has had multiple ED visits, has also had scalp injury in July needing stitches.  Patient states that he wore an event monitor for [facility] and results of which are pending.  Stated that he has had a near syncopal spell while wearing the monitor.  Patient denies dyspnea, palpitations, symptoms of claudication or neurologic deficits.   Past Medical History:  Diagnosis Date   Cancer [facility])    prostate   GERD (gastroesophageal reflux disease)    Hypertension     Past Surgical History:  Procedure Laterality Date    ESOPHAGOGASTRODUODENOSCOPY N/A 03/27/2015   Procedure: ESOPHAGOGASTRODUODENOSCOPY (EGD);  Surgeon: Ruffin Frederick, MD;  Location: [facility] ENDOSCOPY;  Service: Gastroenterology;  Laterality: N/A;   NECK SURGERY  11/2001   multilevel c spine diskectomy, decompression, arthrodesis with bone graft and plate placed.  Dr Sharolyn Douglas   PROSTATE SURGERY     ROTATOR CUFF REPAIR Right 2003   with partial acromiectomy/plasty     Home Medications:  Prior to Admission medications   Medication Sig Start Date End Date Taking? Authorizing Provider  Cholecalciferol 125 MCG (5000 UT) capsule Take 5,000 Units by mouth daily. 03/20/23  Yes [provider]  fluticasone (FLONASE) 50 MCG/ACT nasal spray Place 2 sprays into both nostrils daily as needed for allergies or rhinitis.   Yes [provider]  hydrocortisone (ANUSOL-HC) 2.5 % rectal cream Place 1 application rectally 2 (two) times daily. Patient taking differently: Place 1 application  rectally 2 (two) times daily as needed for hemorrhoids. 08/20/21  Yes Tomi Bamberger, PA-C  losartan (COZAAR) 25 MG tablet TAKE ONE-HALF TABLET BY MOUTH IN THE MORNING FOR HIGH BLOOD PRESSURE 11/19/22  Yes [provider]  Multiple Vitamins-Minerals (MULTIVITAMIN WITH MINERALS) tablet Take 1 tablet by mouth daily with breakfast.   Yes [provider]  omeprazole (PRILOSEC) 20 MG capsule Take 20 mg by mouth daily before breakfast.   Yes [provider]  sodium chloride (  OCEAN) 0.65 % SOLN nasal spray Place 1 spray into both nostrils as needed for congestion.   Yes [provider]  thiamine (VITAMIN B-1) 100 MG tablet Take 100 mg by mouth daily.   Yes [provider]  TYLENOL 500 MG tablet Take 500-1,000 mg by mouth every 6 (six) hours as needed for headache or mild pain.   Yes [provider]  midodrine (PROAMATINE) 2.5 MG tablet Take 1 tablet by mouth daily. Patient not taking: Reported on 05/07/2023 05/01/22    [provider]    Inpatient Medications: Scheduled Meds:  losartan  25 mg Oral Daily   Continuous Infusions:  PRN Meds:  Allergies:   No Known Allergies  Social History:   Social History   Tobacco Use   Smoking status: Never   Smokeless tobacco: Never  Substance Use Topics   Alcohol use: Yes    Alcohol/week: 24.0 standard drinks of alcohol    Types: 24 Cans of beer per week    Comment: "a little bit...no liquor"     Family History:    Family History  Problem Relation Age of Onset   Ovarian cancer Mother    Cancer - Colon Father    Prostate cancer Father    Brain cancer Sister    Sickle cell anemia Brother      ROS:  Review of Systems  Cardiovascular:  Positive for syncope. Negative for chest pain, dyspnea on exertion and leg swelling.  Neurological:  Positive for dizziness.    Physical Exam    Vitals:   05/07/23 1248 05/07/23 1530 05/07/23 1638 05/07/23 1758  BP:  (!) 182/109  (!) 163/115  Pulse:  77  87  Resp:  13  16  Temp:   98.2 F (36.8 C) 97.6 F (36.4 C)  TempSrc:   Oral Oral  SpO2:  100%  100%  Weight: 63 kg     Height: 5\' 6"  (1.676 m)      Orthostatic VS for the past 72 hrs (Last 3 readings):  BP Location  05/07/23 1758 Left Arm  05/07/23 1236 Left Arm    Physical Exam Neck:     Vascular: No carotid bruit or JVD.  Cardiovascular:     Rate and Rhythm: Normal rate and regular rhythm.     Pulses: Intact distal pulses.     Heart sounds: Normal heart sounds. No murmur heard.    No gallop.  Pulmonary:     Effort: Pulmonary effort is normal.     Breath sounds: Normal breath sounds.  Abdominal:     General: Bowel sounds are normal.     Palpations: Abdomen is soft.  Musculoskeletal:     Right lower leg: No edema.     Left lower leg: No edema.     No intake or output data in the 24 hours ending 05/07/23 1841    05/07/2023   12:48 PM 04/25/2023    3:17 PM 01/24/2023    7:20 PM  Last 3 Weights  Weight (lbs) 139 lb 132 lb  0.9 oz 132 lb  Weight (kg) 63.05 kg 59.9 kg 59.875 kg     Net IO Since Admission: No IO data has been entered for this period [05/07/23 1841]  Tele/EKG/Cardiac studies    @TODAY @ S. Tachy.  - Personally Reviewed EKG:  EKG 05/07/2023: Normal sinus rhythm with rate of 96 bpm, left anterior fascicular block.  Poor R progression, cannot exclude anteroseptal infarct 4.  No evidence of ischemia, normal  QT interval.  No significant change from 04/25/2023.  Carotid artery duplex ([facility]) 08/09/2022: Right Carotid: The right distal common carotid artery has moderate  heterogeneous plaque with <50% stenosis.    Right Carotid: The right proximal internal carotid artery has moderate heterogeneous plaque with <50% stenosis.    Right Carotid: The right vertebral artery demonstrates antegrade flow.    Right Carotid: The right external carotid artery has moderate heterogeneous plaque with <50% stenosis.    Left Carotid: The left distal common carotid artery has moderate to severe heterogeneous plaque with <50% stenosis.    Left Carotid: The left proximal internal carotid artery has moderate to severe heterogeneous plaque with <50% stenosis.    Left Carotid: The left external carotid artery has moderate heterogeneous plaque with <50% stenosis.    Left Carotid: The left vertebral artery demonstrates antegrade flow.  Echocardiogram 04/06/2022:    1. Left ventricular ejection fraction, by estimation, is 60 to 65%. The left ventricle has normal function. The left ventricle has no regional wall motion abnormalities. Left ventricular diastolic parameters are consistent with Grade I diastolic  dysfunction (impaired relaxation).  2. Right ventricular systolic function is normal. The right ventricular size is normal. There is normal pulmonary artery systolic pressure.  3. The mitral valve is normal in structure. Trivial mitral valve regurgitation. No evidence of mitral stenosis.  4. The aortic valve is tricuspid.  Aortic valve regurgitation is not visualized. No aortic stenosis is present.  5. The inferior vena cava is normal in size with greater than 50% respiratory variability, suggesting right atrial pressure of 3 mmHg.  Radiology  Reviewed  Labs   Lab Results  Component Value Date   NA 132 (L) 05/07/2023   K 3.1 (L) 05/07/2023   CO2 24 05/07/2023   GLUCOSE 111 (H) 05/07/2023   BUN 12 05/07/2023   CREATININE 1.26 (H) 05/07/2023   CALCIUM 9.0 05/07/2023   GFRNONAA >60 05/07/2023       Latest Ref Rng & Units 05/07/2023    1:05 PM 04/25/2023    3:22 PM 01/24/2023    7:56 PM  BMP  Glucose 70 - 99 mg/dL 093  818  299   BUN 8 - 23 mg/dL 12  <5  6   Creatinine 0.61 - 1.24 mg/dL 3.71  6.96  7.89   Sodium 135 - 145 mmol/L 132  135  132   Potassium 3.5 - 5.1 mmol/L 3.1  3.6  3.3   Chloride 98 - 111 mmol/L 98  96  97   CO2 22 - 32 mmol/L 24  24  17    Calcium 8.9 - 10.3 mg/dL 9.0  9.3  8.7       Latest Ref Rng & Units 05/07/2023    1:05 PM 04/25/2023    3:22 PM 01/24/2023    7:56 PM  CBC  WBC 4.0 - 10.5 K/uL 4.2  2.7  3.2   Hemoglobin 13.0 - 17.0 g/dL 38.1  01.7  51.0   Hematocrit 39.0 - 52.0 % 35.5  42.1  42.1   Platelets 150 - 400 K/uL 111  138  108    Lab Results  Component Value Date   TSH 4.565 (H) 04/06/2022   Lab Results  Component Value Date   HGBA1C 5.1 03/27/2015   High Sensitivity Troponin:   Recent Labs  Lab 05/07/23 1305 05/07/23 1505  TROPONINIHS 4 4     Cardiac Panel (last 3 results) Recent Labs    05/07/23 1305 05/07/23 1505  TROPONINIHS 4 4   DDimer No results for input(s): "DDIMER" in the last 168 hours.  Assessment & Plan .     1.  Recurrent syncope secondary to severe orthostatic hypotension.  Patient dropped his blood pressure 189/114 mmHg supine to 120/87 mmHg standing.  Do not suspect significant arrhythmias or heart block. 2.  Supine hypertension or orthostatic hypotension 3. History of excess alcohol use although patient denies excessive  alcohol use.  Recommendations:  Extremely difficult condition to treat in view of supine hypertension and orthostatic hypotension.  His recurrent episodes of syncope all have occurred on standing position and never in supine or sitting position again suggesting orthostasis to be the main etiology.  There is no clinical evidence of heart failure, do not suspect amyloidosis, infiltrative heart disease.  Etiology for severe orthostatic hypotension needs to be worked up.  He has had an event monitor placed by [facility], I tried to look on Care Everywhere, no results are present at this time, he was supposed to have a visit soon with them.  Do not think he needs repeat echocardiogram, do not think he needs ischemic workup at this time.   Yates Decamp, MD, [facility] 05/07/2023, 6:41 PM [facility] [address] [place], [place] [address] Phone: [phone]. Fax:  [phone]  C: [phone]

## 2023-05-08 ENCOUNTER — Observation Stay (HOSPITAL_COMMUNITY): Payer: No Typology Code available for payment source

## 2023-05-08 DIAGNOSIS — I10 Essential (primary) hypertension: Secondary | ICD-10-CM | POA: Diagnosis not present

## 2023-05-08 DIAGNOSIS — I951 Orthostatic hypotension: Secondary | ICD-10-CM

## 2023-05-08 DIAGNOSIS — R55 Syncope and collapse: Secondary | ICD-10-CM | POA: Diagnosis not present

## 2023-05-08 DIAGNOSIS — R079 Chest pain, unspecified: Secondary | ICD-10-CM | POA: Diagnosis not present

## 2023-05-08 LAB — BASIC METABOLIC PANEL
Anion gap: 9 (ref 5–15)
BUN: 11 mg/dL (ref 8–23)
CO2: 26 mmol/L (ref 22–32)
Calcium: 9.3 mg/dL (ref 8.9–10.3)
Chloride: 100 mmol/L (ref 98–111)
Creatinine, Ser: 1.09 mg/dL (ref 0.61–1.24)
GFR, Estimated: >60 mL/min (ref >60)
Glucose, Bld: 97 mg/dL (ref 70–99)
Potassium: 3.4 mmol/L — ABNORMAL LOW (ref 3.5–5.1)
Sodium: 135 mmol/L (ref 135–145)

## 2023-05-08 LAB — CBC
HCT: 34.9 % — ABNORMAL LOW (ref 39.0–52.0)
Hemoglobin: 12.9 g/dL — ABNORMAL LOW (ref 13.0–17.0)
MCH: 37.7 pg — ABNORMAL HIGH (ref 26.0–34.0)
MCHC: 37 g/dL — ABNORMAL HIGH (ref 30.0–36.0)
MCV: 102 fL — ABNORMAL HIGH (ref 80.0–100.0)
Platelets: 106 10*3/uL — ABNORMAL LOW (ref 150–400)
RBC: 3.42 MIL/uL — ABNORMAL LOW (ref 4.22–5.81)
RDW: 13.2 % (ref 11.5–15.5)
WBC: 3.6 10*3/uL — ABNORMAL LOW (ref 4.0–10.5)
nRBC: 0 % (ref 0.0–0.2)

## 2023-05-08 LAB — LIPID PANEL
Cholesterol: 118 mg/dL (ref 0–200)
HDL: 37 mg/dL — ABNORMAL LOW (ref >40)
LDL Cholesterol: 67 mg/dL (ref 0–99)
Total CHOL/HDL Ratio: 3.2 {ratio}
Triglycerides: 69 mg/dL (ref <150)
VLDL: 14 mg/dL (ref 0–40)

## 2023-05-08 LAB — HIV ANTIBODY (ROUTINE TESTING W REFLEX): HIV Screen 4th Generation wRfx: NONREACTIVE

## 2023-05-08 MED ORDER — MAGNESIUM SULFATE 4 GM/100ML IV SOLN
4.0000 g | Freq: Once | INTRAVENOUS | Status: AC
  Administered 2023-05-08: 4 g via INTRAVENOUS
  Filled 2023-05-08: qty 100

## 2023-05-08 MED ORDER — MIDODRINE HCL 5 MG PO TABS
5.0000 mg | ORAL_TABLET | Freq: Two times a day (BID) | ORAL | Status: DC
  Administered 2023-05-08 – 2023-05-09 (×3): 5 mg via ORAL
  Filled 2023-05-08 (×3): qty 1

## 2023-05-08 MED ORDER — LOSARTAN POTASSIUM 25 MG PO TABS: 12.5000 mg | ORAL_TABLET | Freq: Every day | ORAL | Status: DC

## 2023-05-08 MED ORDER — BISACODYL 10 MG RE SUPP
10.0000 mg | Freq: Once | RECTAL | Status: AC
  Administered 2023-05-08: 10 mg via RECTAL
  Filled 2023-05-08: qty 1

## 2023-05-08 MED ORDER — POTASSIUM CHLORIDE CRYS ER 20 MEQ PO TBCR
40.0000 meq | EXTENDED_RELEASE_TABLET | Freq: Once | ORAL | Status: AC
  Administered 2023-05-08: 40 meq via ORAL
  Filled 2023-05-08: qty 2

## 2023-05-08 MED ORDER — MIDODRINE HCL 5 MG PO TABS: 5.0000 mg | ORAL_TABLET | ORAL | Status: DC | PRN

## 2023-05-08 NOTE — Evaluation (Signed)
Physical Therapy Evaluation Patient Details Name: Jeremy Hayden MRN: [id number] DOB: 1956/07/15 Today's Date: 05/08/2023  History of Present Illness  Pt is a 67 y.o. male who presented 05/07/23 for chest pain, nausea, and dizziness. Troponin negative.  EKG is normal sinus rhythm without new EKG changes noted. Pt found to have orthostatic hypotension. PMH: CAD status post PCI and stent placement in 2013, prostate cancer, GERD, HTN   Clinical Impression  Pt presents with condition above and deficits mentioned below, see PT Problem List. PTA, he was independent without DME, living alone in a 1-level apartment with 1 STE. Currently, pt displays deficits in activity tolerance and balance. He moves slowly and cautiously, likely due to some anxiety in regards to his symptomatic orthostatic hypotension. He verbalized concern about going home today and expressed he would have more family in town available to assist him starting tomorrow. Notified RN. Educated pt on changing positions slowly, pausing and waiting with each positional change and moving legs as needed, wearing schedule for TED hose and abdominal binder, how to donn TED hose, use of 3in1 as bedside commode for bathroom needs at night, keeping phone or life alert necklace on at all times, use of rollator as needed when he gets symptomatic (more in community) and safe application of brakes with transfers, and signs/symptoms of orthostatic hypotension and to sit or lay down if symptoms come on or worsen. Pt verbalized understanding. Recommending follow-up with HHPT to assist in reducing pt's risk for falls. Will continue to follow acutely.   Prior to session RN measured pt's orthostatics, which were: 131/88 supine 129/95 sitting 87/70 standing 91/70 standing ~3 min  *pt reports being symptomatic   TED hose arrived and were donned at start of session. Orthostatics with TED hose donned were:  147/97 sitting 95/79 standing 102/81 standing and  moving legs in place ~3 min 123/96 ambulating 127/94 sitting after gait bout  *pt reported mild lightheadedness initially upon standing, but it improved with increased standing mobility     If plan is discharge home, recommend the following: Assistance with cooking/housework;Assist for transportation   Can travel by private vehicle        Equipment Recommendations Rollator (4 wheels);BSC/3in1  Recommendations for Other Services       Functional Status Assessment Patient has had a recent decline in their functional status and demonstrates the ability to make significant improvements in function in a reasonable and predictable amount of time.     Precautions / Restrictions Precautions Precautions: Fall;Other (comment) Precaution Comments: watch BP, TED hose bil Restrictions Weight Bearing Restrictions: No      Mobility  Bed Mobility Overal bed mobility: Modified Independent             General bed mobility comments: HOB elevated, no assistance needed    Transfers Overall transfer level: Needs assistance Equipment used: None Transfers: Sit to/from Stand Sit to Stand: Contact guard assist           General transfer comment: CGA for safety coming to stand from EOB slowly    Ambulation/Gait Ambulation/Gait assistance: Contact guard assist Gait Distance (Feet): 240 Feet Assistive device: None Gait Pattern/deviations: Step-through pattern, Decreased step length - right, Decreased step length - left, Decreased stride length Gait velocity: reduced Gait velocity interpretation: 1.31 - 2.62 ft/sec, indicative of limited community ambulator   General Gait Details: Pt takes slow, small, cautious steps. No overt LOB, CGA for safety  Stairs  Wheelchair Mobility     Tilt Bed    Modified Rankin (Stroke Patients Only)       Balance Overall balance assessment: Mild deficits observed, not formally tested                                            Pertinent Vitals/Pain Pain Assessment Pain Assessment: Faces Faces Pain Scale: No hurt Pain Intervention(s): Monitored during session    Home Living Family/patient expects to be discharged to:: Private residence Living Arrangements: Alone Available Help at Discharge: Family;Available 24 hours/day (likely starting tomorrow as pt reports not much family available today but more coming to town tomorrow) Type of Home: Apartment Home Access: Stairs to enter Entrance Stairs-Rails: Right Entrance Stairs-Number of Steps: 1   Home Layout: One level Home Equipment: None      Prior Function Prior Level of Function : Independent/Modified Independent             Mobility Comments: No AD, hx of falls (seemingly due to orthostatic hypotension) ADLs Comments: Does not drive, brother provides transportation to pt; pt sometimes walks to stores     Extremity/Trunk Assessment   Upper Extremity Assessment Upper Extremity Assessment: Defer to OT evaluation    Lower Extremity Assessment Lower Extremity Assessment: Overall WFL for tasks assessed    Cervical / Trunk Assessment Cervical / Trunk Assessment: Normal  Communication   Communication Communication: No apparent difficulties  Cognition Arousal: Alert Behavior During Therapy: Flat affect Overall Cognitive Status: Within Functional Limits for tasks assessed                                 General Comments: Pt with flat affect, appears to be a little worried about going home alone at this time. Notified RN that pt reports family will be here tomorrow        General Comments General comments (skin integrity, edema, etc.): Prior to session RN measured pt's orthostatics, which were 131/88 supine, 129/95 sitting, 87/70 standing, 91/70 standing ~3 min, pt reports being symptomatic; TED hose arrived and were donned at start of session. Orthostatics with TED hose donned were 147/97 sitting, 95/79  standing, 102/81 standing and moving legs in place ~3 min, 123/96 ambulating, 127/94 sitting after gait bout; pt reported mild lightheadedness initially upon standing, but it improved with increased standing mobility; educated pt on changing positions slowly, pausing and waiting with each positional change and moving legs as needed, wearing schedule for TED hose and abdominal binder, how to donn TED hose, use of 3in1 as bedside commode for bathroom needs at night, keeping phone or life alert necklace on at all times, use of rollator as needed when he gets symptomatic (more in community) and safe application of brakes with transfers, and signs/symptoms of orthostatic hypotension and to sit or lay down if symptoms come on or worsen    Exercises     Assessment/Plan    PT Assessment Patient needs continued PT services  PT Problem List Decreased activity tolerance;Decreased balance;Decreased mobility;Decreased knowledge of precautions;Cardiopulmonary status limiting activity       PT Treatment Interventions Gait training;DME instruction;Stair training;Functional mobility training;Therapeutic activities;Therapeutic exercise;Balance training;Neuromuscular re-education;Patient/family education    PT Goals (Current goals can be found in the Care Plan section)  Acute Rehab PT Goals Patient Stated Goal: to be safe  at home PT Goal Formulation: With patient Time For Goal Achievement: 05/22/23 Potential to Achieve Goals: Good    Frequency Min 1X/week     Co-evaluation               AM-PAC PT "6 Clicks" Mobility  Outcome Measure Help needed turning from your back to your side while in a flat bed without using bedrails?: None Help needed moving from lying on your back to sitting on the side of a flat bed without using bedrails?: None Help needed moving to and from a bed to a chair (including a wheelchair)?: A Little Help needed standing up from a chair using your arms (e.g., wheelchair or  bedside chair)?: A Little Help needed to walk in hospital room?: A Little Help needed climbing 3-5 steps with a railing? : A Little 6 Click Score: 20    End of Session Equipment Utilized During Treatment: Gait belt Activity Tolerance: Patient tolerated treatment well Patient left: in bed;with call bell/phone within reach;with bed alarm set Nurse Communication: Mobility status;Other (comment) (BP, pt concerned about d/c home today but family to be there tomorrow, abdominal binder needs) PT Visit Diagnosis: Unsteadiness on feet (R26.81);Other abnormalities of gait and mobility (R26.89);History of falling (Z91.81);Dizziness and giddiness (R42)    Time: 1610-9604 PT Time Calculation (min) (ACUTE ONLY): 41 min   Charges:   PT Evaluation $PT Eval Low Complexity: 1 Low PT Treatments $Therapeutic Activity: 23-37 mins PT General Charges $$ ACUTE PT VISIT: 1 Visit         Virgil Benedict, PT, DPT Acute Rehabilitation Services  Office: [phone]   Bettina Gavia 05/08/2023, 1:51 PM

## 2023-05-08 NOTE — Evaluation (Signed)
Occupational Therapy Evaluation Patient Details Name: Jeremy Hayden MRN: [id number] DOB: 1955-07-27 Today's Date: 05/08/2023   History of Present Illness Pt is a 67 y.o. male who presented 05/07/23 for chest pain, nausea, and dizziness. Troponin negative.  EKG is normal sinus rhythm without new EKG changes noted. Pt found to have orthostatic hypotension. PMH: CAD status post PCI and stent placement in 2013, prostate cancer, GERD, HTN   Clinical Impression   PTA, pt lived alone and was mod I for ADL and IADl; family provided transportation. Upon eval, pt presents with decreased activity tolerance, balance, safety, attention, and knowledge of use of compensatory techniques. Pt performing UB ADL with set-up and LB ADL with CGA-min guard A (for compression stockings). Pt with decreased knowledge of how to self monitor and manage symptoms of dizziness. Pt did mention more family would be in town tomorrow and that he has concerns regarding going home alone; RN is aware. Recommending discharge home with HHOT.       If plan is discharge home, recommend the following: A little help with walking and/or transfers;A little help with bathing/dressing/bathroom;Help with stairs or ramp for entrance;Assist for transportation;Assistance with cooking/housework    Functional Status Assessment  Patient has had a recent decline in their functional status and demonstrates the ability to make significant improvements in function in a reasonable and predictable amount of time.  Equipment Recommendations  BSC/3in1    Recommendations for Other Services       Precautions / Restrictions Precautions Precautions: Fall;Other (comment) Precaution Comments: watch BP, TED hose bil, watch for orders for binder Restrictions Weight Bearing Restrictions: No      Mobility Bed Mobility Overal bed mobility: Modified Independent             General bed mobility comments: HOB elevated, no assistance needed     Transfers Overall transfer level: Needs assistance Equipment used: None Transfers: Sit to/from Stand Sit to Stand: Contact guard assist           General transfer comment: CGA for safety coming to stand from EOB slowly      Balance Overall balance assessment: Mild deficits observed, not formally tested                                         ADL either performed or assessed with clinical judgement   ADL Overall ADL's : Needs assistance/impaired Eating/Feeding: Sitting;Modified independent   Grooming: Oral care;Contact guard assist;Standing   Upper Body Bathing: Set up;Sitting   Lower Body Bathing: Contact guard assist;Sit to/from stand   Upper Body Dressing : Set up;Sitting   Lower Body Dressing: Contact guard assist;Sit to/from stand Lower Body Dressing Details (indicate cue type and reason): Pt demo socks and shoes but PT reported that while donning compression garments, needed min A Toilet Transfer: Contact guard assist           Functional mobility during ADLs: Contact guard assist General ADL Comments: Pt reports no symptoms that he could identify prior to syncopal episodes, however, pt with dizziness throughout session and BLE tremor with fatigue. Educated regarding self mobnitoring of symptoms, compensatory techniques for LB ADL, showers, etc.     Vision Ability to See in Adequate Light: 0 Adequate Patient Visual Report: No change from baseline Vision Assessment?: No apparent visual deficits Additional Comments: WFL for BADL assessed     Perception Perception: Not tested  Praxis Praxis: Not tested       Pertinent Vitals/Pain Pain Assessment Pain Assessment: Faces Faces Pain Scale: No hurt Pain Intervention(s): Limited activity within patient's tolerance, Monitored during session     Extremity/Trunk Assessment Upper Extremity Assessment Upper Extremity Assessment: Generalized weakness;Overall WFL for tasks assessed    Lower Extremity Assessment Lower Extremity Assessment: Defer to PT evaluation   Cervical / Trunk Assessment Cervical / Trunk Assessment: Normal   Communication Communication Communication: No apparent difficulties   Cognition Arousal: Alert Behavior During Therapy: Flat affect Overall Cognitive Status: No family/caregiver present to determine baseline cognitive functioning                                 General Comments: Pt with flat affect, appears to be a little worried about going home alone at this time. Notified RN that pt reports family will be here tomorrow. Pt exhibiting some level of being internally distracted throughout with intermittently poor eye contact with therapist during education     General Comments  Pt BP 95/78 standing EOB; after being up for several minuutes, 120/85    Exercises     Shoulder Instructions      Home Living Family/patient expects to be discharged to:: Private residence Living Arrangements: Alone Available Help at Discharge: Family;Available 24 hours/day (likely starting tomorrow as pt reports not much family available today but more coming to town tomorrow) Type of Home: Apartment Home Access: Stairs to enter Secretary/administrator of Steps: 1 Entrance Stairs-Rails: Right Home Layout: One level     Bathroom Shower/Tub: Chief Strategy Officer: Handicapped height     Home Equipment: None          Prior Functioning/Environment Prior Level of Function : Independent/Modified Independent             Mobility Comments: No AD, hx of falls (seemingly due to orthostatic hypotension) ADLs Comments: Does not drive, brother or nephew provides transportation to pt; pt sometimes walks to stores        OT Problem List: Decreased strength;Impaired balance (sitting and/or standing);Decreased activity tolerance;Decreased safety awareness;Decreased knowledge of use of DME or AE;Cardiopulmonary status limiting  activity      OT Treatment/Interventions: Self-care/ADL training;Therapeutic exercise;DME and/or AE instruction;Therapeutic activities;Cognitive remediation/compensation;Patient/family education;Balance training    OT Goals(Current goals can be found in the care plan section) Acute Rehab OT Goals Patient Stated Goal: get better OT Goal Formulation: With patient Time For Goal Achievement: 05/22/23 Potential to Achieve Goals: Good  OT Frequency: Min 1X/week    Co-evaluation              AM-PAC OT "6 Clicks" Daily Activity     Outcome Measure Help from another person eating meals?: None Help from another person taking care of personal grooming?: A Little Help from another person toileting, which includes using toliet, bedpan, or urinal?: A Little Help from another person bathing (including washing, rinsing, drying)?: A Little Help from another person to put on and taking off regular upper body clothing?: A Little Help from another person to put on and taking off regular lower body clothing?: A Little 6 Click Score: 19   End of Session Equipment Utilized During Treatment: Gait belt Nurse Communication: Mobility status  Activity Tolerance: Patient tolerated treatment well Patient left: in bed;with call bell/phone within reach;with bed alarm set  OT Visit Diagnosis: Unsteadiness on feet (R26.81);Muscle weakness (generalized) (M62.81);Repeated falls (R29.6);History of  falling (Z91.81)                Time: 1331-1400 OT Time Calculation (min): 29 min Charges:  OT General Charges $OT Visit: 1 Visit OT Evaluation $OT Eval Low Complexity: 1 Low OT Treatments $Self Care/Home Management : 8-22 mins  Tyler Deis, OTR/L [facility] Acute Rehabilitation Office: [phone]   Myrla Halsted 05/08/2023, 2:28 PM

## 2023-05-08 NOTE — Care Management (Cosign Needed)
Patient needs bedside commode due to inability to walk greater than 100 feet.   Vance Peper, RN CM

## 2023-05-08 NOTE — Progress Notes (Signed)
PROGRESS NOTE    Jeremy Hayden  WUJ:[id number] DOB: [birth year] DOA: 05/07/2023 PCP: [facility]   Brief Narrative: No notes on file   Assessment and Plan:  Chest pain Somewhat atypical, although symptoms did improve with nitroglycerin and patient reports that his pain is similar to how he felt when he had his last need for heart catheterization with stent placement.  Requested cardiology consult by EDP.  Patient reports having an echocardiogram performed within the last month but is unsure of results. Currently chest pain free with no recurrence.  Troponin negative.  EKG is normal sinus rhythm without new EKG changes noted. No inpatient ischemic workup per cardiology.   Syncope Orthostatic hypotension History of workup consistent with orthostatic hypotension. Complicated by supine hypertension. Cardiology recommendations to discontinue antihypertensives and have started midodrine 5 mg BID. Patient is still symptomatic from orthostatic hypotension. -Cardiology: midodrine, increased salt intake, TED hose -PT/OT evaluation   Asymptomatic uncontrolled hypertension Chest pain does not appear to be related to hypertension as he continues to have significant hypertension without continued chest pain symptoms.  Patient reports that he has had uncontrolled hypertension at baseline.  He currently takes losartan 12.5 mg daily. Losartan discontinued.   Elevated creatinine Baseline creatinine of about 1.  Creatinine of 1.26 on admission. Improved.   Hypokalemia Potassium of 3.1 on admission. Potassium supplementation given. Potassium of 3.4 today. -Potassium supplementation  Hypomagnesemia Magnesium of 1.6 -Magnesium supplementation   GERD -Protonix while inpatient  Abdominal pain Non-specific. No associated symptoms. Abdominal x-ray obtained and is without signs of obstruction. -Dulcolax suppository x1   DVT prophylaxis: Lovenox Code Status:   Code Status: Full  Code Family Communication: None at bedside Disposition Plan: Discharge home likely in 24 hours if symptoms from orthostatic hypotension have improved.   Consultants:  Cardiology  Procedures:  None  Antimicrobials: None    Subjective: Patient reports some abdominal pain.  Objective: BP 131/88 (BP Location: Left Arm)   Pulse 83   Temp 98.2 F (36.8 C) (Oral)   Resp 13   Ht 5\' 6"  (1.676 m)   Wt 63 kg   SpO2 100%   BMI 22.44 kg/m   Examination:  General exam: Appears calm and comfortable Respiratory system: Clear to auscultation. Respiratory effort normal. Cardiovascular system: S1 & S2 heard, RRR. No murmurs. Gastrointestinal system: Abdomen is nondistended, soft and mildly tender. No organomegaly or masses felt. Normal bowel sounds heard. Central nervous system: Alert and oriented. No focal neurological deficits. Musculoskeletal: No edema. No calf tenderness Skin: No cyanosis. No rashes Psychiatry: Judgement and insight appear normal. Mood & affect appropriate.    Data Reviewed: I have personally reviewed following labs and imaging studies  CBC Lab Results  Component Value Date   WBC 3.6 (L) 05/08/2023   RBC 3.42 (L) 05/08/2023   HGB 12.9 (L) 05/08/2023   HCT 34.9 (L) 05/08/2023   MCV 102.0 (H) 05/08/2023   MCH 37.7 (H) 05/08/2023   PLT 106 (L) 05/08/2023   MCHC 37.0 (H) 05/08/2023   RDW 13.2 05/08/2023   LYMPHSABS 0.6 (L) 12/03/2022   MONOABS 0.4 12/03/2022   EOSABS 0.0 12/03/2022   BASOSABS 0.0 12/03/2022     Last metabolic panel Lab Results  Component Value Date   NA 135 05/08/2023   K 3.4 (L) 05/08/2023   CL 100 05/08/2023   CO2 26 05/08/2023   BUN 11 05/08/2023   CREATININE 1.09 05/08/2023   GLUCOSE 97 05/08/2023   GFRNONAA >60 05/08/2023  GFRAA >60 01/18/2019   CALCIUM 9.3 05/08/2023   PHOS 2.4 (L) 03/27/2015   PROT 7.2 04/25/2023   ALBUMIN 3.5 04/25/2023   BILITOT 1.1 04/25/2023   ALKPHOS 87 04/25/2023   AST 103 (H) 04/25/2023    ALT 28 04/25/2023   ANIONGAP 9 05/08/2023    GFR: Estimated Creatinine Clearance: 57.9 mL/min (by C-G formula based on SCr of 1.09 mg/dL).  No results found for this or any previous visit (from the past 240 hour(s)).    Radiology Studies: DG Abd Portable 1V  Result Date: 05/08/2023 CLINICAL DATA:  Abdominal pain. EXAM: PORTABLE ABDOMEN - 1 VIEW COMPARISON:  CT abdomen and pelvis 01/18/2019 FINDINGS: Gas is present in multiple nondilated loops of small bowel throughout the abdomen as well as in the colon and rectum. There is no evidence of bowel obstruction. No abnormal abdominal calcification is evident. Small pelvic calcifications are most compatible with phleboliths. No acute osseous abnormality is seen. IMPRESSION: Nonobstructive bowel gas pattern. Electronically Signed   By: Sebastian Ache M.D.   On: 05/08/2023 12:42   DG Chest 2 View  Result Date: 05/07/2023 CLINICAL DATA:  Central chest pain for 1 day. EXAM: CHEST - 2 VIEW COMPARISON:  12/03/2022. FINDINGS: Bilateral lung fields are clear. Bilateral costophrenic angles are clear. Normal cardio-mediastinal silhouette. No acute osseous abnormalities. The soft tissues are within normal limits. IMPRESSION: *No active cardiopulmonary disease. Electronically Signed   By: Jules Schick M.D.   On: 05/07/2023 16:02      LOS: 0 days    Jacquelin Hawking, MD [facility] 05/08/2023, 2:27 PM   If 7PM-7AM, please contact night-coverage [email or web address]

## 2023-05-08 NOTE — Progress Notes (Signed)
   Patient Name: Jeremy Hayden Date of Encounter: 05/08/2023 [facility] Cardiologist: None   Interval Summary  .    No chest pain, feels nauseated. Significant drop in BP this morning with orthostatics   Vital Signs .    Vitals:   05/07/23 1936 05/08/23 0018 05/08/23 0413 05/08/23 0743  BP: (!) 149/99 137/88 (!) 150/90 (!) 158/105  Pulse: 87 92 88 88  Resp: 18 16 18 18   Temp: 97.8 F (36.6 C) 97.6 F (36.4 C) 97.8 F (36.6 C) 98 F (36.7 C)  TempSrc: Oral Oral Oral Oral  SpO2: 100% 98% 98% 99%  Weight:      Height:       No intake or output data in the 24 hours ending 05/08/23 0835    05/07/2023   12:48 PM 04/25/2023    3:17 PM 01/24/2023    7:20 PM  Last 3 Weights  Weight (lbs) 139 lb 132 lb 0.9 oz 132 lb  Weight (kg) 63.05 kg 59.9 kg 59.875 kg      Telemetry/ECG    Sinus Rhythm - Personally Reviewed  Physical Exam .   GEN: No acute distress.   Neck: No JVD Cardiac: RRR, no murmurs, rubs, or gallops.  Respiratory: Clear to auscultation bilaterally. GI: Soft, nontender, non-distended  MS: No edema  Assessment & Plan .     67 year old male with history of hypertension who presented with syncope on 05/07/2023.   Syncope Orthostatic hypotension -- Difficult situation with significant orthostatic hypotension as noted on vital signs this morning with a drop in systolic blood pressure from 150-83 with standing.  Did receive losartan 25 mg last evening.  Will stop losartan this morning given significant drop in BP -- Unable to add midodrine given significant supine hypertension -- Will add TED hose and abdominal binder -- increase Na+ intake  -- suspect will have accept some degree of hypertension given his significant drops in BP           Chest pain -- sounds atypical in nature -- hsTn negative x2  Hypokalemia Hypomagnesemia -- Supplement K/mag  For questions or updates, please contact [facility] Please consult [email or web address] for  contact info under    Signed, Laverda Page, NP

## 2023-05-08 NOTE — TOC Progression Note (Signed)
Transition of Care [facility]) - Progression Note    Patient Details  Name: Jeremy Hayden MRN: [id number] Date of Birth: 10-13-1955  [facility] CM/SW Contact  Huston Foley Jacklynn Ganong, RN Phone Number: 05/08/2023, 4:43 PM  Clinical Narrative:     Case manager spoke with patient concerning need for Home Health and DME. Patient goes to [facility], [employer] unit, not sure of PCP. [facility] Delila Pereyra, SW. Referral for HHPT called to [facility], Lorenza Chick. DME to be delivered to patient's room by [employer].    Expected Discharge Plan: Home w Home Health Services Barriers to Discharge: Continued Medical Work up  Expected Discharge Plan and Services   Discharge Planning Services: CM Consult Post Acute Care Choice: Durable Medical Equipment, Home Health Living arrangements for the past 2 months: Apartment                 DME Arranged: 3-N-1, Walker rolling with seat DME Agency: [employer] Date DME Agency Contacted: 05/08/23 Time DME Agency Contacted: [phone] Representative spoke with at DME Agency: Mitch HH Arranged: PT HH Agency: [facility] Date [facility] Agency Contacted: 05/08/23 Time HH Agency Contacted: 1615 Representative spoke with at [facility] Agency: Kandee Keen   Social Determinants of Health (SDOH) Interventions SDOH Screenings   Food Insecurity: No Food Insecurity (05/07/2023)  Housing: Low Risk  (05/07/2023)  Transportation Needs: No Transportation Needs (05/07/2023)  Utilities: Not At Risk (05/07/2023)  Social Connections: Unknown (03/01/2022)   Received from [facility]  Tobacco Use: Low Risk  (05/07/2023)    Readmission Risk Interventions    04/08/2022   10:34 AM  Readmission Risk Prevention Plan  Post Dischage Appt Complete  Medication Screening Complete  Transportation Screening Complete

## 2023-05-08 NOTE — Plan of Care (Signed)

## 2023-05-09 DIAGNOSIS — I10 Essential (primary) hypertension: Secondary | ICD-10-CM | POA: Diagnosis not present

## 2023-05-09 DIAGNOSIS — R55 Syncope and collapse: Secondary | ICD-10-CM | POA: Diagnosis not present

## 2023-05-09 DIAGNOSIS — R079 Chest pain, unspecified: Secondary | ICD-10-CM | POA: Diagnosis not present

## 2023-05-09 LAB — T4: T4, Total: 7.5 ug/dL (ref 4.5–12.0)

## 2023-05-09 LAB — T3, FREE: T3, Free: 2.7 pg/mL (ref 2.0–4.4)

## 2023-05-09 MED ORDER — MIDODRINE HCL 5 MG PO TABS: ORAL_TABLET | ORAL | 0 refills | Status: DC

## 2023-05-09 NOTE — Plan of Care (Signed)
  Problem: Education: Goal: Knowledge of General Education information will improve Description: Including pain rating scale, medication(s)/side effects and non-pharmacologic comfort measures 05/09/2023 1153 by Balinda Quails, RN Outcome: Progressing 05/09/2023 1153 by Balinda Quails, RN Outcome: Adequate for Discharge   Problem: Health Behavior/Discharge Planning: Goal: Ability to manage health-related needs will improve 05/09/2023 1153 by Balinda Quails, RN Outcome: Progressing 05/09/2023 1153 by Balinda Quails, RN Outcome: Adequate for Discharge   Problem: Clinical Measurements: Goal: Ability to maintain clinical measurements within normal limits will improve 05/09/2023 1153 by Balinda Quails, RN Outcome: Progressing 05/09/2023 1153 by Balinda Quails, RN Outcome: Adequate for Discharge Goal: Will remain free from infection 05/09/2023 1153 by Balinda Quails, RN Outcome: Progressing 05/09/2023 1153 by Balinda Quails, RN Outcome: Adequate for Discharge Goal: Diagnostic test results will improve 05/09/2023 1153 by Balinda Quails, RN Outcome: Progressing 05/09/2023 1153 by Balinda Quails, RN Outcome: Adequate for Discharge Goal: Respiratory complications will improve 05/09/2023 1153 by Balinda Quails, RN Outcome: Progressing 05/09/2023 1153 by Balinda Quails, RN Outcome: Adequate for Discharge Goal: Cardiovascular complication will be avoided 05/09/2023 1153 by Balinda Quails, RN Outcome: Progressing 05/09/2023 1153 by Balinda Quails, RN Outcome: Adequate for Discharge   Problem: Activity: Goal: Risk for activity intolerance will decrease 05/09/2023 1153 by Balinda Quails, RN Outcome: Progressing 05/09/2023 1153 by Balinda Quails, RN Outcome: Adequate for Discharge   Problem: Nutrition: Goal: Adequate nutrition will be maintained 05/09/2023 1153 by Balinda Quails, RN Outcome: Progressing 05/09/2023 1153 by Balinda Quails, RN Outcome: Adequate for Discharge   Problem: Coping: Goal: Level of anxiety will decrease 05/09/2023 1153 by Balinda Quails, RN Outcome: Progressing 05/09/2023 1153 by Balinda Quails, RN Outcome: Adequate for Discharge   Problem: Elimination: Goal: Will not experience complications related to bowel motility 05/09/2023 1153 by Balinda Quails, RN Outcome: Progressing 05/09/2023 1153 by Balinda Quails, RN Outcome: Adequate for Discharge Goal: Will not experience complications related to urinary retention 05/09/2023 1153 by Balinda Quails, RN Outcome: Progressing 05/09/2023 1153 by Balinda Quails, RN Outcome: Adequate for Discharge   Problem: Pain Management: Goal: General experience of comfort will improve 05/09/2023 1153 by Balinda Quails, RN Outcome: Progressing 05/09/2023 1153 by Balinda Quails, RN Outcome: Adequate for Discharge   Problem: Safety: Goal: Ability to remain free from injury will improve 05/09/2023 1153 by Balinda Quails, RN Outcome: Progressing 05/09/2023 1153 by Balinda Quails, RN Outcome: Adequate for Discharge   Problem: Skin Integrity: Goal: Risk for impaired skin integrity will decrease 05/09/2023 1153 by Balinda Quails, RN Outcome: Progressing 05/09/2023 1153 by Balinda Quails, RN Outcome: Adequate for Discharge

## 2023-05-09 NOTE — Progress Notes (Signed)
Physical Therapy Treatment Patient Details Name: Jeremy Hayden MRN: [id number] DOB: 10-12-1955 Today's Date: 05/09/2023   History of Present Illness Pt is a 67 y.o. male who presented 05/07/23 for chest pain, nausea, and dizziness. Troponin negative.  EKG is normal sinus rhythm without new EKG changes noted. Pt found to have orthostatic hypotension. PMH: CAD status post PCI and stent placement in 2013, prostate cancer, GERD, HTN    PT Comments  Pt greeted seated up EOB, pleasant and participatory in session. Pt personal rollator delivered to room prior to session and session focused on education on rollator features and safety with use. Pt demonstrating increased gait speed, distance and stability with rollator support with no LOB noted and pt without dizziness/lightheadedness and BP stable throughout. Pt was educated on continued rollator use to maximize functional independence, safety, and decrease risk for falls as well as continued ted hose and abdominal binder wear for BP stabilization with pt verbalizing understanding. Pt continues to benefit from skilled PT services to progress toward functional mobility goals.     If plan is discharge home, recommend the following: Assistance with cooking/housework;Assist for transportation   Can travel by Pension scheme manager (4 wheels);BSC/3in1    Recommendations for Other Services       Precautions / Restrictions Precautions Precautions: Fall;Other (comment) Precaution Comments: watch BP, TED hose bil, watch for orders for binder Restrictions Weight Bearing Restrictions: No     Mobility  Bed Mobility Overal bed mobility: Modified Independent             General bed mobility comments: pt seated up EOB on arrival    Transfers Overall transfer level: Needs assistance Equipment used: None Transfers: Sit to/from Stand Sit to Stand: Contact guard assist           General transfer comment:  CGA for safety coming to stand from EOB slowly    Ambulation/Gait Ambulation/Gait assistance: Contact guard assist, Supervision Gait Distance (Feet): 300 Feet (+10 in room without AD) Assistive device: Rollator (4 wheels), None Gait Pattern/deviations: Step-through pattern, Decreased step length - right, Decreased step length - left, Decreased stride length Gait velocity: reduced     General Gait Details: improved gait speed and stability with personal rollator in room   Stairs             Wheelchair Mobility     Tilt Bed    Modified Rankin (Stroke Patients Only)       Balance Overall balance assessment: Mild deficits observed, not formally tested                                          Cognition Arousal: Alert Behavior During Therapy: [facility] for tasks assessed/performed                                   General Comments: pt conversational and upbeat, without concerns about going home this date        Exercises      General Comments General comments (skin integrity, edema, etc.): pt brother in room on arrival, BP stable with pt ted hose and abdominal binder donned, eduated pt on all rollator features with pt demonstrateing understanding      Pertinent Vitals/Pain Pain Assessment Faces Pain Scale: No  hurt    Home Living                          Prior Function            PT Goals (current goals can now be found in the care plan section) Acute Rehab PT Goals Patient Stated Goal: to be safe at home PT Goal Formulation: With patient Time For Goal Achievement: 05/22/23 Progress towards PT goals: Progressing toward goals    Frequency    Min 1X/week      PT Plan      Co-evaluation              AM-PAC PT "6 Clicks" Mobility   Outcome Measure  Help needed turning from your back to your side while in a flat bed without using bedrails?: None Help needed moving from lying on your back to sitting on  the side of a flat bed without using bedrails?: None Help needed moving to and from a bed to a chair (including a wheelchair)?: A Little Help needed standing up from a chair using your arms (e.g., wheelchair or bedside chair)?: A Little Help needed to walk in hospital room?: A Little Help needed climbing 3-5 steps with a railing? : A Little 6 Click Score: 20    End of Session   Activity Tolerance: Patient tolerated treatment well Patient left: in bed;with call bell/phone within reach;with family/visitor present (seated up EOB) Nurse Communication: Mobility status;Other (comment) (pt eager for d/c and wanting to know timeline) PT Visit Diagnosis: Unsteadiness on feet (R26.81);Other abnormalities of gait and mobility (R26.89);History of falling (Z91.81);Dizziness and giddiness (R42)     Time: 0981-1914 PT Time Calculation (min) (ACUTE ONLY): 12 min  Charges:    $Gait Training: 8-22 mins PT General Charges $$ ACUTE PT VISIT: 1 Visit                     Shreeya Recendiz R. PTA Acute Rehabilitation Services Office: [phone]   Catalina Antigua 05/09/2023, 3:47 PM

## 2023-05-09 NOTE — Discharge Summary (Signed)
Physician Discharge Summary   Patient: Jeremy Hayden MRN: [id number] DOB: 03-Jul-1956  Admit date:     05/07/2023  Discharge date: 05/09/23  Discharge Physician: Jacquelin Hawking, MD   PCP: [facility]   Recommendations at discharge:  PCP follow-up  Discharge Diagnoses: Principal Problem:   Chest pain Active Problems:   Orthostatic hypotension   Supine hypertension   Syncope and collapse  Resolved Problems:   * No resolved hospital problems. *  Hospital Course: Jeremy Hayden is a 67 y.o. male with a history of CAD, prostate cancer, GERD, hypertension.  Patient presented secondary to chest pain and syncopal episode.  Chest pain responded to nitroglycerin.  Cardiology was consulted and evaluated patient.  No concern for ACS at this time.  Regarding syncope, patient was monitored on telemetry without severe arrhythmias noted.  During evaluation, patient was found to have evidence of symptomatic orthostatic hypotension.  Outpatient losartan was discontinued and patient started midodrine in addition to being prescribed TED hose and an abdominal binder.  Patient worked with PT and OT with recommendation for home health physical therapy..  Assessment and Plan:  Chest pain Somewhat atypical, although symptoms did improve with nitroglycerin and patient reports that his pain is similar to how he felt when he had his last need for heart catheterization with stent placement.  Requested cardiology consult by EDP.  Patient reports having an echocardiogram performed within the last month but is unsure of results. Currently chest pain free with no recurrence.  Troponin negative.  EKG is normal sinus rhythm without new EKG changes noted. No inpatient ischemic workup per cardiology.   Syncope Orthostatic hypotension History of workup consistent with orthostatic hypotension. Complicated by supine hypertension. Cardiology recommendations to discontinue antihypertensives and have started  midodrine 5 mg BID. Patient's symptoms improved. TED hose and abdominal binder provided.   Asymptomatic uncontrolled hypertension Chest pain does not appear to be related to hypertension as he continues to have significant hypertension without continued chest pain symptoms.  Patient reports that he has had uncontrolled hypertension at baseline.  He currently takes losartan 12.5 mg daily. Losartan discontinued.   Elevated creatinine Baseline creatinine of about 1.  Creatinine of 1.26 on admission. Improved.   Hypokalemia Potassium of 3.1 on admission. Potassium supplementation given.   Hypomagnesemia Magnesium supplementation given.   GERD Continue omeprazole.   Abdominal pain No ileus or obstruction seen on x-ray imaging. Improved.   Consultants: Cardiology Procedures performed: None  Disposition: Home health Diet recommendation: Regular diet   DISCHARGE MEDICATION: Allergies as of 05/09/2023   No Known Allergies      Medication List     STOP taking these medications    losartan 25 MG tablet Commonly known as: COZAAR       TAKE these medications    Cholecalciferol 125 MCG (5000 UT) capsule Take 5,000 Units by mouth daily.   fluticasone 50 MCG/ACT nasal spray Commonly known as: FLONASE Place 2 sprays into both nostrils daily as needed for allergies or rhinitis.   hydrocortisone 2.5 % rectal cream Commonly known as: ANUSOL-HC Place 1 application rectally 2 (two) times daily. What changed:  when to take this reasons to take this   midodrine 5 MG tablet Commonly known as: PROAMATINE Take 1 tablet (5 mg total) by mouth 2 (two) times daily. May also take 1 tablet (5 mg total) daily as needed (Take extra dose in late afternoon if on your feet to prevent). What changed:  medication strength See the new  instructions.   multivitamin with minerals tablet Take 1 tablet by mouth daily with breakfast.   omeprazole 20 MG capsule Commonly known as:  PRILOSEC Take 20 mg by mouth daily before breakfast.   sodium chloride 0.65 % Soln nasal spray Commonly known as: OCEAN Place 1 spray into both nostrils as needed for congestion.   thiamine 100 MG tablet Commonly known as: Vitamin B-1 Take 100 mg by mouth daily.   TYLENOL 500 MG tablet Generic drug: acetaminophen Take 500-1,000 mg by mouth every 6 (six) hours as needed for headache or mild pain.               Durable Medical Equipment  (From admission, onward)           Start     Ordered   05/08/23 1507  For home use only DME Other see comment  Once       Comments: Patient needs rollator, unable to ambulate greater than 50 feet without needing to rest  Question:  Length of Need  Answer:  Lifetime   05/08/23 1508   05/08/23 1505  For home use only DME Bedside commode  Once       Comments: Unable to ambulate greater than 100 ft. To bathrom  Question:  Patient needs a bedside commode to treat with the following condition  Answer:  Generalized weakness   05/08/23 1508            Discharge Exam: BP (!) 145/102 (BP Location: Left Arm)   Pulse 79   Temp 98 F (36.7 C) (Oral)   Resp 17   Ht 5\' 6"  (1.676 m)   Wt 63 kg   SpO2 100%   BMI 22.44 kg/m   General exam: Appears calm and comfortable Respiratory system: Clear to auscultation. Respiratory effort normal. Cardiovascular system: S1 & S2 heard, RRR. No murmurs, rubs, gallops or clicks. Gastrointestinal system: Abdomen is nondistended, soft and nontender. Normal bowel sounds heard. Central nervous system: Alert and oriented. No focal neurological deficits. Psychiatry: Judgement and insight appear normal. Mood & affect appropriate.   Condition at discharge: stable  The results of significant diagnostics from this hospitalization (including imaging, microbiology, ancillary and laboratory) are listed below for reference.   Imaging Studies: DG Abd Portable 1V  Result Date: 05/08/2023 CLINICAL DATA:   Abdominal pain. EXAM: PORTABLE ABDOMEN - 1 VIEW COMPARISON:  CT abdomen and pelvis 01/18/2019 FINDINGS: Gas is present in multiple nondilated loops of small bowel throughout the abdomen as well as in the colon and rectum. There is no evidence of bowel obstruction. No abnormal abdominal calcification is evident. Small pelvic calcifications are most compatible with phleboliths. No acute osseous abnormality is seen. IMPRESSION: Nonobstructive bowel gas pattern. Electronically Signed   By: Sebastian Ache M.D.   On: 05/08/2023 12:42   DG Chest 2 View  Result Date: 05/07/2023 CLINICAL DATA:  Central chest pain for 1 day. EXAM: CHEST - 2 VIEW COMPARISON:  12/03/2022. FINDINGS: Bilateral lung fields are clear. Bilateral costophrenic angles are clear. Normal cardio-mediastinal silhouette. No acute osseous abnormalities. The soft tissues are within normal limits. IMPRESSION: *No active cardiopulmonary disease. Electronically Signed   By: Jules Schick M.D.   On: 05/07/2023 16:02    Microbiology: Results for orders placed or performed during the hospital encounter of 08/05/21  SARS CORONAVIRUS 2 (TAT 6-24 HRS) Nasopharyngeal Nasopharyngeal Swab     Status: Abnormal   Collection Time: 08/05/21 10:33 AM   Specimen: Nasopharyngeal Swab  Result Value  Ref Range Status   SARS Coronavirus 2 POSITIVE (A) NEGATIVE Final    Comment: (NOTE) SARS-CoV-2 target nucleic acids are DETECTED.  The SARS-CoV-2 RNA is generally detectable in upper and lower respiratory specimens during the acute phase of infection. Positive results are indicative of the presence of SARS-CoV-2 RNA. Clinical correlation with patient history and other diagnostic information is  necessary to determine patient infection status. Positive results do not rule out bacterial infection or co-infection with other viruses.  The expected result is Negative.  Fact Sheet for Patients: HairSlick.no  Fact Sheet for Healthcare  Providers: quierodirigir.com  This test is not yet approved or cleared by the [place] FDA and  has been authorized for detection and/or diagnosis of SARS-CoV-2 by FDA under an Emergency Use Authorization (EUA). This EUA will remain  in effect (meaning this test can be used) for the duration of the COVID-19 declaration under Section 564(b)(1) of the Act, 21 U. S.C. section 360bbb-3(b)(1), unless the authorization is terminated or revoked sooner.   Performed at [facility], [address], [place], [place] [address]     Labs: CBC: Recent Labs  Lab 05/07/23 1305 05/08/23 0518  WBC 4.2 3.6*  HGB 12.7* 12.9*  HCT 35.5* 34.9*  MCV 103.8* 102.0*  PLT 111* 106*   Basic Metabolic Panel: Recent Labs  Lab 05/07/23 1305 05/07/23 1958 05/08/23 0518  NA 132*  --  135  K 3.1*  --  3.4*  CL 98  --  100  CO2 24  --  26  GLUCOSE 111*  --  97  BUN 12  --  11  CREATININE 1.26*  --  1.09  CALCIUM 9.0  --  9.3  MG  --  1.6*  --     Discharge time spent: 35 minutes.  Signed: Jacquelin Hawking, MD [facility] 05/09/2023

## 2023-05-09 NOTE — Hospital Course (Signed)
Jeremy Hayden is a 67 y.o. male with a history of CAD, prostate cancer, GERD, hypertension.  Patient presented secondary to chest pain and syncopal episode.  Chest pain responded to nitroglycerin.  Cardiology was consulted and evaluated patient.  No concern for ACS at this time.  Regarding syncope, patient was monitored on telemetry without severe arrhythmias noted.  During evaluation, patient was found to have evidence of symptomatic orthostatic hypotension.  Outpatient losartan was discontinued and patient started midodrine in addition to being prescribed TED hose and an abdominal binder.  Patient worked with PT and OT with recommendation for home health physical therapy.Marland Kitchen

## 2023-05-09 NOTE — Discharge Instructions (Addendum)
Jeremy Hayden,  You were here with chest pain and passing out. The cardiologist saw you and does not think you had significant issues with your heart. You were found to have something called orthostatic hypotension, where your blood pressure drops when standing. Please take your medication as prescribed, in addition to using compression stockings and an abdominal binder when up and about.

## 2023-05-09 NOTE — Progress Notes (Signed)
Pt being d/c, VSS, IV removed, Education complete, equipment at bedside  Balinda Quails, RN 05/09/2023 3:09 PM

## 2023-05-15 ENCOUNTER — Inpatient Hospital Stay (HOSPITAL_COMMUNITY)
Admission: EM | Admit: 2023-05-15 | Discharge: 2023-05-20 | DRG: 312 | Disposition: A | Discharge status: Home Health | Payer: No Typology Code available for payment source | Attending: Internal Medicine | Admitting: Internal Medicine

## 2023-05-15 ENCOUNTER — Other Ambulatory Visit: Payer: Self-pay

## 2023-05-15 ENCOUNTER — Encounter (HOSPITAL_COMMUNITY): Payer: Self-pay | Admitting: Family Medicine

## 2023-05-15 ENCOUNTER — Emergency Department (HOSPITAL_COMMUNITY): Payer: No Typology Code available for payment source

## 2023-05-15 DIAGNOSIS — I10 Essential (primary) hypertension: Secondary | ICD-10-CM | POA: Diagnosis present

## 2023-05-15 DIAGNOSIS — K760 Fatty (change of) liver, not elsewhere classified: Secondary | ICD-10-CM | POA: Diagnosis present

## 2023-05-15 DIAGNOSIS — W19XXXA Unspecified fall, initial encounter: Secondary | ICD-10-CM | POA: Diagnosis not present

## 2023-05-15 DIAGNOSIS — S22088A Other fracture of T11-T12 vertebra, initial encounter for closed fracture: Secondary | ICD-10-CM

## 2023-05-15 DIAGNOSIS — E876 Hypokalemia: Secondary | ICD-10-CM | POA: Diagnosis not present

## 2023-05-15 DIAGNOSIS — Y92002 Bathroom of unspecified non-institutional (private) residence single-family (private) house as the place of occurrence of the external cause: Secondary | ICD-10-CM

## 2023-05-15 DIAGNOSIS — R55 Syncope and collapse: Secondary | ICD-10-CM | POA: Diagnosis not present

## 2023-05-15 DIAGNOSIS — Z8042 Family history of malignant neoplasm of prostate: Secondary | ICD-10-CM

## 2023-05-15 DIAGNOSIS — I2510 Atherosclerotic heart disease of native coronary artery without angina pectoris: Secondary | ICD-10-CM | POA: Diagnosis present

## 2023-05-15 DIAGNOSIS — I951 Orthostatic hypotension: Secondary | ICD-10-CM | POA: Diagnosis not present

## 2023-05-15 DIAGNOSIS — Z955 Presence of coronary angioplasty implant and graft: Secondary | ICD-10-CM

## 2023-05-15 DIAGNOSIS — S22078A Other fracture of T9-T10 vertebra, initial encounter for closed fracture: Secondary | ICD-10-CM

## 2023-05-15 DIAGNOSIS — R000 Tachycardia, unspecified: Secondary | ICD-10-CM | POA: Diagnosis not present

## 2023-05-15 DIAGNOSIS — S2241XA Multiple fractures of ribs, right side, initial encounter for closed fracture: Secondary | ICD-10-CM | POA: Diagnosis present

## 2023-05-15 DIAGNOSIS — K219 Gastro-esophageal reflux disease without esophagitis: Secondary | ICD-10-CM | POA: Diagnosis present

## 2023-05-15 DIAGNOSIS — S22089A Unspecified fracture of T11-T12 vertebra, initial encounter for closed fracture: Secondary | ICD-10-CM | POA: Diagnosis present

## 2023-05-15 DIAGNOSIS — E785 Hyperlipidemia, unspecified: Secondary | ICD-10-CM | POA: Diagnosis present

## 2023-05-15 DIAGNOSIS — Z8546 Personal history of malignant neoplasm of prostate: Secondary | ICD-10-CM

## 2023-05-15 DIAGNOSIS — Z832 Family history of diseases of the blood and blood-forming organs and certain disorders involving the immune mechanism: Secondary | ICD-10-CM

## 2023-05-15 DIAGNOSIS — Z79899 Other long term (current) drug therapy: Secondary | ICD-10-CM

## 2023-05-15 DIAGNOSIS — Z808 Family history of malignant neoplasm of other organs or systems: Secondary | ICD-10-CM

## 2023-05-15 LAB — URINALYSIS, ROUTINE W REFLEX MICROSCOPIC
Bilirubin Urine: NEGATIVE
Glucose, UA: 50 mg/dL — AB
Hgb urine dipstick: NEGATIVE
Ketones, ur: 5 mg/dL — AB
Leukocytes,Ua: NEGATIVE
Nitrite: NEGATIVE
Protein, ur: NEGATIVE mg/dL
Specific Gravity, Urine: 1.032 — ABNORMAL HIGH (ref 1.005–1.030)
pH: 7 (ref 5.0–8.0)

## 2023-05-15 LAB — RAPID URINE DRUG SCREEN, HOSP PERFORMED
Amphetamines: NOT DETECTED
Barbiturates: NOT DETECTED
Benzodiazepines: NOT DETECTED
Cocaine: NOT DETECTED
Opiates: NOT DETECTED
Tetrahydrocannabinol: NOT DETECTED

## 2023-05-15 LAB — COMPREHENSIVE METABOLIC PANEL
ALT: 52 U/L — ABNORMAL HIGH (ref 0–44)
AST: 67 U/L — ABNORMAL HIGH (ref 15–41)
Albumin: 2.9 g/dL — ABNORMAL LOW (ref 3.5–5.0)
Alkaline Phosphatase: 116 U/L (ref 38–126)
Anion gap: 15 (ref 5–15)
BUN: 29 mg/dL — ABNORMAL HIGH (ref 8–23)
CO2: 21 mmol/L — ABNORMAL LOW (ref 22–32)
Calcium: 9.8 mg/dL (ref 8.9–10.3)
Chloride: 98 mmol/L (ref 98–111)
Creatinine, Ser: 1.24 mg/dL (ref 0.61–1.24)
GFR, Estimated: >60 mL/min (ref >60)
Glucose, Bld: 88 mg/dL (ref 70–99)
Potassium: 4.1 mmol/L (ref 3.5–5.1)
Sodium: 134 mmol/L — ABNORMAL LOW (ref 135–145)
Total Bilirubin: 3.4 mg/dL — ABNORMAL HIGH (ref 0.3–1.2)
Total Protein: 7.2 g/dL (ref 6.5–8.1)

## 2023-05-15 LAB — CBC
HCT: 36.8 % — ABNORMAL LOW (ref 39.0–52.0)
Hemoglobin: 12.9 g/dL — ABNORMAL LOW (ref 13.0–17.0)
MCH: 36.1 pg — ABNORMAL HIGH (ref 26.0–34.0)
MCHC: 35.1 g/dL (ref 30.0–36.0)
MCV: 103.1 fL — ABNORMAL HIGH (ref 80.0–100.0)
Platelets: 292 10*3/uL (ref 150–400)
RBC: 3.57 MIL/uL — ABNORMAL LOW (ref 4.22–5.81)
RDW: 12.7 % (ref 11.5–15.5)
WBC: 3.9 10*3/uL — ABNORMAL LOW (ref 4.0–10.5)
nRBC: 0 % (ref 0.0–0.2)

## 2023-05-15 LAB — CBG MONITORING, ED: Glucose-Capillary: 92 mg/dL (ref 70–99)

## 2023-05-15 LAB — AMMONIA: Ammonia: 32 umol/L (ref 9–35)

## 2023-05-15 LAB — TROPONIN I (HIGH SENSITIVITY)
Troponin I (High Sensitivity): 5 ng/L (ref <18)
Troponin I (High Sensitivity): 4 ng/L (ref <18)

## 2023-05-15 LAB — I-STAT CHEM 8, ED
BUN: 40 mg/dL — ABNORMAL HIGH (ref 8–23)
Calcium, Ion: 1.19 mmol/L (ref 1.15–1.40)
Chloride: 97 mmol/L — ABNORMAL LOW (ref 98–111)
Creatinine, Ser: 1.2 mg/dL (ref 0.61–1.24)
Glucose, Bld: 88 mg/dL (ref 70–99)
HCT: 39 % (ref 39.0–52.0)
Hemoglobin: 13.3 g/dL (ref 13.0–17.0)
Potassium: 4.6 mmol/L (ref 3.5–5.1)
Sodium: 133 mmol/L — ABNORMAL LOW (ref 135–145)
TCO2: 25 mmol/L (ref 22–32)

## 2023-05-15 LAB — CREATININE, SERUM
Creatinine, Ser: 1.15 mg/dL (ref 0.61–1.24)
GFR, Estimated: >60 mL/min (ref >60)

## 2023-05-15 LAB — ETHANOL: Alcohol, Ethyl (B): <10 mg/dL (ref <10)

## 2023-05-15 LAB — MAGNESIUM: Magnesium: 1.7 mg/dL (ref 1.7–2.4)

## 2023-05-15 MED ORDER — MIDODRINE HCL 5 MG PO TABS: 5.0000 mg | ORAL_TABLET | Freq: Two times a day (BID) | ORAL | Status: DC

## 2023-05-15 MED ORDER — SODIUM CHLORIDE 0.9 % IV BOLUS
500.0000 mL | Freq: Once | INTRAVENOUS | Status: AC
  Administered 2023-05-15: 500 mL via INTRAVENOUS

## 2023-05-15 MED ORDER — THIAMINE MONONITRATE 100 MG PO TABS
100.0000 mg | ORAL_TABLET | Freq: Every day | ORAL | Status: DC
  Administered 2023-05-17 – 2023-05-20 (×4): 100 mg via ORAL
  Filled 2023-05-15 (×6): qty 1

## 2023-05-15 MED ORDER — ACETAMINOPHEN 325 MG PO TABS
650.0000 mg | ORAL_TABLET | Freq: Four times a day (QID) | ORAL | Status: DC | PRN
  Administered 2023-05-19: 650 mg via ORAL
  Filled 2023-05-15: qty 2

## 2023-05-15 MED ORDER — ENOXAPARIN SODIUM 40 MG/0.4ML IJ SOSY
40.0000 mg | PREFILLED_SYRINGE | INTRAMUSCULAR | Status: DC
  Administered 2023-05-15 – 2023-05-19 (×5): 40 mg via SUBCUTANEOUS
  Filled 2023-05-15 (×5): qty 0.4

## 2023-05-15 MED ORDER — SODIUM CHLORIDE 0.9% FLUSH
3.0000 mL | Freq: Two times a day (BID) | INTRAVENOUS | Status: DC
  Administered 2023-05-15 – 2023-05-20 (×8): 3 mL via INTRAVENOUS

## 2023-05-15 MED ORDER — FENTANYL CITRATE PF 50 MCG/ML IJ SOSY: 50.0000 ug | PREFILLED_SYRINGE | Freq: Once | INTRAMUSCULAR | Status: DC

## 2023-05-15 MED ORDER — OXYCODONE HCL 5 MG PO TABS
5.0000 mg | ORAL_TABLET | Freq: Four times a day (QID) | ORAL | Status: DC | PRN
  Administered 2023-05-17 – 2023-05-19 (×5): 5 mg via ORAL
  Filled 2023-05-15 (×6): qty 1

## 2023-05-15 MED ORDER — ACETAMINOPHEN 650 MG RE SUPP: 650.0000 mg | Freq: Four times a day (QID) | RECTAL | Status: DC | PRN

## 2023-05-15 MED ORDER — HYDRALAZINE HCL 20 MG/ML IJ SOLN
10.0000 mg | Freq: Four times a day (QID) | INTRAMUSCULAR | Status: DC | PRN
  Administered 2023-05-17: 10 mg via INTRAVENOUS
  Filled 2023-05-15: qty 1

## 2023-05-15 MED ORDER — PANTOPRAZOLE SODIUM 40 MG PO TBEC
40.0000 mg | DELAYED_RELEASE_TABLET | Freq: Every day | ORAL | Status: DC
  Administered 2023-05-17 – 2023-05-20 (×4): 40 mg via ORAL
  Filled 2023-05-15 (×6): qty 1

## 2023-05-15 MED ORDER — ONDANSETRON HCL 4 MG PO TABS: 4.0000 mg | ORAL_TABLET | Freq: Four times a day (QID) | ORAL | Status: DC | PRN

## 2023-05-15 MED ORDER — SODIUM CHLORIDE 0.9 % IV SOLN: INTRAVENOUS | Status: DC

## 2023-05-15 MED ORDER — IOHEXOL 350 MG/ML SOLN
75.0000 mL | Freq: Once | INTRAVENOUS | Status: AC | PRN
  Administered 2023-05-15: 75 mL via INTRAVENOUS

## 2023-05-15 MED ORDER — ONDANSETRON HCL 4 MG/2ML IJ SOLN: 4.0000 mg | Freq: Four times a day (QID) | INTRAMUSCULAR | Status: DC | PRN

## 2023-05-15 MED ORDER — FLUTICASONE PROPIONATE 50 MCG/ACT NA SUSP: 2.0000 | Freq: Every day | NASAL | Status: DC | PRN

## 2023-05-15 MED ORDER — MIDODRINE HCL 5 MG PO TABS: 5.0000 mg | ORAL_TABLET | Freq: Every day | ORAL | Status: DC | PRN

## 2023-05-15 NOTE — ED Triage Notes (Signed)
Patient BIB EMS from home, syncope episode in bathroom hit right flank side. Denies hitting head. No deformities. Decrease intake. 130/90, 100, 98%ra. CBG 102. No blood thinners.

## 2023-05-15 NOTE — H&P (Signed)
History and Physical    Jeremy Hayden QMV:[id number] DOB: 01-16-1956 DOA: 05/15/2023  PCP: [facility]  Patient coming from: Home  I have personally briefly reviewed patient's old medical records in [facility]  Chief Complaint: Passed out  HPI: Jeremy Hayden is a 67 y.o. male with medical history significant of  CAD status post PCI and stent placement in 2013, prostate cancer, GERD, hypertension and recurrent syncope due to orthostatic hypotension who was discharged from the hospital on 1025 2:24 days stay for chest pain and syncopal episode.  He presented to ED today yet with another episode of passing out that happened today while he was in the bathroom.  Per patient, he did not have any prodromal symptoms such as chest pain, shortness of breath, palpitation, dizziness, blurry vision or any other complaint.  When he fell, he hit his right side of the upper abdomen/lower chest to the edge of the bathtub.  Per him, he thinks he was out for 10 to 15 minutes.  When he woke up, he called for help.  ED Course: Upon arrival to ED, he was hypertensive with blood pressure of 158/100 but was orthostatic hypotensive.  Other vitals were stable.  No fluids given.  CBC pending.  BMP shows his baseline creatinine.  Troponin x 2 negative.  UDS negative.  Ammonia, ethanol and magnesium negative.  UA unremarkable.  CT chest abdomen shows multiple rib fractures.  Hospitalist were called for admission and further workup.  Review of Systems: As per HPI otherwise negative.    Past Medical History:  Diagnosis Date   Cancer [facility])    prostate   GERD (gastroesophageal reflux disease)    Hypertension     Past Surgical History:  Procedure Laterality Date   ESOPHAGOGASTRODUODENOSCOPY N/A 03/27/2015   Procedure: ESOPHAGOGASTRODUODENOSCOPY (EGD);  Surgeon: Ruffin Frederick, MD;  Location: [facility] ENDOSCOPY;  Service: Gastroenterology;  Laterality: N/A;   NECK SURGERY  11/2001   multilevel c  spine diskectomy, decompression, arthrodesis with bone graft and plate placed.  Dr Sharolyn Douglas   PROSTATE SURGERY     ROTATOR CUFF REPAIR Right 2003   with partial acromiectomy/plasty     reports that he has never smoked. He has never used smokeless tobacco. He reports current alcohol use of about 24.0 standard drinks of alcohol per week. He reports that he does not use drugs.  No Known Allergies  Family History  Problem Relation Age of Onset   Ovarian cancer Mother    Cancer - Colon Father    Prostate cancer Father    Brain cancer Sister    Sickle cell anemia Brother     Prior to Admission medications   Medication Sig Start Date End Date Taking? Authorizing Provider  midodrine (PROAMATINE) 2.5 MG tablet Take 0.5 tablets by mouth daily.   Yes [provider]  omeprazole (PRILOSEC) 20 MG capsule Take 20 mg by mouth daily before breakfast.   Yes [provider]  Cholecalciferol 125 MCG (5000 UT) capsule Take 5,000 Units by mouth daily. 03/20/23   [provider]  fluticasone (FLONASE) 50 MCG/ACT nasal spray Place 2 sprays into both nostrils daily as needed for allergies or rhinitis.    [provider]  hydrocortisone (ANUSOL-HC) 2.5 % rectal cream Place 1 application rectally 2 (two) times daily. Patient taking differently: Place 1 application  rectally 2 (two) times daily as needed for hemorrhoids. 08/20/21   Tomi Bamberger, PA-C  losartan (COZAAR) 25 MG tablet  Take 25 mg by mouth daily. Patient not taking: Reported on 05/15/2023    [provider]  midodrine (PROAMATINE) 5 MG tablet Take 1 tablet (5 mg total) by mouth 2 (two) times daily. May also take 1 tablet (5 mg total) daily as needed (Take extra dose in late afternoon if on your feet to prevent). Patient not taking: Reported on 05/15/2023 05/09/23   Narda Bonds, MD  Multiple Vitamins-Minerals (MULTIVITAMIN WITH MINERALS) tablet Take 1 tablet by mouth daily with breakfast.    [provider]  sodium chloride (OCEAN) 0.65 % SOLN nasal spray Place 1 spray into both nostrils as needed for congestion.    [provider]  thiamine (VITAMIN B-1) 100 MG tablet Take 100 mg by mouth daily.    [provider]  TYLENOL 500 MG tablet Take 500-1,000 mg by mouth every 6 (six) hours as needed for headache or mild pain.    [provider]    Physical Exam: Vitals:   05/15/23 1200 05/15/23 1250 05/15/23 1300 05/15/23 1305  BP: (!) 173/105  (!) 128/99 107/88  Pulse: 85  92 93  Resp: (!) 25  (!) 23 (!) 22  Temp:  98 F (36.7 C)    SpO2: 100%   100%    Constitutional: NAD, calm, comfortable Vitals:   05/15/23 1200 05/15/23 1250 05/15/23 1300 05/15/23 1305  BP: (!) 173/105  (!) 128/99 107/88  Pulse: 85  92 93  Resp: (!) 25  (!) 23 (!) 22  Temp:  98 F (36.7 C)    SpO2: 100%   100%   Eyes: PERRL, lids and conjunctivae normal ENMT: Mucous membranes are moist. Posterior pharynx clear of any exudate or lesions.Normal dentition.  Neck: normal, supple, no masses, no thyromegaly Respiratory: clear to auscultation bilaterally, no wheezing, no crackles. Normal respiratory effort. No accessory muscle use.  Cardiovascular: Regular rate and rhythm, no murmurs / rubs / gallops. No extremity edema. 2+ pedal pulses. No carotid bruits.  Abdomen: no tenderness, no masses palpated. No hepatosplenomegaly. Bowel sounds positive.  Musculoskeletal: no clubbing / cyanosis. No joint deformity upper and lower extremities. Good ROM, no contractures. Normal muscle tone.  Skin: no rashes, lesions, ulcers. No induration Neurologic: CN 2-12 grossly intact. Sensation intact, DTR normal. Strength 5/5 in all 4.  Psychiatric: Normal judgment and insight. Alert and oriented x 3. Normal mood.    Labs on Admission: I have personally reviewed following labs and imaging studies  CBC: Recent Labs  Lab 05/15/23 1201  HGB 13.3  HCT 39.0   Basic Metabolic Panel: Recent Labs   Lab 05/15/23 1152 05/15/23 1201  NA 134* 133*  K 4.1 4.6  CL 98 97*  CO2 21*  --   GLUCOSE 88 88  BUN 29* 40*  CREATININE 1.24 1.20  CALCIUM 9.8  --   MG 1.7  --    GFR: Estimated Creatinine Clearance: 52.6 mL/min (by C-G formula based on SCr of 1.2 mg/dL). Liver Function Tests: Recent Labs  Lab 05/15/23 1152  AST 67*  ALT 52*  ALKPHOS 116  BILITOT 3.4*  PROT 7.2  ALBUMIN 2.9*   No results for input(s): "LIPASE", "AMYLASE" in the last 168 hours. Recent Labs  Lab 05/15/23 1152  AMMONIA 32   Coagulation Profile: No results for input(s): "INR", "PROTIME" in the last 168 hours. Cardiac Enzymes: No results for input(s): "CKTOTAL", "CKMB", "CKMBINDEX", "TROPONINI" in the last 168 hours. BNP (last 3 results) No results for input(s): "PROBNP" in  the last 8760 hours. HbA1C: No results for input(s): "HGBA1C" in the last 72 hours. CBG: Recent Labs  Lab 05/15/23 1050  GLUCAP 92   Lipid Profile: No results for input(s): "CHOL", "HDL", "LDLCALC", "TRIG", "CHOLHDL", "LDLDIRECT" in the last 72 hours. Thyroid Function Tests: No results for input(s): "TSH", "T4TOTAL", "FREET4", "T3FREE", "THYROIDAB" in the last 72 hours. Anemia Panel: No results for input(s): "VITAMINB12", "FOLATE", "FERRITIN", "TIBC", "IRON", "RETICCTPCT" in the last 72 hours. Urine analysis:    Component Value Date/Time   COLORURINE YELLOW 05/15/2023 1627   APPEARANCEUR CLEAR 05/15/2023 1627   LABSPEC 1.032 (H) 05/15/2023 1627   PHURINE 7.0 05/15/2023 1627   GLUCOSEU 50 (A) 05/15/2023 1627   HGBUR NEGATIVE 05/15/2023 1627   BILIRUBINUR NEGATIVE 05/15/2023 1627   KETONESUR 5 (A) 05/15/2023 1627   PROTEINUR NEGATIVE 05/15/2023 1627   UROBILINOGEN 4.0 (H) 08/05/2021 1036   NITRITE NEGATIVE 05/15/2023 1627   LEUKOCYTESUR NEGATIVE 05/15/2023 1627    Radiological Exams on Admission: CT CHEST ABDOMEN PELVIS W CONTRAST  Result Date: 05/15/2023 CLINICAL DATA:  Polytrauma, blunt right sided pain, more  to lower abdomen and hip. Loss of consciousness. EXAM: CT CHEST, ABDOMEN, AND PELVIS WITH CONTRAST TECHNIQUE: Multidetector CT imaging of the chest, abdomen and pelvis was performed following the standard protocol during bolus administration of intravenous contrast. RADIATION DOSE REDUCTION: This exam was performed according to the departmental dose-optimization program which includes automated exposure control, adjustment of the mA and/or kV according to patient size and/or use of iterative reconstruction technique. CONTRAST:  75mL OMNIPAQUE IOHEXOL 350 MG/ML SOLN COMPARISON:  01/18/2019 FINDINGS: CT CHEST FINDINGS Cardiovascular: Heart is normal size. Aorta is normal caliber. Moderate coronary artery and aortic atherosclerosis. Mediastinum/Nodes: No mediastinal, hilar, or axillary adenopathy. Trachea and esophagus are unremarkable. Thyroid unremarkable. Lungs/Pleura: Linear bibasilar atelectasis. No effusions or pneumothorax. Musculoskeletal: Right posterolateral 9th through 11th rib fractures. No left rib fractures. Slight subtle depressed appearance in the superior endplates of T10 and T12, cannot exclude very subtle slight compression fractures. Chest wall soft tissues are unremarkable. CT ABDOMEN PELVIS FINDINGS Hepatobiliary: Diffuse low-density throughout the liver compatible with fatty infiltration. No focal abnormality. Gallbladder unremarkable. No perihepatic hematoma. Pancreas: No focal abnormality or ductal dilatation. Spleen: No splenic injury or perisplenic hematoma. Adrenals/Urinary Tract: No adrenal hemorrhage or renal injury identified. Bladder is unremarkable. Stomach/Bowel: Normal appendix. Diffuse colonic diverticulosis. No active diverticulitis. Stomach and small bowel decompressed, unremarkable. Vascular/Lymphatic: Aortic atherosclerosis. No evidence of aneurysm or adenopathy. Reproductive: No visible focal abnormality. Other: No free fluid or free air. Musculoskeletal: No acute bony  abnormality. IMPRESSION: Posterolateral right 9th through 11th rib fractures. No effusions or pneumothorax. Bibasilar atelectasis. Subtle slightly depressed appearance of the superior endplates of T10 and T12. Question subtle slight compression fractures. Recommend correlation for pain in these areas. Coronary artery disease, aortic atherosclerosis. Hepatic steatosis. Colonic diverticulosis. Electronically Signed   By: Charlett Nose M.D.   On: 05/15/2023 15:21   CT Head Wo Contrast  Result Date: 05/15/2023 CLINICAL DATA:  Neck trauma (Age >= 65y); Head trauma, minor (Age >= 65y) EXAM: CT HEAD WITHOUT CONTRAST CT CERVICAL SPINE WITHOUT CONTRAST TECHNIQUE: Multidetector CT imaging of the head and cervical spine was performed following the standard protocol without intravenous contrast. Multiplanar CT image reconstructions of the cervical spine were also generated. RADIATION DOSE REDUCTION: This exam was performed according to the departmental dose-optimization program which includes automated exposure control, adjustment of the mA and/or kV according to patient size and/or use of iterative reconstruction technique. COMPARISON:  CT Head and C Spine 01/24/23 FINDINGS: CT HEAD FINDINGS Brain: No hemorrhage. No hydrocephalus. No extra-axial fluid collection. No CT evidence of an acute cortical infarct. No mass effect. No mass lesion. Mineralization of the basal ganglia bilaterally. Sequela of mild chronic microvascular ischemic change. Vascular: No hyperdense vessel or unexpected calcification. Mildly ectatic appearance of the basilar artery, unchanged from prior Skull: Mild soft swelling over the posterior scalp. No evidence of an underlying calvarial fracture. Sinuses/Orbits: No middle ear or mastoid effusion. Paranasal sinuses are clear. Orbits are unremarkable. Other: None. CT CERVICAL SPINE FINDINGS Alignment: Normal. Skull base and vertebrae: Status post C4-C6 ACDF Soft tissues and spinal canal: No prevertebral  fluid or swelling. No visible canal hematoma. Disc levels:  No evidence of high-grade spinal canal stenosis Upper chest: Negative. Other: None IMPRESSION: 1. No CT evidence of intracranial injury. 2. Mild soft tissue swelling over the posterior scalp. No evidence of an underlying calvarial fracture. 3. No acute fracture or traumatic subluxation of the cervical spine. Electronically Signed   By: Lorenza Cambridge M.D.   On: 05/15/2023 14:28   CT Cervical Spine Wo Contrast  Result Date: 05/15/2023 CLINICAL DATA:  Neck trauma (Age >= 65y); Head trauma, minor (Age >= 65y) EXAM: CT HEAD WITHOUT CONTRAST CT CERVICAL SPINE WITHOUT CONTRAST TECHNIQUE: Multidetector CT imaging of the head and cervical spine was performed following the standard protocol without intravenous contrast. Multiplanar CT image reconstructions of the cervical spine were also generated. RADIATION DOSE REDUCTION: This exam was performed according to the departmental dose-optimization program which includes automated exposure control, adjustment of the mA and/or kV according to patient size and/or use of iterative reconstruction technique. COMPARISON:  CT Head and C Spine 01/24/23 FINDINGS: CT HEAD FINDINGS Brain: No hemorrhage. No hydrocephalus. No extra-axial fluid collection. No CT evidence of an acute cortical infarct. No mass effect. No mass lesion. Mineralization of the basal ganglia bilaterally. Sequela of mild chronic microvascular ischemic change. Vascular: No hyperdense vessel or unexpected calcification. Mildly ectatic appearance of the basilar artery, unchanged from prior Skull: Mild soft swelling over the posterior scalp. No evidence of an underlying calvarial fracture. Sinuses/Orbits: No middle ear or mastoid effusion. Paranasal sinuses are clear. Orbits are unremarkable. Other: None. CT CERVICAL SPINE FINDINGS Alignment: Normal. Skull base and vertebrae: Status post C4-C6 ACDF Soft tissues and spinal canal: No prevertebral fluid or  swelling. No visible canal hematoma. Disc levels:  No evidence of high-grade spinal canal stenosis Upper chest: Negative. Other: None IMPRESSION: 1. No CT evidence of intracranial injury. 2. Mild soft tissue swelling over the posterior scalp. No evidence of an underlying calvarial fracture. 3. No acute fracture or traumatic subluxation of the cervical spine. Electronically Signed   By: Lorenza Cambridge M.D.   On: 05/15/2023 14:28   DG Pelvis Portable  Result Date: 05/15/2023 CLINICAL DATA:  Pain after fall EXAM: PORTABLE PELVIS 1 VIEWS COMPARISON:  None Available. FINDINGS: No fracture or dislocation. Preserved joint spaces. There is some sclerosis of the right sacroiliac joint. Scattered vascular calcifications. IMPRESSION: Osteopenia. Degenerative changes of the right sacroiliac joint right-greater-than-left. Electronically Signed   By: Karen Kays M.D.   On: 05/15/2023 14:12   DG Chest Portable 1 View  Result Date: 05/15/2023 CLINICAL DATA:  Pain after fall. EXAM: PORTABLE CHEST 1 VIEW COMPARISON:  X-ray 05/07/2023 and older. FINDINGS: Underinflation. No pneumothorax, effusion or edema. Normal cardiopericardial silhouette. Overlapping cardiac leads. IMPRESSION: Underinflation.  No acute cardiopulmonary disease. Electronically Signed   By: Piedad Climes.D.  On: 05/15/2023 14:11    EKG: Independently reviewed.  Sinus rhythm with no acute ST-T wave changes.  Assessment/Plan Principal Problem:   Syncope Active Problems:   Hyperlipidemia   Hypertension   GERD   Orthostatic hypotension   Recurrent syncope secondary to orthostatic hypotension/also history of hypertension: He is on losartan for hypertension, not sure if he needs that.  Orthostatic positive in the ED.  He was discharged on midodrine 5 mg p.o. twice daily during recent hospitalization but he says that he takes only 2.5 mg p.o. twice daily.  I will resume at twice daily 5 mg to 10.  Check orthostatics every shift.  Last echo was  checked in September 2023 which did not show any valvular heart disease, although it does seem to be secondary to orthostatic hypotension however we will go ahead and order echo to rule out valvular heart disease.  I will also hydrate him with normal saline 100 cc/h for next 15 hours.  Right 9th through 11th rib fracture: Secondary to trauma that happened after syncope today.  Thankfully, there is no pneumothorax.  Patient is symptomatic with the pain but no shortness of breath.  I have ordered incentive spirometry and pain medications.  GERD: PPI.  Elevated LFTs/hyperbilirubinemia: Slightly elevated LFTs, AST slightly greater than ALT.  Unsure about his alcohol intake.  Alcohol was negative today.  CT abdomen shows fatty infiltration of the liver.  This can likely be secondary to trauma to the liver due to fall today.  Repeat CMP in the morning to make sure levels are not going higher tomorrow.  Possible T10 and T12 fracture: CT chest abdomen also shows slightly depressed appearance of the superior endplates of T10, W09, question subtle slight compression fracture.  Patient is not complaining of any back pain.  We will consult PT OT to assess him tomorrow.  DVT prophylaxis: enoxaparin (LOVENOX) injection 40 mg Start: 05/15/23 1800 Code Status: Full code Family Communication: Brother present at bedside.  Plan of care discussed with patient in length and he verbalized understanding and agreed with it. Disposition Plan: May discharge in 1 to 2 days. Consults called: None  Hughie Closs MD [facility]  *Please note that this is a verbal dictation therefore any spelling or grammatical errors are due to the "[employer] One" system interpretation.  Please page via Amion and do not message via secure chat for urgent patient care matters. Secure chat can be used for non urgent patient care matters. 05/15/2023, 5:58 PM  To contact the attending provider between 7A-7P or the covering provider  during after hours 7P-7A, please log into the web site [email or web address]

## 2023-05-15 NOTE — ED Notes (Signed)
ED TO INPATIENT HANDOFF REPORT  ED Nurse Name and Phone #: Mardee Clune/Latoya [phone]  S Name/Age/Gender Jeremy Hayden 67 y.o. male Room/Bed: 025C/025C  Code Status   Code Status: Full Code  Home/SNF/Other Home Patient oriented to: self, place, time, and situation Is this baseline? Yes   Triage Complete: Triage complete  Chief Complaint Syncope [R55]  Triage Note Patient BIB EMS from home, syncope episode in bathroom hit right flank side. Denies hitting head. No deformities. Decrease intake. 130/90, 100, 98%ra. CBG 102. No blood thinners.   Allergies No Known Allergies  Level of Care/Admitting Diagnosis ED Disposition     ED Disposition  Admit   Condition  --   Comment  Hospital Area: [facility] [[id number]]  Level of Care: Med-Surg [16]  May place patient in observation at [facility] or [facility] if equivalent level of care is available:: No  Covid Evaluation: Asymptomatic - no recent exposure (last 10 days) testing not required  Diagnosis: Syncope [206001]  Admitting Physician: Hughie Closs [[id number]]  Attending Physician: Hughie Closs [[id number]]          B Medical/Surgery History Past Medical History:  Diagnosis Date   Cancer (HCC)    prostate   GERD (gastroesophageal reflux disease)    Hypertension    Past Surgical History:  Procedure Laterality Date   ESOPHAGOGASTRODUODENOSCOPY N/A 03/27/2015   Procedure: ESOPHAGOGASTRODUODENOSCOPY (EGD);  Surgeon: Ruffin Frederick, MD;  Location: [facility] ENDOSCOPY;  Service: Gastroenterology;  Laterality: N/A;   NECK SURGERY  11/2001   multilevel c spine diskectomy, decompression, arthrodesis with bone graft and plate placed.  Dr Sharolyn Douglas   PROSTATE SURGERY     ROTATOR CUFF REPAIR Right 2003   with partial acromiectomy/plasty     A IV Location/Drains/Wounds Patient Lines/Drains/Airways Status     Active Line/Drains/Airways     Name Placement date Placement time Site Days   Peripheral  IV 05/15/23 20 G Anterior;Distal;Right Forearm 05/15/23  1155  Forearm  less than 1            Intake/Output Last 24 hours No intake or output data in the 24 hours ending 05/15/23 1630  Labs/Imaging Results for orders placed or performed during the hospital encounter of 05/15/23 (from the past 48 hour(s))  CBG monitoring, ED     Status: None   Collection Time: 05/15/23 10:50 AM  Result Value Ref Range   Glucose-Capillary 92 70 - 99 mg/dL    Comment: Glucose reference range applies only to samples taken after fasting for at least 8 hours.   Comment 1 Notify RN    Comment 2 Document in Chart   Rapid urine drug screen (hospital performed)     Status: None   Collection Time: 05/15/23 11:15 AM  Result Value Ref Range   Opiates NONE DETECTED NONE DETECTED   Cocaine NONE DETECTED NONE DETECTED   Benzodiazepines NONE DETECTED NONE DETECTED   Amphetamines NONE DETECTED NONE DETECTED   Tetrahydrocannabinol NONE DETECTED NONE DETECTED   Barbiturates NONE DETECTED NONE DETECTED    Comment: (NOTE) DRUG SCREEN FOR MEDICAL PURPOSES ONLY.  IF CONFIRMATION IS NEEDED FOR ANY PURPOSE, NOTIFY LAB WITHIN 5 DAYS.  LOWEST DETECTABLE LIMITS FOR URINE DRUG SCREEN Drug Class                     Cutoff (ng/mL) Amphetamine and metabolites    1000 Barbiturate and metabolites    200 Benzodiazepine  200 Opiates and metabolites        300 Cocaine and metabolites        300 THC                            50 Performed at [facility], [address] [place], [place] [address]   Troponin I (High Sensitivity)     Status: None   Collection Time: 05/15/23 11:52 AM  Result Value Ref Range   Troponin I (High Sensitivity) 5 <18 ng/L    Comment: (NOTE) Elevated high sensitivity troponin I (hsTnI) values and significant  changes across serial measurements may suggest ACS but many other  chronic and acute conditions are known to elevate hsTnI results.  Refer to the "Links" section  for chest pain algorithms and additional  guidance. Performed at [facility], [address] [place], [place] [address]   Comprehensive metabolic panel     Status: Abnormal   Collection Time: 05/15/23 11:52 AM  Result Value Ref Range   Sodium 134 (L) 135 - 145 mmol/L   Potassium 4.1 3.5 - 5.1 mmol/L   Chloride 98 98 - 111 mmol/L   CO2 21 (L) 22 - 32 mmol/L   Glucose, Bld 88 70 - 99 mg/dL    Comment: Glucose reference range applies only to samples taken after fasting for at least 8 hours.   BUN 29 (H) 8 - 23 mg/dL   Creatinine, Ser 8.65 0.61 - 1.24 mg/dL   Calcium 9.8 8.9 - 78.4 mg/dL   Total Protein 7.2 6.5 - 8.1 g/dL   Albumin 2.9 (L) 3.5 - 5.0 g/dL   AST 67 (H) 15 - 41 U/L   ALT 52 (H) 0 - 44 U/L   Alkaline Phosphatase 116 38 - 126 U/L   Total Bilirubin 3.4 (H) 0.3 - 1.2 mg/dL   GFR, Estimated >69 >62 mL/min    Comment: (NOTE) Calculated using the CKD-EPI Creatinine Equation (2021)    Anion gap 15 5 - 15    Comment: Performed at [facility], [address], [place], [place] [address]  Ammonia     Status: None   Collection Time: 05/15/23 11:52 AM  Result Value Ref Range   Ammonia 32 9 - 35 umol/L    Comment: Performed at [facility], [address], [place], [place] [address]  Ethanol     Status: None   Collection Time: 05/15/23 11:52 AM  Result Value Ref Range   Alcohol, Ethyl (B) <10 <10 mg/dL    Comment: (NOTE) Lowest detectable limit for serum alcohol is 10 mg/dL.  For medical purposes only. Performed at [facility], [address] [place], [place] [address]   Magnesium     Status: None   Collection Time: 05/15/23 11:52 AM  Result Value Ref Range   Magnesium 1.7 1.7 - 2.4 mg/dL    Comment: Performed at [facility], [address] [place], [place] [address]  I-stat chem 8, ED (not at [facility], [facility] or [facility])     Status: Abnormal   Collection Time: 05/15/23 12:01 PM  Result Value Ref Range   Sodium 133 (L) 135 - 145 mmol/L    Potassium 4.6 3.5 - 5.1 mmol/L   Chloride 97 (L) 98 - 111 mmol/L   BUN 40 (H) 8 - 23 mg/dL   Creatinine, Ser 4.40 0.61 - 1.24 mg/dL  Glucose, Bld 88 70 - 99 mg/dL    Comment: Glucose reference range applies only to samples taken after fasting for at least 8 hours.   Calcium, Ion 1.19 1.15 - 1.40 mmol/L   TCO2 25 22 - 32 mmol/L   Hemoglobin 13.3 13.0 - 17.0 g/dL   HCT 44.0 10.2 - 72.5 %  Troponin I (High Sensitivity)     Status: None   Collection Time: 05/15/23  2:15 PM  Result Value Ref Range   Troponin I (High Sensitivity) 4 <18 ng/L    Comment: (NOTE) Elevated high sensitivity troponin I (hsTnI) values and significant  changes across serial measurements may suggest ACS but many other  chronic and acute conditions are known to elevate hsTnI results.  Refer to the "Links" section for chest pain algorithms and additional  guidance. Performed at [facility], [address], [place], [place] [address]    CT CHEST ABDOMEN PELVIS W CONTRAST  Result Date: 05/15/2023 CLINICAL DATA:  Polytrauma, blunt right sided pain, more to lower abdomen and hip. Loss of consciousness. EXAM: CT CHEST, ABDOMEN, AND PELVIS WITH CONTRAST TECHNIQUE: Multidetector CT imaging of the chest, abdomen and pelvis was performed following the standard protocol during bolus administration of intravenous contrast. RADIATION DOSE REDUCTION: This exam was performed according to the departmental dose-optimization program which includes automated exposure control, adjustment of the mA and/or kV according to patient size and/or use of iterative reconstruction technique. CONTRAST:  75mL OMNIPAQUE IOHEXOL 350 MG/ML SOLN COMPARISON:  01/18/2019 FINDINGS: CT CHEST FINDINGS Cardiovascular: Heart is normal size. Aorta is normal caliber. Moderate coronary artery and aortic atherosclerosis. Mediastinum/Nodes: No mediastinal, hilar, or axillary adenopathy. Trachea and esophagus are unremarkable. Thyroid unremarkable. Lungs/Pleura:  Linear bibasilar atelectasis. No effusions or pneumothorax. Musculoskeletal: Right posterolateral 9th through 11th rib fractures. No left rib fractures. Slight subtle depressed appearance in the superior endplates of T10 and T12, cannot exclude very subtle slight compression fractures. Chest wall soft tissues are unremarkable. CT ABDOMEN PELVIS FINDINGS Hepatobiliary: Diffuse low-density throughout the liver compatible with fatty infiltration. No focal abnormality. Gallbladder unremarkable. No perihepatic hematoma. Pancreas: No focal abnormality or ductal dilatation. Spleen: No splenic injury or perisplenic hematoma. Adrenals/Urinary Tract: No adrenal hemorrhage or renal injury identified. Bladder is unremarkable. Stomach/Bowel: Normal appendix. Diffuse colonic diverticulosis. No active diverticulitis. Stomach and small bowel decompressed, unremarkable. Vascular/Lymphatic: Aortic atherosclerosis. No evidence of aneurysm or adenopathy. Reproductive: No visible focal abnormality. Other: No free fluid or free air. Musculoskeletal: No acute bony abnormality. IMPRESSION: Posterolateral right 9th through 11th rib fractures. No effusions or pneumothorax. Bibasilar atelectasis. Subtle slightly depressed appearance of the superior endplates of T10 and T12. Question subtle slight compression fractures. Recommend correlation for pain in these areas. Coronary artery disease, aortic atherosclerosis. Hepatic steatosis. Colonic diverticulosis. Electronically Signed   By: Charlett Nose M.D.   On: 05/15/2023 15:21   CT Head Wo Contrast  Result Date: 05/15/2023 CLINICAL DATA:  Neck trauma (Age >= 65y); Head trauma, minor (Age >= 65y) EXAM: CT HEAD WITHOUT CONTRAST CT CERVICAL SPINE WITHOUT CONTRAST TECHNIQUE: Multidetector CT imaging of the head and cervical spine was performed following the standard protocol without intravenous contrast. Multiplanar CT image reconstructions of the cervical spine were also generated. RADIATION  DOSE REDUCTION: This exam was performed according to the departmental dose-optimization program which includes automated exposure control, adjustment of the mA and/or kV according to patient size and/or use of iterative reconstruction technique. COMPARISON:  CT Head and C Spine 01/24/23 FINDINGS: CT HEAD FINDINGS  Brain: No hemorrhage. No hydrocephalus. No extra-axial fluid collection. No CT evidence of an acute cortical infarct. No mass effect. No mass lesion. Mineralization of the basal ganglia bilaterally. Sequela of mild chronic microvascular ischemic change. Vascular: No hyperdense vessel or unexpected calcification. Mildly ectatic appearance of the basilar artery, unchanged from prior Skull: Mild soft swelling over the posterior scalp. No evidence of an underlying calvarial fracture. Sinuses/Orbits: No middle ear or mastoid effusion. Paranasal sinuses are clear. Orbits are unremarkable. Other: None. CT CERVICAL SPINE FINDINGS Alignment: Normal. Skull base and vertebrae: Status post C4-C6 ACDF Soft tissues and spinal canal: No prevertebral fluid or swelling. No visible canal hematoma. Disc levels:  No evidence of high-grade spinal canal stenosis Upper chest: Negative. Other: None IMPRESSION: 1. No CT evidence of intracranial injury. 2. Mild soft tissue swelling over the posterior scalp. No evidence of an underlying calvarial fracture. 3. No acute fracture or traumatic subluxation of the cervical spine. Electronically Signed   By: Lorenza Cambridge M.D.   On: 05/15/2023 14:28   CT Cervical Spine Wo Contrast  Result Date: 05/15/2023 CLINICAL DATA:  Neck trauma (Age >= 65y); Head trauma, minor (Age >= 65y) EXAM: CT HEAD WITHOUT CONTRAST CT CERVICAL SPINE WITHOUT CONTRAST TECHNIQUE: Multidetector CT imaging of the head and cervical spine was performed following the standard protocol without intravenous contrast. Multiplanar CT image reconstructions of the cervical spine were also generated. RADIATION DOSE REDUCTION:  This exam was performed according to the departmental dose-optimization program which includes automated exposure control, adjustment of the mA and/or kV according to patient size and/or use of iterative reconstruction technique. COMPARISON:  CT Head and C Spine 01/24/23 FINDINGS: CT HEAD FINDINGS Brain: No hemorrhage. No hydrocephalus. No extra-axial fluid collection. No CT evidence of an acute cortical infarct. No mass effect. No mass lesion. Mineralization of the basal ganglia bilaterally. Sequela of mild chronic microvascular ischemic change. Vascular: No hyperdense vessel or unexpected calcification. Mildly ectatic appearance of the basilar artery, unchanged from prior Skull: Mild soft swelling over the posterior scalp. No evidence of an underlying calvarial fracture. Sinuses/Orbits: No middle ear or mastoid effusion. Paranasal sinuses are clear. Orbits are unremarkable. Other: None. CT CERVICAL SPINE FINDINGS Alignment: Normal. Skull base and vertebrae: Status post C4-C6 ACDF Soft tissues and spinal canal: No prevertebral fluid or swelling. No visible canal hematoma. Disc levels:  No evidence of high-grade spinal canal stenosis Upper chest: Negative. Other: None IMPRESSION: 1. No CT evidence of intracranial injury. 2. Mild soft tissue swelling over the posterior scalp. No evidence of an underlying calvarial fracture. 3. No acute fracture or traumatic subluxation of the cervical spine. Electronically Signed   By: Lorenza Cambridge M.D.   On: 05/15/2023 14:28   DG Pelvis Portable  Result Date: 05/15/2023 CLINICAL DATA:  Pain after fall EXAM: PORTABLE PELVIS 1 VIEWS COMPARISON:  None Available. FINDINGS: No fracture or dislocation. Preserved joint spaces. There is some sclerosis of the right sacroiliac joint. Scattered vascular calcifications. IMPRESSION: Osteopenia. Degenerative changes of the right sacroiliac joint right-greater-than-left. Electronically Signed   By: Karen Kays M.D.   On: 05/15/2023 14:12    DG Chest Portable 1 View  Result Date: 05/15/2023 CLINICAL DATA:  Pain after fall. EXAM: PORTABLE CHEST 1 VIEW COMPARISON:  X-ray 05/07/2023 and older. FINDINGS: Underinflation. No pneumothorax, effusion or edema. Normal cardiopericardial silhouette. Overlapping cardiac leads. IMPRESSION: Underinflation.  No acute cardiopulmonary disease. Electronically Signed   By: Karen Kays M.D.   On: 05/15/2023 14:11    Pending Labs  Unresulted Labs (From admission, onward)     Start     Ordered   05/22/23 0500  Creatinine, serum  (enoxaparin (LOVENOX)    CrCl >/= 30 ml/min)  Weekly,   R     Comments: while on enoxaparin therapy    05/15/23 1625   05/16/23 0500  Basic metabolic panel  Tomorrow morning,   R        05/15/23 1625   05/15/23 1625  CBC  (enoxaparin (LOVENOX)    CrCl >/= 30 ml/min)  Once,   R       Comments: Baseline for enoxaparin therapy IF NOT ALREADY DRAWN.  Notify MD if PLT < 100 K.    05/15/23 1625   05/15/23 1625  Creatinine, serum  (enoxaparin (LOVENOX)    CrCl >/= 30 ml/min)  Once,   R       Comments: Baseline for enoxaparin therapy IF NOT ALREADY DRAWN.    05/15/23 1625   05/15/23 1038  CBC  Once,   STAT        05/15/23 1039   05/15/23 1038  Urinalysis, Routine w reflex microscopic -Urine, Clean Catch  Once,   URGENT       Question:  Specimen Source  Answer:  Urine, Clean Catch   05/15/23 1039            Vitals/Pain Today's Vitals   05/15/23 1200 05/15/23 1250 05/15/23 1300 05/15/23 1305  BP: (!) 173/105  (!) 128/99 107/88  Pulse: 85  92 93  Resp: (!) 25  (!) 23 (!) 22  Temp:  98 F (36.7 C)    SpO2: 100%   100%    Isolation Precautions No active isolations  Medications Medications  fentaNYL (SUBLIMAZE) injection 50 mcg (has no administration in time range)  fluticasone (FLONASE) 50 MCG/ACT nasal spray 2 spray (has no administration in time range)  midodrine (PROAMATINE) tablet 5 mg (has no administration in time range)    And  midodrine  (PROAMATINE) tablet 5 mg (has no administration in time range)  pantoprazole (PROTONIX) EC tablet 40 mg (has no administration in time range)  thiamine (VITAMIN B1) tablet 100 mg (has no administration in time range)  enoxaparin (LOVENOX) injection 40 mg (has no administration in time range)  sodium chloride flush (NS) 0.9 % injection 3 mL (has no administration in time range)  0.9 %  sodium chloride infusion (has no administration in time range)  acetaminophen (TYLENOL) tablet 650 mg (has no administration in time range)    Or  acetaminophen (TYLENOL) suppository 650 mg (has no administration in time range)  ondansetron (ZOFRAN) tablet 4 mg (has no administration in time range)    Or  ondansetron (ZOFRAN) injection 4 mg (has no administration in time range)  hydrALAZINE (APRESOLINE) injection 10 mg (has no administration in time range)  iohexol (OMNIPAQUE) 350 MG/ML injection 75 mL (75 mLs Intravenous Contrast Given 05/15/23 1316)    Mobility walks     Focused Assessments Cardiac Assessment Handoff:    Lab Results  Component Value Date   CKTOTAL 103 12/26/2009   CKMB 1.2 12/26/2009   TROPONINI <0.03 03/27/2015   No results found for: "DDIMER" Does the Patient currently have chest pain? No    R Recommendations: See Admitting Provider Note  Report given to:   Additional Notes:

## 2023-05-15 NOTE — ED Provider Notes (Signed)
[place] EMERGENCY DEPARTMENT AT [facility] Provider Note   CSN: [id number] Arrival date & time: 05/15/23  1029     History Chief Complaint  Patient presents with   Loss of Consciousness    AMAHD MORINO is a 67 y.o. male with h/o syncope, hypertension, orthostatic hypotension, presents to the ER for evaluation after syncope and collapse.  Patient reports that today he was standing at his toilet urinating when he collapsed and fell between the bathtub and the toilet.  He is only complaint now is right hip pain.  He does not think that he is on a blood thinner however he is unable to recall his medications.  He was just recently discharged for chest pain.  He has been seen multiple times for syncope and collapse.  He reports that he sees the [place] and he wore a Holter monitor however unsure of the follow-up for this.  Patient is oriented x 3 however does have some bouts of confusion and is an overall poor historian.  Brother at bedside does not contribute much to the story as well.  The brother reports "he thinks" the patient is at his baseline.  He reports he had some chest pain and shortness of breath last night described it as burning in nature but then when asked again about this he denied that he had any chest pain or shortness of breath.  Unsure however he denies that he felt any of the symptoms before passing out.  He reports that he supposed be on medications however does not report the name of them as he does not know them.   Loss of Consciousness Associated symptoms: chest pain and shortness of breath   Associated symptoms: no fever and no headaches        Home Medications Prior to Admission medications   Medication Sig Start Date End Date Taking? Authorizing Provider  midodrine (PROAMATINE) 2.5 MG tablet Take 0.5 tablets by mouth daily.   Yes [provider]  omeprazole (PRILOSEC) 20 MG capsule Take 20 mg by mouth daily before breakfast.   Yes [provider]  Cholecalciferol 125 MCG (5000 UT) capsule Take 5,000 Units by mouth daily. 03/20/23   [provider]  fluticasone (FLONASE) 50 MCG/ACT nasal spray Place 2 sprays into both nostrils daily as needed for allergies or rhinitis.    [provider]  hydrocortisone (ANUSOL-HC) 2.5 % rectal cream Place 1 application rectally 2 (two) times daily. Patient taking differently: Place 1 application  rectally 2 (two) times daily as needed for hemorrhoids. 08/20/21   Tomi Bamberger, PA-C  losartan (COZAAR) 25 MG tablet Take 25 mg by mouth daily. Patient not taking: Reported on 05/15/2023    [provider]  midodrine (PROAMATINE) 5 MG tablet Take 1 tablet (5 mg total) by mouth 2 (two) times daily. May also take 1 tablet (5 mg total) daily as needed (Take extra dose in late afternoon if on your feet to prevent). Patient not taking: Reported on 05/15/2023 05/09/23   Narda Bonds, MD  Multiple Vitamins-Minerals (MULTIVITAMIN WITH MINERALS) tablet Take 1 tablet by mouth daily with breakfast.    [provider]  sodium chloride (OCEAN) 0.65 % SOLN nasal spray Place 1 spray into both nostrils as needed for congestion.    [provider]  thiamine (VITAMIN B-1) 100 MG tablet Take 100 mg by mouth daily.    [provider]  TYLENOL 500 MG tablet Take 500-1,000 mg by mouth  every 6 (six) hours as needed for headache or mild pain.    [provider]      Allergies    Patient has no known allergies.    Review of Systems   Review of Systems  Constitutional:  Negative for chills and fever.  Respiratory:  Positive for shortness of breath.   Cardiovascular:  Positive for chest pain and syncope.  Neurological:  Positive for syncope. Negative for light-headedness and headaches.    Physical Exam Updated Vital Signs BP 107/88   Pulse 93   Temp 98 F (36.7 C)   Resp (!) 22   SpO2 100%  Physical Exam Vitals and nursing note reviewed.   Constitutional:      General: He is not in acute distress.    Appearance: He is not toxic-appearing.  HENT:     Head: Normocephalic and atraumatic.     Comments: No step-offs or deformities.  No raccoon eyes or battle signs.  Scalp is nontender to palpation.  Face is nontender to palpation.    Mouth/Throat:     Mouth: Mucous membranes are moist.     Comments: Poor dentition Eyes:     General: No scleral icterus.    Extraocular Movements: Extraocular movements intact.     Pupils: Pupils are equal, round, and reactive to light.  Neck:     Comments: Nontender to palpation.  No step-offs or deformities.  No signs of trauma. Cardiovascular:     Rate and Rhythm: Normal rate.  Pulmonary:     Effort: Pulmonary effort is normal. No respiratory distress.     Breath sounds: Normal breath sounds.  Chest:     Chest wall: No tenderness.  Abdominal:     Palpations: Abdomen is soft.     Tenderness: There is no abdominal tenderness. There is no guarding or rebound.     Comments: No overlying skin changes or signs of trauma.  Nontender.  Soft.  Musculoskeletal:       Arms:     Cervical back: Normal range of motion. No tenderness.     Right lower leg: No edema.     Left lower leg: No edema.     Comments: Patient has some tenderness to the lower thoracic/upper lumbar spine/midline.  He does have some associated paraspinal tenderness to this area and slightly below.  No step-offs or deformities.  No signs of trauma noted.  The patient does not have any tenderness to his bilateral upper and lower extremities other than having some tenderness to the lateral aspect of the right hip.  He is neurovascular intact distally with soft compartments.  Strength is intact and symmetric in upper and lower extremities.  Skin:    General: Skin is warm and dry.  Neurological:     General: No focal deficit present.     Mental Status: He is alert.     Comments: Unsure if patient is at his baseline.  Brother does not  seem to note.  He is oriented however does seem to have some intermittent confusion with days and overall is a questionable historian     ED Results / Procedures / Treatments   Labs (all labs ordered are listed, but only abnormal results are displayed) Labs Reviewed  CBC - Abnormal; Notable for the following components:      Result Value   WBC 3.9 (*)    RBC 3.57 (*)    Hemoglobin 12.9 (*)    HCT 36.8 (*)  MCV 103.1 (*)    MCH 36.1 (*)    All other components within normal limits  URINALYSIS, ROUTINE W REFLEX MICROSCOPIC - Abnormal; Notable for the following components:   Specific Gravity, Urine 1.032 (*)    Glucose, UA 50 (*)    Ketones, ur 5 (*)    All other components within normal limits  COMPREHENSIVE METABOLIC PANEL - Abnormal; Notable for the following components:   Sodium 134 (*)    CO2 21 (*)    BUN 29 (*)    Albumin 2.9 (*)    AST 67 (*)    ALT 52 (*)    Total Bilirubin 3.4 (*)    All other components within normal limits  I-STAT CHEM 8, ED - Abnormal; Notable for the following components:   Sodium 133 (*)    Chloride 97 (*)    BUN 40 (*)    All other components within normal limits  AMMONIA  ETHANOL  RAPID URINE DRUG SCREEN, HOSP PERFORMED  MAGNESIUM  CBC  CREATININE, SERUM  COMPREHENSIVE METABOLIC PANEL  CBG MONITORING, ED  TROPONIN I (HIGH SENSITIVITY)  TROPONIN I (HIGH SENSITIVITY)    EKG EKG Interpretation Date/Time:  Thursday May 15 2023 10:45:16 EDT Ventricular Rate:  93 PR Interval:  157 QRS Duration:  95 QT Interval:  359 QTC Calculation: 447 R Axis:   -53  Text Interpretation: Sinus rhythm Left anterior fascicular block Anteroseptal infarct, old No acute changes No significant change since last tracing Confirmed by Derwood Kaplan [phone]) on 05/15/2023 11:44:59 AM  Radiology CT CHEST ABDOMEN PELVIS W CONTRAST  Result Date: 05/15/2023 CLINICAL DATA:  Polytrauma, blunt right sided pain, more to lower abdomen and hip. Loss of  consciousness. EXAM: CT CHEST, ABDOMEN, AND PELVIS WITH CONTRAST TECHNIQUE: Multidetector CT imaging of the chest, abdomen and pelvis was performed following the standard protocol during bolus administration of intravenous contrast. RADIATION DOSE REDUCTION: This exam was performed according to the departmental dose-optimization program which includes automated exposure control, adjustment of the mA and/or kV according to patient size and/or use of iterative reconstruction technique. CONTRAST:  75mL OMNIPAQUE IOHEXOL 350 MG/ML SOLN COMPARISON:  01/18/2019 FINDINGS: CT CHEST FINDINGS Cardiovascular: Heart is normal size. Aorta is normal caliber. Moderate coronary artery and aortic atherosclerosis. Mediastinum/Nodes: No mediastinal, hilar, or axillary adenopathy. Trachea and esophagus are unremarkable. Thyroid unremarkable. Lungs/Pleura: Linear bibasilar atelectasis. No effusions or pneumothorax. Musculoskeletal: Right posterolateral 9th through 11th rib fractures. No left rib fractures. Slight subtle depressed appearance in the superior endplates of T10 and T12, cannot exclude very subtle slight compression fractures. Chest wall soft tissues are unremarkable. CT ABDOMEN PELVIS FINDINGS Hepatobiliary: Diffuse low-density throughout the liver compatible with fatty infiltration. No focal abnormality. Gallbladder unremarkable. No perihepatic hematoma. Pancreas: No focal abnormality or ductal dilatation. Spleen: No splenic injury or perisplenic hematoma. Adrenals/Urinary Tract: No adrenal hemorrhage or renal injury identified. Bladder is unremarkable. Stomach/Bowel: Normal appendix. Diffuse colonic diverticulosis. No active diverticulitis. Stomach and small bowel decompressed, unremarkable. Vascular/Lymphatic: Aortic atherosclerosis. No evidence of aneurysm or adenopathy. Reproductive: No visible focal abnormality. Other: No free fluid or free air. Musculoskeletal: No acute bony abnormality. IMPRESSION: Posterolateral  right 9th through 11th rib fractures. No effusions or pneumothorax. Bibasilar atelectasis. Subtle slightly depressed appearance of the superior endplates of T10 and T12. Question subtle slight compression fractures. Recommend correlation for pain in these areas. Coronary artery disease, aortic atherosclerosis. Hepatic steatosis. Colonic diverticulosis. Electronically Signed   By: Charlett Nose M.D.   On: 05/15/2023 15:21  CT Head Wo Contrast  Result Date: 05/15/2023 CLINICAL DATA:  Neck trauma (Age >= 65y); Head trauma, minor (Age >= 65y) EXAM: CT HEAD WITHOUT CONTRAST CT CERVICAL SPINE WITHOUT CONTRAST TECHNIQUE: Multidetector CT imaging of the head and cervical spine was performed following the standard protocol without intravenous contrast. Multiplanar CT image reconstructions of the cervical spine were also generated. RADIATION DOSE REDUCTION: This exam was performed according to the departmental dose-optimization program which includes automated exposure control, adjustment of the mA and/or kV according to patient size and/or use of iterative reconstruction technique. COMPARISON:  CT Head and C Spine 01/24/23 FINDINGS: CT HEAD FINDINGS Brain: No hemorrhage. No hydrocephalus. No extra-axial fluid collection. No CT evidence of an acute cortical infarct. No mass effect. No mass lesion. Mineralization of the basal ganglia bilaterally. Sequela of mild chronic microvascular ischemic change. Vascular: No hyperdense vessel or unexpected calcification. Mildly ectatic appearance of the basilar artery, unchanged from prior Skull: Mild soft swelling over the posterior scalp. No evidence of an underlying calvarial fracture. Sinuses/Orbits: No middle ear or mastoid effusion. Paranasal sinuses are clear. Orbits are unremarkable. Other: None. CT CERVICAL SPINE FINDINGS Alignment: Normal. Skull base and vertebrae: Status post C4-C6 ACDF Soft tissues and spinal canal: No prevertebral fluid or swelling. No visible canal  hematoma. Disc levels:  No evidence of high-grade spinal canal stenosis Upper chest: Negative. Other: None IMPRESSION: 1. No CT evidence of intracranial injury. 2. Mild soft tissue swelling over the posterior scalp. No evidence of an underlying calvarial fracture. 3. No acute fracture or traumatic subluxation of the cervical spine. Electronically Signed   By: Lorenza Cambridge M.D.   On: 05/15/2023 14:28   CT Cervical Spine Wo Contrast  Result Date: 05/15/2023 CLINICAL DATA:  Neck trauma (Age >= 65y); Head trauma, minor (Age >= 65y) EXAM: CT HEAD WITHOUT CONTRAST CT CERVICAL SPINE WITHOUT CONTRAST TECHNIQUE: Multidetector CT imaging of the head and cervical spine was performed following the standard protocol without intravenous contrast. Multiplanar CT image reconstructions of the cervical spine were also generated. RADIATION DOSE REDUCTION: This exam was performed according to the departmental dose-optimization program which includes automated exposure control, adjustment of the mA and/or kV according to patient size and/or use of iterative reconstruction technique. COMPARISON:  CT Head and C Spine 01/24/23 FINDINGS: CT HEAD FINDINGS Brain: No hemorrhage. No hydrocephalus. No extra-axial fluid collection. No CT evidence of an acute cortical infarct. No mass effect. No mass lesion. Mineralization of the basal ganglia bilaterally. Sequela of mild chronic microvascular ischemic change. Vascular: No hyperdense vessel or unexpected calcification. Mildly ectatic appearance of the basilar artery, unchanged from prior Skull: Mild soft swelling over the posterior scalp. No evidence of an underlying calvarial fracture. Sinuses/Orbits: No middle ear or mastoid effusion. Paranasal sinuses are clear. Orbits are unremarkable. Other: None. CT CERVICAL SPINE FINDINGS Alignment: Normal. Skull base and vertebrae: Status post C4-C6 ACDF Soft tissues and spinal canal: No prevertebral fluid or swelling. No visible canal hematoma. Disc  levels:  No evidence of high-grade spinal canal stenosis Upper chest: Negative. Other: None IMPRESSION: 1. No CT evidence of intracranial injury. 2. Mild soft tissue swelling over the posterior scalp. No evidence of an underlying calvarial fracture. 3. No acute fracture or traumatic subluxation of the cervical spine. Electronically Signed   By: Lorenza Cambridge M.D.   On: 05/15/2023 14:28   DG Pelvis Portable  Result Date: 05/15/2023 CLINICAL DATA:  Pain after fall EXAM: PORTABLE PELVIS 1 VIEWS COMPARISON:  None Available. FINDINGS: No fracture  or dislocation. Preserved joint spaces. There is some sclerosis of the right sacroiliac joint. Scattered vascular calcifications. IMPRESSION: Osteopenia. Degenerative changes of the right sacroiliac joint right-greater-than-left. Electronically Signed   By: Karen Kays M.D.   On: 05/15/2023 14:12   DG Chest Portable 1 View  Result Date: 05/15/2023 CLINICAL DATA:  Pain after fall. EXAM: PORTABLE CHEST 1 VIEW COMPARISON:  X-ray 05/07/2023 and older. FINDINGS: Underinflation. No pneumothorax, effusion or edema. Normal cardiopericardial silhouette. Overlapping cardiac leads. IMPRESSION: Underinflation.  No acute cardiopulmonary disease. Electronically Signed   By: Karen Kays M.D.   On: 05/15/2023 14:11    Procedures Procedures   Medications Ordered in ED Medications  fentaNYL (SUBLIMAZE) injection 50 mcg (has no administration in time range)  fluticasone (FLONASE) 50 MCG/ACT nasal spray 2 spray (has no administration in time range)  midodrine (PROAMATINE) tablet 5 mg (has no administration in time range)    And  midodrine (PROAMATINE) tablet 5 mg (has no administration in time range)  pantoprazole (PROTONIX) EC tablet 40 mg (has no administration in time range)  thiamine (VITAMIN B1) tablet 100 mg (has no administration in time range)  enoxaparin (LOVENOX) injection 40 mg (has no administration in time range)  sodium chloride flush (NS) 0.9 % injection 3  mL (has no administration in time range)  acetaminophen (TYLENOL) tablet 650 mg (has no administration in time range)    Or  acetaminophen (TYLENOL) suppository 650 mg (has no administration in time range)  ondansetron (ZOFRAN) tablet 4 mg (has no administration in time range)    Or  ondansetron (ZOFRAN) injection 4 mg (has no administration in time range)  hydrALAZINE (APRESOLINE) injection 10 mg (has no administration in time range)  oxyCODONE (Oxy IR/ROXICODONE) immediate release tablet 5 mg (has no administration in time range)  0.9 %  sodium chloride infusion (has no administration in time range)  iohexol (OMNIPAQUE) 350 MG/ML injection 75 mL (75 mLs Intravenous Contrast Given 05/15/23 1316)    ED Course/ Medical Decision Making/ A&P    Medical Decision Making Amount and/or Complexity of Data Reviewed Labs: ordered. Radiology: ordered.  Risk Prescription drug management. Decision regarding hospitalization.   67 y.o. male presents to the ER for evaluation of syncope with right hip pain. Differential diagnosis includes but is not limited to trauma, CVA, ACS, arrhythmia, vasovagal syncope, orthostatic hypotension, sepsis, hypoglycemia, electrolyte disturbance, respiratory failure, symptomatic anemia, dehydration, heat injury, polypharmacy, malignancy, anxiety/panic attack. Vital signs BP elevated, otherwise unremarkable. Physical exam as noted above.   I independently reviewed and interpreted the patient's labs.  UDS unremarkable.  Urinalysis shows slightly increased urine with 50 about a glucose and 5 ketones present.  Ammonia within normal limits.  Ethanol undetectable.  Magnesium within normal limits.  CMP does show slight hyponatremia at 134, bicarb of 21 with a normal anion gap.  BUN at 29 however normal creatinine.  Decrease in albumin 2.9.  Slight elevation in his LFTs at AST 67 ALT 52.  Total bilirubin is elevated and new at 3.4.  CBC does show some leukopenia slightly at 3.9.   Anemia 12.9.  Appears around his baseline.  Imaging reads per radiologist report: CT head and c-spine 1. No CT evidence of intracranial injury. 2. Mild soft tissue swelling over the posterior scalp. No evidence of an underlying calvarial fracture. 3. No acute fracture or traumatic subluxation of the cervical spine.   CT CAP shows - Posterolateral right 9th through 11th rib fractures. No effusions or pneumothorax. Bibasilar atelectasis. Subtle slightly  depressed appearance of the superior endplates of T10 and T12. Question subtle slight compression fractures. Recommend correlation for pain in these areas. Coronary artery disease, aortic atherosclerosis. Hepatic steatosis. Colonic diverticulosis.   DG port pelvis- Osteopenia. Degenerative changes of the right sacroiliac joint right-greater-than-left.  CXR -  Underinflation.  No acute cardiopulmonary disease.   Orthostatics show significant hypotension about standin from 170 to 107. The patient has also sustained multiple rib fracture and questionable T spine compression fractures. I have called to confirm that the right hip appears to be ok. He doesn't have any tenderness into the upper thigh or lower thigh. No tenderness to the knee or lower leg. Could be contusion.  Per nursing staff, patient was independently ambulatory to the bathroom in no acute distress.  Will leave up to hospitliast team for re-evaluation.   Because the patient was recently admitted and discharged and is still having unprovoked syncope causing him rib and spine fractures, recommending admission for re-evaluation.   My attending spoke with hospitalist for admission.  Portions of this report may have been transcribed using voice recognition software. Every effort was made to ensure accuracy; however, inadvertent computerized transcription errors may be present.   I discussed this case with my attending physician who cosigned this note including patient's presenting symptoms,  physical exam, and planned diagnostics and interventions. Attending physician stated agreement with plan or made changes to plan which were implemented.   Attending physician assessed patient at bedside.  Final Clinical Impression(s) / ED Diagnoses Final diagnoses:  Syncope and collapse  Closed fracture of multiple ribs of right side, initial encounter    Rx / DC Orders ED Discharge Orders     None         Achille Rich, PA-C 05/16/23 2215    Derwood Kaplan, MD 05/17/23 [phone]

## 2023-05-16 ENCOUNTER — Ambulatory Visit (HOSPITAL_COMMUNITY): Payer: No Typology Code available for payment source

## 2023-05-16 DIAGNOSIS — R55 Syncope and collapse: Secondary | ICD-10-CM

## 2023-05-16 LAB — COMPREHENSIVE METABOLIC PANEL
ALT: 40 U/L (ref 0–44)
AST: 47 U/L — ABNORMAL HIGH (ref 15–41)
Albumin: 2.5 g/dL — ABNORMAL LOW (ref 3.5–5.0)
Alkaline Phosphatase: 97 U/L (ref 38–126)
Anion gap: 8 (ref 5–15)
BUN: 18 mg/dL (ref 8–23)
CO2: 24 mmol/L (ref 22–32)
Calcium: 8.8 mg/dL — ABNORMAL LOW (ref 8.9–10.3)
Chloride: 102 mmol/L (ref 98–111)
Creatinine, Ser: 1.04 mg/dL (ref 0.61–1.24)
GFR, Estimated: >60 mL/min (ref >60)
Glucose, Bld: 76 mg/dL (ref 70–99)
Potassium: 3.2 mmol/L — ABNORMAL LOW (ref 3.5–5.1)
Sodium: 134 mmol/L — ABNORMAL LOW (ref 135–145)
Total Bilirubin: 2.7 mg/dL — ABNORMAL HIGH (ref 0.3–1.2)
Total Protein: 6.2 g/dL — ABNORMAL LOW (ref 6.5–8.1)

## 2023-05-16 LAB — ECHOCARDIOGRAM COMPLETE
AR max vel: 2.49 cm2
AV Peak grad: 5.5 mm[Hg]
Ao pk vel: 1.17 m/s
Area-P 1/2: 3.6 cm2
Est EF: >75
Height: 66 in
S' Lateral: 2 cm
Weight: 1968 [oz_av]

## 2023-05-16 MED ORDER — MORPHINE SULFATE (PF) 2 MG/ML IV SOLN
1.0000 mg | Freq: Once | INTRAVENOUS | Status: AC
  Administered 2023-05-16: 1 mg via INTRAVENOUS
  Filled 2023-05-16: qty 1

## 2023-05-16 MED ORDER — LIDOCAINE 5 % EX PTCH
1.0000 | MEDICATED_PATCH | Freq: Every day | CUTANEOUS | Status: DC
Start: 2023-05-16 — End: 2023-05-20
  Administered 2023-05-16 – 2023-05-20 (×5): 1 via TRANSDERMAL
  Filled 2023-05-16 (×6): qty 1

## 2023-05-16 MED ORDER — MIDODRINE HCL 5 MG PO TABS
5.0000 mg | ORAL_TABLET | Freq: Two times a day (BID) | ORAL | Status: DC
  Administered 2023-05-16 – 2023-05-18 (×3): 5 mg via ORAL
  Filled 2023-05-16 (×3): qty 1

## 2023-05-16 MED ORDER — MIDODRINE HCL 5 MG PO TABS: 5.0000 mg | ORAL_TABLET | Freq: Every day | ORAL | Status: DC | PRN

## 2023-05-16 MED ORDER — POTASSIUM CHLORIDE CRYS ER 20 MEQ PO TBCR
40.0000 meq | EXTENDED_RELEASE_TABLET | Freq: Once | ORAL | Status: AC
  Administered 2023-05-16: 40 meq via ORAL
  Filled 2023-05-16: qty 2

## 2023-05-16 NOTE — Evaluation (Signed)
Occupational Therapy Evaluation and Discharge Patient Details Name: Jeremy Hayden MRN: [id number] DOB: Apr 12, 1956 Today's Date: 05/16/2023   History of Present Illness Jeremy Hayden is a 67 y.o. male patient patient BIB EMS from home, syncope episode in bathroom hit right flank side. Chest CT: Posterolateral right 9th through 11th rib fractures. Subtle slightly depressed appearance of the superior endplates of  T10 and T12. Question subtle slight compression fractures.He was her 1 week ago with chest pain, nausea, and dizziness and found to have orthostatic HTN. Orthostatics taken in ED also showed orthostatic HTN this admission. PHMx: CAD status post PCI and stent placement in 2013, prostate cancer, GERD, HTN.   Clinical Impression   This 67 yo male presents to acute OT with PLOF of being at home for one week before returning to hospital. At home he was independent to Mod I. Currently he is a same level (using RW) but is taking more time for tasks due to pain from rib fractures. He was + for orthostatics but asymptomatic throughout session being up and about in his room. No further OT needs, we will sign off.      If plan is discharge home, recommend the following: Assistance with cooking/housework;Assist for transportation    Functional Status Assessment  Patient has not had a recent decline in their functional status  Equipment Recommendations  None recommended by OT       Precautions / Restrictions Precautions Precautions: Fall Precaution Comments: watch BP (was orthostatic but asymptomatic with OT) Restrictions Weight Bearing Restrictions: No      Mobility Bed Mobility Overal bed mobility: Modified Independent             General bed mobility comments: increased time due to pain from rib fractures    Transfers Overall transfer level: Modified independent Equipment used: Rolling walker (2 wheels) Transfers: Sit to/from Stand Sit to Stand: Modified independent  (Device/Increase time), Contact guard assist           General transfer comment: initially CGA due to checking BPs but advanced to Mod I when orthostatics were positive but pt was asympomatic when up and about in room with RW      Balance Overall balance assessment: Mild deficits observed, not formally tested                                         ADL either performed or assessed with clinical judgement   ADL Overall ADL's : Modified independent  He was able to go to bathroom with RW, stand to use bathroom, stand at sink to wash hands and return to sitting EOB. He was able to cross legs to get to feet for doffing/donning socks and to wash feet. He was able to stand and not hold onto RW.                                           Vision Patient Visual Report: No change from baseline              Pertinent Vitals/Pain Pain Assessment Pain Assessment: Faces Faces Pain Scale: Hurts little more Pain Location: R side/ribs Pain Descriptors / Indicators: Aching, Sore Pain Intervention(s): Limited activity within patient's tolerance, Monitored during session, Repositioned     Extremity/Trunk Assessment Upper Extremity  Assessment Upper Extremity Assessment: Overall WFL for tasks assessed           Communication Communication Communication: No apparent difficulties   Cognition Arousal: Alert Behavior During Therapy: WFL for tasks assessed/performed Overall Cognitive Status: Within Functional Limits for tasks assessed                                       General Comments  Pt did present with orthostatic vital signs but was asymptomatic. Laying 181/100  HR 83; sitting 132/87 HR 85; standing (0 min) 83/64  HR 91; standing (3 min) 78/63 HR 90.            Home Living Family/patient expects to be discharged to:: Private residence Living Arrangements: Alone Available Help at Discharge: Family;Available PRN/intermittently  (son checks on him daily) Type of Home: Apartment Home Access: Stairs to enter Entrance Stairs-Number of Steps: 1 Entrance Stairs-Rails: Right Home Layout: One level     Bathroom Shower/Tub: Chief Strategy Officer: Handicapped height     Home Equipment: Rollator (4 wheels);Cane - single point          Prior Functioning/Environment Prior Level of Function : Independent/Modified Independent             Mobility Comments: No AD, hx of falls (seemingly due to orthostatic hypotension) ADLs Comments: Does not drive, brother or nephew provides transportation to pt; pt sometimes walks to stores        OT Problem List: Cardiopulmonary status limiting activity         OT Goals(Current goals can be found in the care plan section) Acute Rehab OT Goals Patient Stated Goal: to go home         AM-PAC OT "6 Clicks" Daily Activity     Outcome Measure Help from another person eating meals?: None Help from another person taking care of personal grooming?: None Help from another person toileting, which includes using toliet, bedpan, or urinal?: None Help from another person bathing (including washing, rinsing, drying)?: None Help from another person to put on and taking off regular upper body clothing?: None Help from another person to put on and taking off regular lower body clothing?: None 6 Click Score: 24   End of Session Equipment Utilized During Treatment: Gait belt Nurse Communication: Mobility status (orthostatics)  Activity Tolerance: Patient tolerated treatment well Patient left: in bed;with call bell/phone within reach;with bed alarm set  OT Visit Diagnosis: Unsteadiness on feet (R26.81) (safer with RW at present (usually uses a SPC he reports))                Time: 1610-9604 OT Time Calculation (min): 25 min Charges:  OT General Charges $OT Visit: 1 Visit OT Evaluation $OT Eval Moderate Complexity: 1 Mod OT Treatments $Self Care/Home Management :  8-22 mins  Lindon Romp OT Acute Rehabilitation Services Office [phone]    Evette Georges 05/16/2023, 11:52 AM

## 2023-05-16 NOTE — Evaluation (Signed)
Physical Therapy Brief Evaluation and Discharge Note Patient Details Name: Jeremy Hayden MRN: [id number] DOB: 08/14/1955 Today's Date: 05/16/2023   History of Present Illness  Pt is a 67 y.o. male admitted 10/31 for syncope episode in bathroom hit right flank side. Chest CT: Posterolateral right 9th through 11th rib fractures. Subtle slightly depressed appearance of the superior endplates of  T10 and T12. Question subtle slight compression fractures. PMHx: CAD status post PCI and stent placement in 2013, prostate cancer, GERD, HTN.  Clinical Impression  Pt was pleasant and eager for session. He states he is at baseline function, modI/independent in \\assessed  bed mobility and ambulation. Brother available at home prn for assistance. Pt would benefit from home health physical therapy to further independence in transfers and ADLs.       PT Assessment All further PT needs can be met in the next venue of care  Assistance Needed at Discharge  PRN    Equipment Recommendations None recommended by PT  Recommendations for Other Services       Precautions/Restrictions Precautions Precautions: Fall Precaution Comments: watch BP (was orthostatic but asymptomatic with OT) Restrictions Weight Bearing Restrictions: No        Mobility  Bed Mobility Rolling: Modified independent (Device/Increase time)   Sit to supine/sidelying: Modified independent (Device/Increased time) General bed mobility comments: Required use of UE to facilitate LE movements  when going from EOB to supine  Transfers Overall transfer level: Modified independent Equipment used: Rolling walker (2 wheels) Transfers: Sit to/from Stand Sit to Stand: Modified independent (Device/Increase time)           General transfer comment: Pt completed transfer modI with use of RW when standing    Ambulation/Gait Ambulation/Gait assistance: Modified independent (Device/Increase time) Gait Distance (Feet): 250 Feet Assistive  device: Rolling walker (2 wheels) Gait Pattern/deviations: Step-through pattern, Decreased step length - right, Decreased step length - left, Decreased stride length Gait Speed: Pace WFL General Gait Details: Pt ambulated with RW states he normally uses rollator that was not present.  Home Activity Instructions    Stairs            Modified Rankin (Stroke Patients Only)        Balance Overall balance assessment: Needs assistance Sitting-balance support: Feet supported Sitting balance-Leahy Scale: Good Sitting balance - Comments: Seated EOB eating lunch at begining of session   Standing balance support: Bilateral upper extremity supported, During functional activity, Reliant on assistive device for balance Standing balance-Leahy Scale: Fair Standing balance comment: Not formally assessed, pt was able to stand with using RW for brief time after STS transfer          Pertinent Vitals/Pain   Pain Assessment Pain Assessment: No/denies pain     Home Living Family/patient expects to be discharged to:: Private residence Living Arrangements: Alone Available Help at Discharge: Family;Available PRN/intermittently Home Environment: Level entry   Home Equipment: Rollator (4 wheels);Cane - single point        Prior Function Level of Independence: Independent with assistive device(s) Comments: Pt states he is at baseline and does not feel weaker then prior to admission    UE/LE Assessment   UE ROM/Strength/Tone/Coordination: [facility]    LE ROM/Strength/Tone/Coordination: [facility]      Communication   Communication Communication: No apparent difficulties     Cognition Overall Cognitive Status: Appears within functional limits for tasks assessed/performed       General Comments General comments (skin integrity, edema, etc.): BP after ambulation 99/76 seated EOB.  No reported symptoms during ambulation.    Exercises     Assessment/Plan    PT Problem List Decreased  activity tolerance;Decreased balance;Decreased mobility;Decreased knowledge of precautions;Cardiopulmonary status limiting activity       PT Visit Diagnosis Unsteadiness on feet (R26.81);Other abnormalities of gait and mobility (R26.89);History of falling (Z91.81);Dizziness and giddiness (R42)    No Skilled PT     Co-evaluation                 AMPAC 6 Clicks Help needed turning from your back to your side while in a flat bed without using bedrails?: None Help needed moving from lying on your back to sitting on the side of a flat bed without using bedrails?: None Help needed moving to and from a bed to a chair (including a wheelchair)?: None Help needed standing up from a chair using your arms (e.g., wheelchair or bedside chair)?: None Help needed to walk in hospital room?: None Help needed climbing 3-5 steps with a railing? : A Little 6 Click Score: 23      End of Session   Activity Tolerance: Patient tolerated treatment well Patient left: in bed;with nursing/sitter in room Nurse Communication: Mobility status PT Visit Diagnosis: Unsteadiness on feet (R26.81);Other abnormalities of gait and mobility (R26.89);History of falling (Z91.81);Dizziness and giddiness (R42)     Time: 1610-9604 PT Time Calculation (min) (ACUTE ONLY): 14 min  Charges:   PT Evaluation $PT Eval Low Complexity: 1 Low      Sedonia Small, W. SPT Secure chat preferred   Darlin Drop  05/16/2023, 4:29 PM

## 2023-05-16 NOTE — Evaluation (Signed)
Clinical/Bedside Swallow Evaluation Patient Details  Name: Jeremy Hayden MRN: [id number] Date of Birth: [birth year]  Today's Date: 05/16/2023 Time: SLP Start Time (ACUTE ONLY): 0955 SLP Stop Time (ACUTE ONLY): 1027 SLP Time Calculation (min) (ACUTE ONLY): 32 min  Past Medical History:  Past Medical History:  Diagnosis Date   Cancer (HCC)    prostate   GERD (gastroesophageal reflux disease)    Hypertension    Past Surgical History:  Past Surgical History:  Procedure Laterality Date   ESOPHAGOGASTRODUODENOSCOPY N/A 03/27/2015   Procedure: ESOPHAGOGASTRODUODENOSCOPY (EGD);  Surgeon: Ruffin Frederick, MD;  Location: [facility] ENDOSCOPY;  Service: Gastroenterology;  Laterality: N/A;   NECK SURGERY  11/2001   multilevel c spine diskectomy, decompression, arthrodesis with bone graft and plate placed.  Dr Sharolyn Douglas   PROSTATE SURGERY     ROTATOR CUFF REPAIR Right 2003   with partial acromiectomy/plasty   HPI:  Pt is a 67 yo male presenting after recurrent syncope resulting in R 9th-11th rib fxs. Pt was made NPO and SLP was ordered when he was observed to be pocketing pills. PMH includes: GERD, prostate ca, HTN, neck surgery    Assessment / Plan / Recommendation  Clinical Impression  Pt's oropharyngeal swallow appears to be [facility] with no evidence of pocketing and no overt signs concerning for aspiration. Pt reports that at home he needs to take his time and take pills one at a time. Education was provided about use of general aspiration precautions while in hospital in light of deconditioning, guarded cough from rib pain, and potential for fluctuating alertness with pain medications. Overall he seems to be swallowing well though so recommend resuming regular solids and thin liquids. Would give pills at a slow rate and one at a time. SLP Visit Diagnosis: Dysphagia, unspecified (R13.10)    Aspiration Risk  Mild aspiration risk    Diet Recommendation Regular;Thin liquid    Liquid Administration  via: Cup;Straw Medication Administration: Whole meds with liquid (one at a time) Supervision: Patient able to self feed;Intermittent supervision to cue for compensatory strategies Compensations: Slow rate Postural Changes: Seated upright at 90 degrees    Other  Recommendations Oral Care Recommendations: Oral care BID    Recommendations for follow up therapy are one component of a multi-disciplinary discharge planning process, led by the attending physician.  Recommendations may be updated based on patient status, additional functional criteria and insurance authorization.  Follow up Recommendations No SLP follow up      Assistance Recommended at Discharge    Functional Status Assessment Patient has not had a recent decline in their functional status  Frequency and Duration            Prognosis        Swallow Study   General HPI: Pt is a 67 yo male presenting after recurrent syncope resulting in R 9th-11th rib fxs. Pt was made NPO and SLP was ordered when he was observed to be pocketing pills. PMH includes: GERD, prostate ca, HTN, neck surgery Type of Study: Bedside Swallow Evaluation Previous Swallow Assessment: none in chart Diet Prior to this Study: NPO Temperature Spikes Noted: No Respiratory Status: Room air History of Recent Intubation: No Behavior/Cognition: Alert;Cooperative;Pleasant mood Oral Cavity Assessment: Within Functional Limits Oral Care Completed by SLP: No Oral Cavity - Dentition: Missing dentition Vision: Functional for self-feeding Self-Feeding Abilities: Able to feed self Patient Positioning: Upright in bed Baseline Vocal Quality: Normal Volitional Cough: Weak (guarded secondary to rib pain) Volitional Swallow: Able to elicit  Oral/Motor/Sensory Function Overall Oral Motor/Sensory Function: Within functional limits   Ice Chips Ice chips: Not tested   Thin Liquid Thin Liquid: Within functional limits Presentation: Self Fed;Cup;Straw    Nectar  Thick Nectar Thick Liquid: Not tested   Honey Thick Honey Thick Liquid: Not tested   Puree Puree: Within functional limits Presentation: Self Fed;Spoon   Solid     Solid: Within functional limits Presentation: Self Fed      Mahala Menghini., M.A. CCC-SLP Acute Rehabilitation Services Office [phone]  Secure chat preferred  05/16/2023,10:44 AM

## 2023-05-16 NOTE — Plan of Care (Signed)
  Problem: Education: Goal: Knowledge of General Education information will improve Description: Including pain rating scale, medication(s)/side effects and non-pharmacologic comfort measures Outcome: Progressing   Problem: Health Behavior/Discharge Planning: Goal: Ability to manage health-related needs will improve Outcome: Progressing   Problem: Activity: Goal: Risk for activity intolerance will decrease Outcome: Progressing   Problem: Coping: Goal: Level of anxiety will decrease Outcome: Progressing   Problem: Elimination: Goal: Will not experience complications related to bowel motility Outcome: Progressing   

## 2023-05-16 NOTE — Plan of Care (Signed)

## 2023-05-16 NOTE — Progress Notes (Signed)
PROGRESS NOTE    Jeremy Hayden  HYQ:[id number] DOB: Oct 10, 1955 DOA: 05/15/2023 PCP: [facility]    Brief Narrative:  Jeremy Hayden is a 67 y.o. male with medical history significant of  CAD status post PCI and stent placement in 2013, prostate cancer, GERD, hypertension and recurrent syncope due to orthostatic hypotension who was discharged from the hospital on 1025 2:24 days stay for chest pain and syncopal episode.  He presented to ED today yet with another episode of passing out that happened today while he was in the bathroom.  Per patient, he did not have any prodromal symptoms such as chest pain, shortness of breath, palpitation, dizziness, blurry vision or any other complaint.  When he fell, he hit his right side of the upper abdomen/lower chest to the edge of the bathtub.  Per him, he thinks he was out for 10 to 15 minutes.  When he woke up, he called for help.   Assessment and Plan: Recurrent syncope secondary to orthostatic hypotension/also history of supine hypertension:  -  Orthostatic positive in the ED.  He was discharged on midodrine 5 mg p.o. twice daily during recent hospitalization -not complaint with ted hose either -seen by cardiology last admission: Recurrent syncope due to severe orthostatic hypotension.  I have discussed with the patient extensively regarding use of support stockings during the daytime while awake and to completely take them off at night when he sleeps.   Also advised him to avoid taking naps in the afternoon.   Would recommend starting him on midodrine 5 mg in the morning and 5 mg at 1 PM, he can also take additional dose in the late afternoon if he is aware that he will be going for a walk or his go to be on his feet.   This medication can be updated depending upon his symptoms.   Discontinue all antihypertensive medications.  Supine hypertension and orthostatic hypotension are difficult to treat.  Slightly increased salt intake    Right 9th through 11th rib fracture: Secondary to trauma that happened after syncope -lidocaine patch   GERD: PPI.   Elevated LFTs/hyperbilirubinemia:  -trended down  Possible T10 and T12 fracture: CT chest abdomen also shows slightly depressed appearance of the superior endplates of T10, X52, question subtle slight compression fracture.  Patient is not complaining of any back pain.      DVT prophylaxis: enoxaparin (LOVENOX) injection 40 mg Start: 05/15/23 1800    Code Status: Full Code   Disposition Plan:  Level of care: Telemetry Medical Status is: Observation     Consultants:  none   Subjective: Did not put on TED hose  Objective: Vitals:   05/15/23 2300 05/16/23 0047 05/16/23 0403 05/16/23 0801  BP: (!) 170/108 (!) 151/97 (!) 139/90 (!) 176/95  Pulse: 93 82 78 77  Resp: 19 19 18 17   Temp: 98.5 F (36.9 C) 98.9 F (37.2 C) 99.3 F (37.4 C) 98.1 F (36.7 C)  TempSrc: Oral Oral Oral Oral  SpO2:  99% 99% 100%  Weight:   55.8 kg   Height:        Intake/Output Summary (Last 24 hours) at 05/16/2023 1352 Last data filed at 05/16/2023 0300 Gross per 24 hour  Intake 650 ml  Output 400 ml  Net 250 ml   Filed Weights   05/16/23 0403  Weight: 55.8 kg    Examination:   General: Appearance:    Thin male in no acute distress  Lungs:     respirations unlabored  Heart:    Normal heart rate.     MS:   All extremities are intact.    Neurologic:   Awake, alert       Data Reviewed: I have personally reviewed following labs and imaging studies  CBC: Recent Labs  Lab 05/15/23 1201 05/15/23 1750  WBC  --  3.9*  HGB 13.3 12.9*  HCT 39.0 36.8*  MCV  --  103.1*  PLT  --  292   Basic Metabolic Panel: Recent Labs  Lab 05/15/23 1152 05/15/23 1201 05/15/23 1750 05/16/23 0600  NA 134* 133*  --  134*  K 4.1 4.6  --  3.2*  CL 98 97*  --  102  CO2 21*  --   --  24  GLUCOSE 88 88  --  76  BUN 29* 40*  --  18  CREATININE 1.24 1.20 1.15 1.04   CALCIUM 9.8  --   --  8.8*  MG 1.7  --   --   --    GFR: Estimated Creatinine Clearance: 53.7 mL/min (by C-G formula based on SCr of 1.04 mg/dL). Liver Function Tests: Recent Labs  Lab 05/15/23 1152 05/16/23 0600  AST 67* 47*  ALT 52* 40  ALKPHOS 116 97  BILITOT 3.4* 2.7*  PROT 7.2 6.2*  ALBUMIN 2.9* 2.5*   No results for input(s): "LIPASE", "AMYLASE" in the last 168 hours. Recent Labs  Lab 05/15/23 1152  AMMONIA 32   Coagulation Profile: No results for input(s): "INR", "PROTIME" in the last 168 hours. Cardiac Enzymes: No results for input(s): "CKTOTAL", "CKMB", "CKMBINDEX", "TROPONINI" in the last 168 hours. BNP (last 3 results) No results for input(s): "PROBNP" in the last 8760 hours. HbA1C: No results for input(s): "HGBA1C" in the last 72 hours. CBG: Recent Labs  Lab 05/15/23 1050  GLUCAP 92   Lipid Profile: No results for input(s): "CHOL", "HDL", "LDLCALC", "TRIG", "CHOLHDL", "LDLDIRECT" in the last 72 hours. Thyroid Function Tests: No results for input(s): "TSH", "T4TOTAL", "FREET4", "T3FREE", "THYROIDAB" in the last 72 hours. Anemia Panel: No results for input(s): "VITAMINB12", "FOLATE", "FERRITIN", "TIBC", "IRON", "RETICCTPCT" in the last 72 hours. Sepsis Labs: No results for input(s): "PROCALCITON", "LATICACIDVEN" in the last 168 hours.  No results found for this or any previous visit (from the past 240 hour(s)).       Radiology Studies: CT CHEST ABDOMEN PELVIS W CONTRAST  Result Date: 05/15/2023 CLINICAL DATA:  Polytrauma, blunt right sided pain, more to lower abdomen and hip. Loss of consciousness. EXAM: CT CHEST, ABDOMEN, AND PELVIS WITH CONTRAST TECHNIQUE: Multidetector CT imaging of the chest, abdomen and pelvis was performed following the standard protocol during bolus administration of intravenous contrast. RADIATION DOSE REDUCTION: This exam was performed according to the departmental dose-optimization program which includes automated exposure  control, adjustment of the mA and/or kV according to patient size and/or use of iterative reconstruction technique. CONTRAST:  75mL OMNIPAQUE IOHEXOL 350 MG/ML SOLN COMPARISON:  01/18/2019 FINDINGS: CT CHEST FINDINGS Cardiovascular: Heart is normal size. Aorta is normal caliber. Moderate coronary artery and aortic atherosclerosis. Mediastinum/Nodes: No mediastinal, hilar, or axillary adenopathy. Trachea and esophagus are unremarkable. Thyroid unremarkable. Lungs/Pleura: Linear bibasilar atelectasis. No effusions or pneumothorax. Musculoskeletal: Right posterolateral 9th through 11th rib fractures. No left rib fractures. Slight subtle depressed appearance in the superior endplates of T10 and T12, cannot exclude very subtle slight compression fractures. Chest wall soft tissues are unremarkable. CT ABDOMEN PELVIS FINDINGS Hepatobiliary: Diffuse low-density throughout  the liver compatible with fatty infiltration. No focal abnormality. Gallbladder unremarkable. No perihepatic hematoma. Pancreas: No focal abnormality or ductal dilatation. Spleen: No splenic injury or perisplenic hematoma. Adrenals/Urinary Tract: No adrenal hemorrhage or renal injury identified. Bladder is unremarkable. Stomach/Bowel: Normal appendix. Diffuse colonic diverticulosis. No active diverticulitis. Stomach and small bowel decompressed, unremarkable. Vascular/Lymphatic: Aortic atherosclerosis. No evidence of aneurysm or adenopathy. Reproductive: No visible focal abnormality. Other: No free fluid or free air. Musculoskeletal: No acute bony abnormality. IMPRESSION: Posterolateral right 9th through 11th rib fractures. No effusions or pneumothorax. Bibasilar atelectasis. Subtle slightly depressed appearance of the superior endplates of T10 and T12. Question subtle slight compression fractures. Recommend correlation for pain in these areas. Coronary artery disease, aortic atherosclerosis. Hepatic steatosis. Colonic diverticulosis. Electronically Signed    By: Charlett Nose M.D.   On: 05/15/2023 15:21   CT Head Wo Contrast  Result Date: 05/15/2023 CLINICAL DATA:  Neck trauma (Age >= 65y); Head trauma, minor (Age >= 65y) EXAM: CT HEAD WITHOUT CONTRAST CT CERVICAL SPINE WITHOUT CONTRAST TECHNIQUE: Multidetector CT imaging of the head and cervical spine was performed following the standard protocol without intravenous contrast. Multiplanar CT image reconstructions of the cervical spine were also generated. RADIATION DOSE REDUCTION: This exam was performed according to the departmental dose-optimization program which includes automated exposure control, adjustment of the mA and/or kV according to patient size and/or use of iterative reconstruction technique. COMPARISON:  CT Head and C Spine 01/24/23 FINDINGS: CT HEAD FINDINGS Brain: No hemorrhage. No hydrocephalus. No extra-axial fluid collection. No CT evidence of an acute cortical infarct. No mass effect. No mass lesion. Mineralization of the basal ganglia bilaterally. Sequela of mild chronic microvascular ischemic change. Vascular: No hyperdense vessel or unexpected calcification. Mildly ectatic appearance of the basilar artery, unchanged from prior Skull: Mild soft swelling over the posterior scalp. No evidence of an underlying calvarial fracture. Sinuses/Orbits: No middle ear or mastoid effusion. Paranasal sinuses are clear. Orbits are unremarkable. Other: None. CT CERVICAL SPINE FINDINGS Alignment: Normal. Skull base and vertebrae: Status post C4-C6 ACDF Soft tissues and spinal canal: No prevertebral fluid or swelling. No visible canal hematoma. Disc levels:  No evidence of high-grade spinal canal stenosis Upper chest: Negative. Other: None IMPRESSION: 1. No CT evidence of intracranial injury. 2. Mild soft tissue swelling over the posterior scalp. No evidence of an underlying calvarial fracture. 3. No acute fracture or traumatic subluxation of the cervical spine. Electronically Signed   By: Lorenza Cambridge M.D.    On: 05/15/2023 14:28   CT Cervical Spine Wo Contrast  Result Date: 05/15/2023 CLINICAL DATA:  Neck trauma (Age >= 65y); Head trauma, minor (Age >= 65y) EXAM: CT HEAD WITHOUT CONTRAST CT CERVICAL SPINE WITHOUT CONTRAST TECHNIQUE: Multidetector CT imaging of the head and cervical spine was performed following the standard protocol without intravenous contrast. Multiplanar CT image reconstructions of the cervical spine were also generated. RADIATION DOSE REDUCTION: This exam was performed according to the departmental dose-optimization program which includes automated exposure control, adjustment of the mA and/or kV according to patient size and/or use of iterative reconstruction technique. COMPARISON:  CT Head and C Spine 01/24/23 FINDINGS: CT HEAD FINDINGS Brain: No hemorrhage. No hydrocephalus. No extra-axial fluid collection. No CT evidence of an acute cortical infarct. No mass effect. No mass lesion. Mineralization of the basal ganglia bilaterally. Sequela of mild chronic microvascular ischemic change. Vascular: No hyperdense vessel or unexpected calcification. Mildly ectatic appearance of the basilar artery, unchanged from prior Skull: Mild soft swelling over the  posterior scalp. No evidence of an underlying calvarial fracture. Sinuses/Orbits: No middle ear or mastoid effusion. Paranasal sinuses are clear. Orbits are unremarkable. Other: None. CT CERVICAL SPINE FINDINGS Alignment: Normal. Skull base and vertebrae: Status post C4-C6 ACDF Soft tissues and spinal canal: No prevertebral fluid or swelling. No visible canal hematoma. Disc levels:  No evidence of high-grade spinal canal stenosis Upper chest: Negative. Other: None IMPRESSION: 1. No CT evidence of intracranial injury. 2. Mild soft tissue swelling over the posterior scalp. No evidence of an underlying calvarial fracture. 3. No acute fracture or traumatic subluxation of the cervical spine. Electronically Signed   By: Lorenza Cambridge M.D.   On: 05/15/2023  14:28   DG Pelvis Portable  Result Date: 05/15/2023 CLINICAL DATA:  Pain after fall EXAM: PORTABLE PELVIS 1 VIEWS COMPARISON:  None Available. FINDINGS: No fracture or dislocation. Preserved joint spaces. There is some sclerosis of the right sacroiliac joint. Scattered vascular calcifications. IMPRESSION: Osteopenia. Degenerative changes of the right sacroiliac joint right-greater-than-left. Electronically Signed   By: Karen Kays M.D.   On: 05/15/2023 14:12   DG Chest Portable 1 View  Result Date: 05/15/2023 CLINICAL DATA:  Pain after fall. EXAM: PORTABLE CHEST 1 VIEW COMPARISON:  X-ray 05/07/2023 and older. FINDINGS: Underinflation. No pneumothorax, effusion or edema. Normal cardiopericardial silhouette. Overlapping cardiac leads. IMPRESSION: Underinflation.  No acute cardiopulmonary disease. Electronically Signed   By: Karen Kays M.D.   On: 05/15/2023 14:11        Scheduled Meds:  enoxaparin (LOVENOX) injection  40 mg Subcutaneous Q24H   fentaNYL (SUBLIMAZE) injection  50 mcg Intravenous Once   lidocaine  1 patch Transdermal Daily   midodrine  5 mg Oral BID   pantoprazole  40 mg Oral Daily   sodium chloride flush  3 mL Intravenous Q12H   thiamine  100 mg Oral Daily   Continuous Infusions:   LOS: 0 days    Time spent: 45 minutes spent on chart review, discussion with nursing staff, consultants, updating family and interview/physical exam; more than 50% of that time was spent in counseling and/or coordination of care.    Joseph Art, DO [facility] Available via Epic secure chat 7am-7pm After these hours, please refer to coverage provider listed on [email or web address] 05/16/2023, 1:52 PM

## 2023-05-16 NOTE — Progress Notes (Signed)
*   Echocardiogram 2D Echocardiogram has been performed.  Jeremy Hayden 05/16/2023, 3:04 PM

## 2023-05-17 DIAGNOSIS — R55 Syncope and collapse: Secondary | ICD-10-CM | POA: Diagnosis not present

## 2023-05-17 LAB — CBC
HCT: 34.3 % — ABNORMAL LOW (ref 39.0–52.0)
Hemoglobin: 12.3 g/dL — ABNORMAL LOW (ref 13.0–17.0)
MCH: 36.6 pg — ABNORMAL HIGH (ref 26.0–34.0)
MCHC: 35.9 g/dL (ref 30.0–36.0)
MCV: 102.1 fL — ABNORMAL HIGH (ref 80.0–100.0)
Platelets: 269 10*3/uL (ref 150–400)
RBC: 3.36 MIL/uL — ABNORMAL LOW (ref 4.22–5.81)
RDW: 12.8 % (ref 11.5–15.5)
WBC: 3.4 10*3/uL — ABNORMAL LOW (ref 4.0–10.5)
nRBC: 0 % (ref 0.0–0.2)

## 2023-05-17 LAB — BASIC METABOLIC PANEL
Anion gap: 7 (ref 5–15)
BUN: 11 mg/dL (ref 8–23)
CO2: 24 mmol/L (ref 22–32)
Calcium: 9.4 mg/dL (ref 8.9–10.3)
Chloride: 104 mmol/L (ref 98–111)
Creatinine, Ser: 0.91 mg/dL (ref 0.61–1.24)
GFR, Estimated: >60 mL/min (ref >60)
Glucose, Bld: 128 mg/dL — ABNORMAL HIGH (ref 70–99)
Potassium: 3.4 mmol/L — ABNORMAL LOW (ref 3.5–5.1)
Sodium: 135 mmol/L (ref 135–145)

## 2023-05-17 MED ORDER — POTASSIUM CHLORIDE CRYS ER 20 MEQ PO TBCR
40.0000 meq | EXTENDED_RELEASE_TABLET | Freq: Once | ORAL | Status: AC
  Administered 2023-05-17: 40 meq via ORAL
  Filled 2023-05-17: qty 2

## 2023-05-17 NOTE — Plan of Care (Signed)

## 2023-05-17 NOTE — Progress Notes (Signed)
PROGRESS NOTE    Jeremy Hayden  WUJ:[id number] DOB: 1955-11-20 DOA: 05/15/2023 PCP: [facility]    Brief Narrative:  Jeremy Hayden is a 67 y.o. male with medical history significant of  CAD status post PCI and stent placement in 2013, prostate cancer, GERD, hypertension and recurrent syncope due to orthostatic hypotension who was discharged from the hospital on 1025 2:24 days stay for chest pain and syncopal episode.  He presented to ED today yet with another episode of passing out that happened today while he was in the bathroom.  Per patient, he did not have any prodromal symptoms such as chest pain, shortness of breath, palpitation, dizziness, blurry vision or any other complaint.  When he fell, he hit his right side of the upper abdomen/lower chest to the edge of the bathtub.  Per him, he thinks he was out for 10 to 15 minutes.  When he woke up, he called for help.   Assessment and Plan: Recurrent syncope secondary to orthostatic hypotension/also history of supine hypertension:  -  Orthostatic positive in the ED.  He was discharged on midodrine 5 mg p.o. twice daily during recent hospitalization -not complaint with ted hose either -seen by cardiology last admission: Recurrent syncope due to severe orthostatic hypotension.  I have discussed with the patient extensively regarding use of support stockings during the daytime while awake and to completely take them off at night when he sleeps Also advised him to avoid taking naps in the afternoon. Would recommend starting him on midodrine 5 mg in the morning and 5 mg at 1 PM, he can also take additional dose in the late afternoon if he is aware that he will be going for a walk or his go to be on his feet. This medication can be updated depending upon his symptoms. Discontinue all antihypertensive medications.  Supine hypertension and orthostatic hypotension are difficult to treat.  Slightly increased salt intake   -add abdominal  binder and recheck orthos -patient lives alone so concern for continued falls Right 9th through 11th rib fracture: Secondary to trauma that happened after syncope -lidocaine patch   GERD: PPI.   Hypokalemia -replete  Elevated LFTs/hyperbilirubinemia:  -trended down  Possible T10 and T12 fracture: CT chest abdomen also shows slightly depressed appearance of the superior endplates of T10, N56, question subtle slight compression fracture.  Patient is not complaining of any back pain.      DVT prophylaxis: Place TED hose Start: 05/16/23 1400 enoxaparin (LOVENOX) injection 40 mg Start: 05/15/23 1800    Code Status: Full Code   Disposition Plan:  Level of care: Telemetry Medical Status is: Observation     Consultants:  none   Subjective: Continues with + orthos Lives alone  Objective: Vitals:   05/16/23 2040 05/17/23 0441 05/17/23 0556 05/17/23 0725  BP:  (!) 179/106 91/73 116/83  Pulse:  67 80 79  Resp: 14 16 18 18   Temp:  98.1 F (36.7 C) 99.8 F (37.7 C) 97.9 F (36.6 C)  TempSrc:   Oral Oral  SpO2:  94% 100% 98%  Weight:   54.5 kg   Height:        Intake/Output Summary (Last 24 hours) at 05/17/2023 1156 Last data filed at 05/17/2023 1114 Gross per 24 hour  Intake 3 ml  Output 850 ml  Net -847 ml   Filed Weights   05/16/23 0403 05/17/23 0556  Weight: 55.8 kg 54.5 kg    Examination:   General: Appearance:  Thin male in no acute distress     Lungs:     respirations unlabored  Heart:    Normal heart rate.     MS:   All extremities are intact.    Neurologic:   Awake, alert       Data Reviewed: I have personally reviewed following labs and imaging studies  CBC: Recent Labs  Lab 05/15/23 1201 05/15/23 1750 05/17/23 0600  WBC  --  3.9* 3.4*  HGB 13.3 12.9* 12.3*  HCT 39.0 36.8* 34.3*  MCV  --  103.1* 102.1*  PLT  --  292 269   Basic Metabolic Panel: Recent Labs  Lab 05/15/23 1152 05/15/23 1201 05/15/23 1750 05/16/23 0600  05/17/23 0600  NA 134* 133*  --  134* 135  K 4.1 4.6  --  3.2* 3.4*  CL 98 97*  --  102 104  CO2 21*  --   --  24 24  GLUCOSE 88 88  --  76 128*  BUN 29* 40*  --  18 11  CREATININE 1.24 1.20 1.15 1.04 0.91  CALCIUM 9.8  --   --  8.8* 9.4  MG 1.7  --   --   --   --    GFR: Estimated Creatinine Clearance: 59.9 mL/min (by C-G formula based on SCr of 0.91 mg/dL). Liver Function Tests: Recent Labs  Lab 05/15/23 1152 05/16/23 0600  AST 67* 47*  ALT 52* 40  ALKPHOS 116 97  BILITOT 3.4* 2.7*  PROT 7.2 6.2*  ALBUMIN 2.9* 2.5*   No results for input(s): "LIPASE", "AMYLASE" in the last 168 hours. Recent Labs  Lab 05/15/23 1152  AMMONIA 32   Coagulation Profile: No results for input(s): "INR", "PROTIME" in the last 168 hours. Cardiac Enzymes: No results for input(s): "CKTOTAL", "CKMB", "CKMBINDEX", "TROPONINI" in the last 168 hours. BNP (last 3 results) No results for input(s): "PROBNP" in the last 8760 hours. HbA1C: No results for input(s): "HGBA1C" in the last 72 hours. CBG: Recent Labs  Lab 05/15/23 1050  GLUCAP 92   Lipid Profile: No results for input(s): "CHOL", "HDL", "LDLCALC", "TRIG", "CHOLHDL", "LDLDIRECT" in the last 72 hours. Thyroid Function Tests: No results for input(s): "TSH", "T4TOTAL", "FREET4", "T3FREE", "THYROIDAB" in the last 72 hours. Anemia Panel: No results for input(s): "VITAMINB12", "FOLATE", "FERRITIN", "TIBC", "IRON", "RETICCTPCT" in the last 72 hours. Sepsis Labs: No results for input(s): "PROCALCITON", "LATICACIDVEN" in the last 168 hours.  No results found for this or any previous visit (from the past 240 hour(s)).       Radiology Studies: ECHOCARDIOGRAM COMPLETE  Result Date: 05/16/2023    ECHOCARDIOGRAM REPORT   Patient Name:   Jeremy Hayden Date of Exam: 05/16/2023 Medical Rec #:  [id number]       Height:       66.0 in Accession #:    [id number]      Weight:       123.0 lb Date of Birth:  [birth year]        BSA:          1.627 m  Patient Age:    67 years        BP:           176/95 mmHg Patient Gender: M               HR:           88 bpm. Exam Location:  Inpatient Procedure: 2D Echo, Cardiac Doppler and Color Doppler Indications:  Syncope R55  History:        Patient has prior history of Echocardiogram examinations, most                 recent 04/06/2022. Arrythmias:Atrial Fibrillation,                 Signs/Symptoms:Chest Pain and Syncope; Risk Factors:Hypertension                 and Dyslipidemia.  Sonographer:    Lucendia Herrlich RCS Referring Phys: [id number] RAVI PAHWANI IMPRESSIONS  1. Left ventricular ejection fraction, by estimation, is >75%. The left ventricle has hyperdynamic function. The left ventricle has no regional wall motion abnormalities. There is mild left ventricular hypertrophy. Left ventricular diastolic parameters were normal.  2. Right ventricular systolic function is normal. The right ventricular size is normal.  3. The mitral valve is normal in structure. Trivial mitral valve regurgitation.  4. The aortic valve is tricuspid. Aortic valve regurgitation is not visualized.  5. The inferior vena cava is normal in size with greater than 50% respiratory variability, suggesting right atrial pressure of 3 mmHg. Comparison(s): The left ventricular function is unchanged. FINDINGS  Left Ventricle: Left ventricular ejection fraction, by estimation, is >75%. The left ventricle has hyperdynamic function. The left ventricle has no regional wall motion abnormalities. The left ventricular internal cavity size was small. There is mild left ventricular hypertrophy. Left ventricular diastolic parameters were normal. Right Ventricle: The right ventricular size is normal. Right vetricular wall thickness was not assessed. Right ventricular systolic function is normal. Left Atrium: Left atrial size was normal in size. Right Atrium: Right atrial size was normal in size. Pericardium: There is no evidence of pericardial effusion. Mitral Valve:  The mitral valve is normal in structure. Trivial mitral valve regurgitation. Tricuspid Valve: The tricuspid valve is normal in structure. Tricuspid valve regurgitation is mild. Aortic Valve: The aortic valve is tricuspid. Aortic valve regurgitation is not visualized. Aortic valve peak gradient measures 5.5 mmHg. Pulmonic Valve: The pulmonic valve was normal in structure. Pulmonic valve regurgitation is not visualized. Aorta: The aortic root and ascending aorta are structurally normal, with no evidence of dilitation. Venous: The inferior vena cava is normal in size with greater than 50% respiratory variability, suggesting right atrial pressure of 3 mmHg. IAS/Shunts: No atrial level shunt detected by color flow Doppler.  LEFT VENTRICLE PLAX 2D LVIDd:         3.30 cm   Diastology LVIDs:         2.00 cm   LV e' medial:    6.84 cm/s LV PW:         1.10 cm   LV E/e' medial:  8.0 LV IVS:        1.20 cm   LV e' lateral:   13.20 cm/s LVOT diam:     2.10 cm   LV E/e' lateral: 4.1 LV SV:         45 LV SV Index:   28 LVOT Area:     3.46 cm  RIGHT VENTRICLE             IVC RV S prime:     12.10 cm/s  IVC diam: 1.50 cm TAPSE (M-mode): 1.2 cm LEFT ATRIUM             Index        RIGHT ATRIUM          Index LA diam:        3.00  cm 1.84 cm/m   RA Area:     8.40 cm LA Vol (A2C):   27.2 ml 16.72 ml/m  RA Volume:   12.30 ml 7.56 ml/m LA Vol (A4C):   23.4 ml 14.39 ml/m LA Biplane Vol: 26.2 ml 16.11 ml/m  AORTIC VALVE AV Area (Vmax): 2.49 cm AV Vmax:        117.00 cm/s AV Peak Grad:   5.5 mmHg LVOT Vmax:      84.08 cm/s LVOT Vmean:     52.675 cm/s LVOT VTI:       0.131 m  AORTA Ao Root diam: 3.60 cm Ao Asc diam:  3.40 cm MITRAL VALVE               TRICUSPID VALVE MV Area (PHT): 3.60 cm    TR Peak grad:   19.5 mmHg MV Decel Time: 211 msec    TR Vmax:        221.00 cm/s MV E velocity: 54.70 cm/s MV A velocity: 76.00 cm/s  SHUNTS MV E/A ratio:  0.72        Systemic VTI:  0.13 m                            Systemic Diam: 2.10 cm  Dietrich Pates MD Electronically signed by Dietrich Pates MD Signature Date/Time: 05/16/2023/4:30:49 PM    Final    CT CHEST ABDOMEN PELVIS W CONTRAST  Result Date: 05/15/2023 CLINICAL DATA:  Polytrauma, blunt right sided pain, more to lower abdomen and hip. Loss of consciousness. EXAM: CT CHEST, ABDOMEN, AND PELVIS WITH CONTRAST TECHNIQUE: Multidetector CT imaging of the chest, abdomen and pelvis was performed following the standard protocol during bolus administration of intravenous contrast. RADIATION DOSE REDUCTION: This exam was performed according to the departmental dose-optimization program which includes automated exposure control, adjustment of the mA and/or kV according to patient size and/or use of iterative reconstruction technique. CONTRAST:  75mL OMNIPAQUE IOHEXOL 350 MG/ML SOLN COMPARISON:  01/18/2019 FINDINGS: CT CHEST FINDINGS Cardiovascular: Heart is normal size. Aorta is normal caliber. Moderate coronary artery and aortic atherosclerosis. Mediastinum/Nodes: No mediastinal, hilar, or axillary adenopathy. Trachea and esophagus are unremarkable. Thyroid unremarkable. Lungs/Pleura: Linear bibasilar atelectasis. No effusions or pneumothorax. Musculoskeletal: Right posterolateral 9th through 11th rib fractures. No left rib fractures. Slight subtle depressed appearance in the superior endplates of T10 and T12, cannot exclude very subtle slight compression fractures. Chest wall soft tissues are unremarkable. CT ABDOMEN PELVIS FINDINGS Hepatobiliary: Diffuse low-density throughout the liver compatible with fatty infiltration. No focal abnormality. Gallbladder unremarkable. No perihepatic hematoma. Pancreas: No focal abnormality or ductal dilatation. Spleen: No splenic injury or perisplenic hematoma. Adrenals/Urinary Tract: No adrenal hemorrhage or renal injury identified. Bladder is unremarkable. Stomach/Bowel: Normal appendix. Diffuse colonic diverticulosis. No active diverticulitis. Stomach and small bowel  decompressed, unremarkable. Vascular/Lymphatic: Aortic atherosclerosis. No evidence of aneurysm or adenopathy. Reproductive: No visible focal abnormality. Other: No free fluid or free air. Musculoskeletal: No acute bony abnormality. IMPRESSION: Posterolateral right 9th through 11th rib fractures. No effusions or pneumothorax. Bibasilar atelectasis. Subtle slightly depressed appearance of the superior endplates of T10 and T12. Question subtle slight compression fractures. Recommend correlation for pain in these areas. Coronary artery disease, aortic atherosclerosis. Hepatic steatosis. Colonic diverticulosis. Electronically Signed   By: Charlett Nose M.D.   On: 05/15/2023 15:21   CT Head Wo Contrast  Result Date: 05/15/2023 CLINICAL DATA:  Neck trauma (Age >= 65y); Head trauma, minor (Age >=  65y) EXAM: CT HEAD WITHOUT CONTRAST CT CERVICAL SPINE WITHOUT CONTRAST TECHNIQUE: Multidetector CT imaging of the head and cervical spine was performed following the standard protocol without intravenous contrast. Multiplanar CT image reconstructions of the cervical spine were also generated. RADIATION DOSE REDUCTION: This exam was performed according to the departmental dose-optimization program which includes automated exposure control, adjustment of the mA and/or kV according to patient size and/or use of iterative reconstruction technique. COMPARISON:  CT Head and C Spine 01/24/23 FINDINGS: CT HEAD FINDINGS Brain: No hemorrhage. No hydrocephalus. No extra-axial fluid collection. No CT evidence of an acute cortical infarct. No mass effect. No mass lesion. Mineralization of the basal ganglia bilaterally. Sequela of mild chronic microvascular ischemic change. Vascular: No hyperdense vessel or unexpected calcification. Mildly ectatic appearance of the basilar artery, unchanged from prior Skull: Mild soft swelling over the posterior scalp. No evidence of an underlying calvarial fracture. Sinuses/Orbits: No middle ear or mastoid  effusion. Paranasal sinuses are clear. Orbits are unremarkable. Other: None. CT CERVICAL SPINE FINDINGS Alignment: Normal. Skull base and vertebrae: Status post C4-C6 ACDF Soft tissues and spinal canal: No prevertebral fluid or swelling. No visible canal hematoma. Disc levels:  No evidence of high-grade spinal canal stenosis Upper chest: Negative. Other: None IMPRESSION: 1. No CT evidence of intracranial injury. 2. Mild soft tissue swelling over the posterior scalp. No evidence of an underlying calvarial fracture. 3. No acute fracture or traumatic subluxation of the cervical spine. Electronically Signed   By: Lorenza Cambridge M.D.   On: 05/15/2023 14:28   CT Cervical Spine Wo Contrast  Result Date: 05/15/2023 CLINICAL DATA:  Neck trauma (Age >= 65y); Head trauma, minor (Age >= 65y) EXAM: CT HEAD WITHOUT CONTRAST CT CERVICAL SPINE WITHOUT CONTRAST TECHNIQUE: Multidetector CT imaging of the head and cervical spine was performed following the standard protocol without intravenous contrast. Multiplanar CT image reconstructions of the cervical spine were also generated. RADIATION DOSE REDUCTION: This exam was performed according to the departmental dose-optimization program which includes automated exposure control, adjustment of the mA and/or kV according to patient size and/or use of iterative reconstruction technique. COMPARISON:  CT Head and C Spine 01/24/23 FINDINGS: CT HEAD FINDINGS Brain: No hemorrhage. No hydrocephalus. No extra-axial fluid collection. No CT evidence of an acute cortical infarct. No mass effect. No mass lesion. Mineralization of the basal ganglia bilaterally. Sequela of mild chronic microvascular ischemic change. Vascular: No hyperdense vessel or unexpected calcification. Mildly ectatic appearance of the basilar artery, unchanged from prior Skull: Mild soft swelling over the posterior scalp. No evidence of an underlying calvarial fracture. Sinuses/Orbits: No middle ear or mastoid effusion.  Paranasal sinuses are clear. Orbits are unremarkable. Other: None. CT CERVICAL SPINE FINDINGS Alignment: Normal. Skull base and vertebrae: Status post C4-C6 ACDF Soft tissues and spinal canal: No prevertebral fluid or swelling. No visible canal hematoma. Disc levels:  No evidence of high-grade spinal canal stenosis Upper chest: Negative. Other: None IMPRESSION: 1. No CT evidence of intracranial injury. 2. Mild soft tissue swelling over the posterior scalp. No evidence of an underlying calvarial fracture. 3. No acute fracture or traumatic subluxation of the cervical spine. Electronically Signed   By: Lorenza Cambridge M.D.   On: 05/15/2023 14:28        Scheduled Meds:  enoxaparin (LOVENOX) injection  40 mg Subcutaneous Q24H   lidocaine  1 patch Transdermal Daily   midodrine  5 mg Oral BID   pantoprazole  40 mg Oral Daily   sodium chloride flush  3 mL Intravenous Q12H   thiamine  100 mg Oral Daily   Continuous Infusions:   LOS: 0 days    Time spent: 45 minutes spent on chart review, discussion with nursing staff, consultants, updating family and interview/physical exam; more than 50% of that time was spent in counseling and/or coordination of care.    Joseph Art, DO [facility] Available via Epic secure chat 7am-7pm After these hours, please refer to coverage provider listed on [email or web address] 05/17/2023, 11:56 AM

## 2023-05-17 NOTE — Plan of Care (Signed)
  Problem: Nutrition: Goal: Adequate nutrition will be maintained Outcome: Progressing   Problem: Coping: Goal: Level of anxiety will decrease Outcome: Progressing   Problem: Pain Management: Goal: General experience of comfort will improve Outcome: Progressing

## 2023-05-18 DIAGNOSIS — S22078A Other fracture of T9-T10 vertebra, initial encounter for closed fracture: Secondary | ICD-10-CM

## 2023-05-18 LAB — BASIC METABOLIC PANEL
Anion gap: 9 (ref 5–15)
BUN: 10 mg/dL (ref 8–23)
CO2: 24 mmol/L (ref 22–32)
Calcium: 9.4 mg/dL (ref 8.9–10.3)
Chloride: 105 mmol/L (ref 98–111)
Creatinine, Ser: 0.97 mg/dL (ref 0.61–1.24)
GFR, Estimated: >60 mL/min (ref >60)
Glucose, Bld: 96 mg/dL (ref 70–99)
Potassium: 3.7 mmol/L (ref 3.5–5.1)
Sodium: 138 mmol/L (ref 135–145)

## 2023-05-18 MED ORDER — MIDODRINE HCL 5 MG PO TABS: 5.0000 mg | ORAL_TABLET | Freq: Every day | ORAL | Status: DC | PRN

## 2023-05-18 MED ORDER — MIDODRINE HCL 5 MG PO TABS
10.0000 mg | ORAL_TABLET | Freq: Two times a day (BID) | ORAL | Status: DC
  Administered 2023-05-18 – 2023-05-19 (×3): 10 mg via ORAL
  Filled 2023-05-18 (×2): qty 2

## 2023-05-18 NOTE — Plan of Care (Signed)
  Problem: Activity: Goal: Risk for activity intolerance will decrease Outcome: Progressing   Problem: Coping: Goal: Level of anxiety will decrease Outcome: Progressing   Problem: Elimination: Goal: Will not experience complications related to bowel motility Outcome: Progressing   Problem: Pain Management: Goal: General experience of comfort will improve Outcome: Progressing

## 2023-05-18 NOTE — Plan of Care (Signed)

## 2023-05-18 NOTE — Progress Notes (Signed)
PROGRESS NOTE    Jeremy Hayden  QMV:[id number] DOB: April 16, 1956 DOA: 05/15/2023 PCP: [facility]    Brief Narrative:  Jeremy Hayden is a 67 y.o. male with medical history significant of  CAD status post PCI and stent placement in 2013, prostate cancer, GERD, hypertension and recurrent syncope due to orthostatic hypotension who was discharged from the hospital on 1025 2:24 days stay for chest pain and syncopal episode.  He presented to ED today yet with another episode of passing out that happened today while he was in the bathroom.  Per patient, he did not have any prodromal symptoms such as chest pain, shortness of breath, palpitation, dizziness, blurry vision or any other complaint.  When he fell, he hit his right side of the upper abdomen/lower chest to the edge of the bathtub.  Per him, he thinks he was out for 10 to 15 minutes.  When he woke up, he called for help.   Assessment and Plan: Recurrent syncope secondary to orthostatic hypotension/also history of supine hypertension:  -  Orthostatic positive in the ED.  He was discharged on midodrine 5 mg p.o. twice daily during recent hospitalization -not complaint with ted hose either -seen by cardiology last admission: Recurrent syncope due to severe orthostatic hypotension.  I have discussed with the patient extensively regarding use of support stockings during the daytime while awake and to completely take them off at night when he sleeps Also advised him to avoid taking naps in the afternoon. Would recommend starting him on midodrine 5 mg in the morning and 5 mg at 1 PM, he can also take additional dose in the late afternoon if he is aware that he will be going for a walk or his go to be on his feet. This medication can be updated depending upon his symptoms. Discontinue all antihypertensive medications.  Supine hypertension and orthostatic hypotension are difficult to treat.  Slightly increased salt intake -add abdominal  binder and recheck orthos-- improved some with binder-- increase medication on 11/3 and recheck in AM -patient lives alone so concern for continued falls   Right 9th through 11th rib fracture: Secondary to trauma that happened after syncope -lidocaine patch   GERD: PPI.   Hypokalemia -replete  Elevated LFTs/hyperbilirubinemia:  -trended down  Possible T10 and T12 fracture: CT chest abdomen also shows slightly depressed appearance of the superior endplates of T10, M84, question subtle slight compression fracture.  Patient is not complaining of any back pain.      DVT prophylaxis: Place TED hose Start: 05/16/23 1400 enoxaparin (LOVENOX) injection 40 mg Start: 05/15/23 1800    Code Status: Full Code   Disposition Plan:  Level of care: Telemetry Medical Status is: Observation     Consultants:  none   Subjective: Continued issues with dizziness while standing  Objective: Vitals:   05/17/23 2128 05/18/23 0355 05/18/23 0500 05/18/23 1057  BP:  137/89  (!) 165/92  Pulse:  76  79  Resp: 19 18    Temp:  98.6 F (37 C)  97.8 F (36.6 C)  TempSrc:    Oral  SpO2:  99%    Weight:   52.5 kg   Height:        Intake/Output Summary (Last 24 hours) at 05/18/2023 1150 Last data filed at 05/18/2023 0554 Gross per 24 hour  Intake --  Output 700 ml  Net -700 ml   Filed Weights   05/16/23 0403 05/17/23 0556 05/18/23 0500  Weight: 55.8 kg  54.5 kg 52.5 kg    Examination:   General: Appearance:    Thin male in no acute distress     Lungs:     respirations unlabored  Heart:    Normal heart rate.     MS:   All extremities are intact.    Neurologic:   Awake, alert       Data Reviewed: I have personally reviewed following labs and imaging studies  CBC: Recent Labs  Lab 05/15/23 1201 05/15/23 1750 05/17/23 0600  WBC  --  3.9* 3.4*  HGB 13.3 12.9* 12.3*  HCT 39.0 36.8* 34.3*  MCV  --  103.1* 102.1*  PLT  --  292 269   Basic Metabolic Panel: Recent Labs  Lab  05/15/23 1152 05/15/23 1201 05/15/23 1750 05/16/23 0600 05/17/23 0600 05/18/23 0503  NA 134* 133*  --  134* 135 138  K 4.1 4.6  --  3.2* 3.4* 3.7  CL 98 97*  --  102 104 105  CO2 21*  --   --  24 24 24   GLUCOSE 88 88  --  76 128* 96  BUN 29* 40*  --  18 11 10   CREATININE 1.24 1.20 1.15 1.04 0.91 0.97  CALCIUM 9.8  --   --  8.8* 9.4 9.4  MG 1.7  --   --   --   --   --    GFR: Estimated Creatinine Clearance: 54.1 mL/min (by C-G formula based on SCr of 0.97 mg/dL). Liver Function Tests: Recent Labs  Lab 05/15/23 1152 05/16/23 0600  AST 67* 47*  ALT 52* 40  ALKPHOS 116 97  BILITOT 3.4* 2.7*  PROT 7.2 6.2*  ALBUMIN 2.9* 2.5*   No results for input(s): "LIPASE", "AMYLASE" in the last 168 hours. Recent Labs  Lab 05/15/23 1152  AMMONIA 32   Coagulation Profile: No results for input(s): "INR", "PROTIME" in the last 168 hours. Cardiac Enzymes: No results for input(s): "CKTOTAL", "CKMB", "CKMBINDEX", "TROPONINI" in the last 168 hours. BNP (last 3 results) No results for input(s): "PROBNP" in the last 8760 hours. HbA1C: No results for input(s): "HGBA1C" in the last 72 hours. CBG: Recent Labs  Lab 05/15/23 1050  GLUCAP 92   Lipid Profile: No results for input(s): "CHOL", "HDL", "LDLCALC", "TRIG", "CHOLHDL", "LDLDIRECT" in the last 72 hours. Thyroid Function Tests: No results for input(s): "TSH", "T4TOTAL", "FREET4", "T3FREE", "THYROIDAB" in the last 72 hours. Anemia Panel: No results for input(s): "VITAMINB12", "FOLATE", "FERRITIN", "TIBC", "IRON", "RETICCTPCT" in the last 72 hours. Sepsis Labs: No results for input(s): "PROCALCITON", "LATICACIDVEN" in the last 168 hours.  No results found for this or any previous visit (from the past 240 hour(s)).       Radiology Studies: ECHOCARDIOGRAM COMPLETE  Result Date: 05/16/2023    ECHOCARDIOGRAM REPORT   Patient Name:   Jeremy Hayden Date of Exam: 05/16/2023 Medical Rec #:  [id number]       Height:       66.0 in  Accession #:    [id number]      Weight:       123.0 lb Date of Birth:  [birth year]        BSA:          1.627 m Patient Age:    67 years        BP:           176/95 mmHg Patient Gender: M  HR:           88 bpm. Exam Location:  Inpatient Procedure: 2D Echo, Cardiac Doppler and Color Doppler Indications:    Syncope R55  History:        Patient has prior history of Echocardiogram examinations, most                 recent 04/06/2022. Arrythmias:Atrial Fibrillation,                 Signs/Symptoms:Chest Pain and Syncope; Risk Factors:Hypertension                 and Dyslipidemia.  Sonographer:    Lucendia Herrlich RCS Referring Phys: [id number] RAVI PAHWANI IMPRESSIONS  1. Left ventricular ejection fraction, by estimation, is >75%. The left ventricle has hyperdynamic function. The left ventricle has no regional wall motion abnormalities. There is mild left ventricular hypertrophy. Left ventricular diastolic parameters were normal.  2. Right ventricular systolic function is normal. The right ventricular size is normal.  3. The mitral valve is normal in structure. Trivial mitral valve regurgitation.  4. The aortic valve is tricuspid. Aortic valve regurgitation is not visualized.  5. The inferior vena cava is normal in size with greater than 50% respiratory variability, suggesting right atrial pressure of 3 mmHg. Comparison(s): The left ventricular function is unchanged. FINDINGS  Left Ventricle: Left ventricular ejection fraction, by estimation, is >75%. The left ventricle has hyperdynamic function. The left ventricle has no regional wall motion abnormalities. The left ventricular internal cavity size was small. There is mild left ventricular hypertrophy. Left ventricular diastolic parameters were normal. Right Ventricle: The right ventricular size is normal. Right vetricular wall thickness was not assessed. Right ventricular systolic function is normal. Left Atrium: Left atrial size was normal in size. Right Atrium:  Right atrial size was normal in size. Pericardium: There is no evidence of pericardial effusion. Mitral Valve: The mitral valve is normal in structure. Trivial mitral valve regurgitation. Tricuspid Valve: The tricuspid valve is normal in structure. Tricuspid valve regurgitation is mild. Aortic Valve: The aortic valve is tricuspid. Aortic valve regurgitation is not visualized. Aortic valve peak gradient measures 5.5 mmHg. Pulmonic Valve: The pulmonic valve was normal in structure. Pulmonic valve regurgitation is not visualized. Aorta: The aortic root and ascending aorta are structurally normal, with no evidence of dilitation. Venous: The inferior vena cava is normal in size with greater than 50% respiratory variability, suggesting right atrial pressure of 3 mmHg. IAS/Shunts: No atrial level shunt detected by color flow Doppler.  LEFT VENTRICLE PLAX 2D LVIDd:         3.30 cm   Diastology LVIDs:         2.00 cm   LV e' medial:    6.84 cm/s LV PW:         1.10 cm   LV E/e' medial:  8.0 LV IVS:        1.20 cm   LV e' lateral:   13.20 cm/s LVOT diam:     2.10 cm   LV E/e' lateral: 4.1 LV SV:         45 LV SV Index:   28 LVOT Area:     3.46 cm  RIGHT VENTRICLE             IVC RV S prime:     12.10 cm/s  IVC diam: 1.50 cm TAPSE (M-mode): 1.2 cm LEFT ATRIUM             Index  RIGHT ATRIUM          Index LA diam:        3.00 cm 1.84 cm/m   RA Area:     8.40 cm LA Vol (A2C):   27.2 ml 16.72 ml/m  RA Volume:   12.30 ml 7.56 ml/m LA Vol (A4C):   23.4 ml 14.39 ml/m LA Biplane Vol: 26.2 ml 16.11 ml/m  AORTIC VALVE AV Area (Vmax): 2.49 cm AV Vmax:        117.00 cm/s AV Peak Grad:   5.5 mmHg LVOT Vmax:      84.08 cm/s LVOT Vmean:     52.675 cm/s LVOT VTI:       0.131 m  AORTA Ao Root diam: 3.60 cm Ao Asc diam:  3.40 cm MITRAL VALVE               TRICUSPID VALVE MV Area (PHT): 3.60 cm    TR Peak grad:   19.5 mmHg MV Decel Time: 211 msec    TR Vmax:        221.00 cm/s MV E velocity: 54.70 cm/s MV A velocity: 76.00 cm/s   SHUNTS MV E/A ratio:  0.72        Systemic VTI:  0.13 m                            Systemic Diam: 2.10 cm Dietrich Pates MD Electronically signed by Dietrich Pates MD Signature Date/Time: 05/16/2023/4:30:49 PM    Final         Scheduled Meds:  enoxaparin (LOVENOX) injection  40 mg Subcutaneous Q24H   lidocaine  1 patch Transdermal Daily   midodrine  10 mg Oral BID   pantoprazole  40 mg Oral Daily   sodium chloride flush  3 mL Intravenous Q12H   thiamine  100 mg Oral Daily   Continuous Infusions:   LOS: 0 days    Time spent: 45 minutes spent on chart review, discussion with nursing staff, consultants, updating family and interview/physical exam; more than 50% of that time was spent in counseling and/or coordination of care.    Joseph Art, DO [facility] Available via Epic secure chat 7am-7pm After these hours, please refer to coverage provider listed on [email or web address] 05/18/2023, 11:50 AM

## 2023-05-19 DIAGNOSIS — S22078A Other fracture of T9-T10 vertebra, initial encounter for closed fracture: Secondary | ICD-10-CM | POA: Diagnosis not present

## 2023-05-19 DIAGNOSIS — I10 Essential (primary) hypertension: Secondary | ICD-10-CM | POA: Diagnosis present

## 2023-05-19 DIAGNOSIS — I951 Orthostatic hypotension: Secondary | ICD-10-CM | POA: Diagnosis present

## 2023-05-19 DIAGNOSIS — Y92002 Bathroom of unspecified non-institutional (private) residence single-family (private) house as the place of occurrence of the external cause: Secondary | ICD-10-CM | POA: Diagnosis not present

## 2023-05-19 DIAGNOSIS — E785 Hyperlipidemia, unspecified: Secondary | ICD-10-CM | POA: Diagnosis present

## 2023-05-19 DIAGNOSIS — E876 Hypokalemia: Secondary | ICD-10-CM | POA: Diagnosis not present

## 2023-05-19 DIAGNOSIS — K219 Gastro-esophageal reflux disease without esophagitis: Secondary | ICD-10-CM | POA: Diagnosis present

## 2023-05-19 DIAGNOSIS — Z8042 Family history of malignant neoplasm of prostate: Secondary | ICD-10-CM | POA: Diagnosis not present

## 2023-05-19 DIAGNOSIS — R55 Syncope and collapse: Secondary | ICD-10-CM | POA: Diagnosis present

## 2023-05-19 DIAGNOSIS — Z8546 Personal history of malignant neoplasm of prostate: Secondary | ICD-10-CM | POA: Diagnosis not present

## 2023-05-19 DIAGNOSIS — S22088A Other fracture of T11-T12 vertebra, initial encounter for closed fracture: Secondary | ICD-10-CM | POA: Diagnosis not present

## 2023-05-19 DIAGNOSIS — K760 Fatty (change of) liver, not elsewhere classified: Secondary | ICD-10-CM | POA: Diagnosis present

## 2023-05-19 DIAGNOSIS — S22089A Unspecified fracture of T11-T12 vertebra, initial encounter for closed fracture: Secondary | ICD-10-CM | POA: Diagnosis present

## 2023-05-19 DIAGNOSIS — Z832 Family history of diseases of the blood and blood-forming organs and certain disorders involving the immune mechanism: Secondary | ICD-10-CM | POA: Diagnosis not present

## 2023-05-19 DIAGNOSIS — I2510 Atherosclerotic heart disease of native coronary artery without angina pectoris: Secondary | ICD-10-CM | POA: Diagnosis present

## 2023-05-19 DIAGNOSIS — Z808 Family history of malignant neoplasm of other organs or systems: Secondary | ICD-10-CM | POA: Diagnosis not present

## 2023-05-19 DIAGNOSIS — Z955 Presence of coronary angioplasty implant and graft: Secondary | ICD-10-CM | POA: Diagnosis not present

## 2023-05-19 DIAGNOSIS — S2241XA Multiple fractures of ribs, right side, initial encounter for closed fracture: Secondary | ICD-10-CM | POA: Diagnosis present

## 2023-05-19 DIAGNOSIS — Z79899 Other long term (current) drug therapy: Secondary | ICD-10-CM | POA: Diagnosis not present

## 2023-05-19 DIAGNOSIS — W19XXXA Unspecified fall, initial encounter: Secondary | ICD-10-CM | POA: Diagnosis present

## 2023-05-19 MED ORDER — SODIUM CHLORIDE 0.9 % IV SOLN: INTRAVENOUS | Status: DC

## 2023-05-19 MED ORDER — SODIUM CHLORIDE 0.9 % IV SOLN: INTRAVENOUS | Status: AC

## 2023-05-19 MED ORDER — MIDODRINE HCL 5 MG PO TABS
10.0000 mg | ORAL_TABLET | Freq: Three times a day (TID) | ORAL | Status: DC
  Administered 2023-05-19 – 2023-05-20 (×4): 10 mg via ORAL
  Filled 2023-05-19 (×4): qty 2

## 2023-05-19 NOTE — Plan of Care (Signed)

## 2023-05-19 NOTE — Evaluation (Signed)
Physical Therapy Re-Evaluation Patient Details Name: Jeremy Hayden MRN: [id number] DOB: 20-Jan-1956 Today's Date: 05/19/2023  History of Present Illness  Pt is a 67 y.o. male admitted 10/31 for syncope episode in bathroom hit right flank side. Chest CT: Posterolateral right 9th through 11th rib fractures. Subtle slightly depressed appearance of the superior endplates of  T10 and T12. Question subtle slight compression fractures. PMHx: CAD status post PCI and stent placement in 2013, prostate cancer, GERD, HTN.   Clinical Impression  Physical therapy re-consulted following continued complaints of dizziness, difficulty with mobility and increased fall risk. Concern for orthostatic hypotension (see findings below.) Pt was ambulating 250 feet with minimal symptoms at initial PT evaluation. Today, after donning compression stockings and abdominal binder, he was symptomatic with dizziness upon standing with LEs trembling and too weak to ambulate. He required min assist for bed mobility, sit to stand transition, and step pivot transfer. Rt side rib pain. Cognition delayed. Due to limited family support and functional decline, limited mobility, and symptomatic hypotension pt would benefit from d/c to SNF before returning home. Patient will continue to benefit from skilled physical therapy services to further improve independence with functional mobility. Patient will benefit from continued inpatient follow up therapy, <3 hours/day    Symptomatic dizziness (severe) in standing.  05/19/23 1453  Orthostatic Lying   BP- Lying (!) 161/100  Pulse- Lying 80  Orthostatic Sitting  BP- Sitting 129/89  Pulse- Sitting 84  Orthostatic Standing at 0 minutes  BP- Standing at 0 minutes (!) 88/62  Pulse- Standing at 0 minutes 87  Orthostatic Standing at 3 minutes  BP- Standing at 3 minutes 107/79  Pulse- Standing at 3 minutes 93   After sitting down after 3 min test, pt attempted to stand and ambulate  unsuccessfully, BP checked again at 1 min (86/64 HR 99.) Assisted to chair, reclined slightly with LEs elevated BP 126/88 HR 91.      If plan is discharge home, recommend the following: Assist for transportation;A little help with walking and/or transfers;A little help with bathing/dressing/bathroom;Assistance with cooking/housework;Direct supervision/assist for medications management;Direct supervision/assist for financial management;Help with stairs or ramp for entrance;Supervision due to cognitive status   Can travel by private vehicle   Yes    Equipment Recommendations None recommended by PT  Recommendations for Other Services       Functional Status Assessment Patient has had a recent decline in their functional status and demonstrates the ability to make significant improvements in function in a reasonable and predictable amount of time.     Precautions / Restrictions Precautions Precautions: Fall;Back Precaution Comments: watch BP. rib fxs Rt, compression fxs spine Restrictions Weight Bearing Restrictions: No      Mobility  Bed Mobility Overal bed mobility: Needs Assistance Bed Mobility: Rolling, Sidelying to Sit, Sit to Sidelying Rolling: Min assist Sidelying to sit: Min assist     Sit to sidelying: Min assist General bed mobility comments: Min assist to roll left and right, assisted with donning abdominal binder and compression stockings. Min assist for trunk support to rise to EOB, and LE support to lower back to sidelying position. Cues throughout, delayed and requires verbal and tactile cues to sequence.    Transfers Overall transfer level: Needs assistance Equipment used: Rolling walker (2 wheels) Transfers: Sit to/from Stand, Bed to chair/wheelchair/BSC Sit to Stand: Min assist   Step pivot transfers: Min assist       General transfer comment: Min assist for boost to stand, Min assist for  balance with RW for step pivot transfer to recliner, trunk flexed,  cues for technique, pt feels LEs unsteady and tremulous.    Ambulation/Gait               General Gait Details: Too dizzy  Stairs            Wheelchair Mobility     Tilt Bed    Modified Rankin (Stroke Patients Only)       Balance Overall balance assessment: Mild deficits observed, not formally tested Sitting-balance support: Feet supported, No upper extremity supported Sitting balance-Leahy Scale: Fair     Standing balance support: Bilateral upper extremity supported, Reliant on assistive device for balance Standing balance-Leahy Scale: Poor Standing balance comment: BIL UE support, LEs tremulous, CGA                             Pertinent Vitals/Pain Pain Assessment Pain Assessment: Faces Faces Pain Scale: Hurts even more Pain Location: R side/ribs Pain Descriptors / Indicators: Aching, Sore Pain Intervention(s): Monitored during session, Repositioned, Limited activity within patient's tolerance    Home Living Family/patient expects to be discharged to:: Private residence Living Arrangements: Alone Available Help at Discharge: Family;Available PRN/intermittently (son checks on him daily) Type of Home: Apartment Home Access: Stairs to enter Entrance Stairs-Rails: Right Entrance Stairs-Number of Steps: 1   Home Layout: One level Home Equipment: Rollator (4 wheels);Cane - single point      Prior Function Prior Level of Function : Independent/Modified Independent             Mobility Comments: No AD, hx of falls (seemingly due to orthostatic hypotension) ADLs Comments: Does not drive, brother or nephew provides transportation to pt; pt sometimes walks to stores     Extremity/Trunk Assessment   Upper Extremity Assessment Upper Extremity Assessment: Defer to OT evaluation    Lower Extremity Assessment Lower Extremity Assessment: Generalized weakness    Cervical / Trunk Assessment Cervical / Trunk Assessment: Normal   Communication   Communication Communication: No apparent difficulties  Cognition Arousal: Alert Behavior During Therapy: Flat affect Overall Cognitive Status: No family/caregiver present to determine baseline cognitive functioning                                 General Comments: Delayed processing, incorrect month and year but able to self correct. Appears to have different presentation from initial evaluation reported by PT.        General Comments General comments (skin integrity, edema, etc.): See orthostatics tab. Assisted to donne compression stockings and abd binder. Pt subjectively very dizzy with standing positions. After 3 min BP, pt sat to rest, stood again but felt worse and could not progess with gait.    Exercises General Exercises - Lower Extremity Ankle Circles/Pumps: AROM, Both, 10 reps, Seated, Standing   Assessment/Plan    PT Assessment Patient needs continued PT services  PT Problem List Decreased activity tolerance;Decreased balance;Decreased mobility;Decreased knowledge of precautions;Cardiopulmonary status limiting activity;Decreased strength;Decreased cognition       PT Treatment Interventions Gait training;DME instruction;Stair training;Functional mobility training;Therapeutic activities;Therapeutic exercise;Balance training;Neuromuscular re-education;Patient/family education;Cognitive remediation    PT Goals (Current goals can be found in the Care Plan section)  Acute Rehab PT Goals Patient Stated Goal: Get well before returning home PT Goal Formulation: With patient Time For Goal Achievement: 06/02/23 Potential to Achieve Goals: Good    Frequency  Min 1X/week     Co-evaluation               AM-PAC PT "6 Clicks" Mobility  Outcome Measure Help needed turning from your back to your side while in a flat bed without using bedrails?: A Little Help needed moving from lying on your back to sitting on the side of a flat bed without  using bedrails?: A Little Help needed moving to and from a bed to a chair (including a wheelchair)?: A Little Help needed standing up from a chair using your arms (e.g., wheelchair or bedside chair)?: A Little Help needed to walk in hospital room?: A Lot Help needed climbing 3-5 steps with a railing? : Total 6 Click Score: 15    End of Session Equipment Utilized During Treatment: Gait belt (abd binder, compression stockings) Activity Tolerance: Other (comment) (Limited by dizziness, hypotension) Patient left: in chair;with call bell/phone within reach;with chair alarm set (reclined) Nurse Communication: Mobility status PT Visit Diagnosis: Unsteadiness on feet (R26.81);Other abnormalities of gait and mobility (R26.89);History of falling (Z91.81);Dizziness and giddiness (R42);Muscle weakness (generalized) (M62.81);Difficulty in walking, not elsewhere classified (R26.2)    Time: 1308-6578 PT Time Calculation (min) (ACUTE ONLY): 36 min   Charges:   PT Evaluation $PT Re-evaluation: 1 Re-eval PT Treatments $Therapeutic Activity: 8-22 mins PT General Charges $$ ACUTE PT VISIT: 1 Visit         Kathlyn Sacramento, PT, DPT [facility]  Rehabilitation Services Physical Therapist Office: [phone] Website: [email or web address]   Berton Mount 05/19/2023, 4:29 PM

## 2023-05-19 NOTE — Progress Notes (Signed)
Mobility Specialist Progress Note:    05/19/23 0901  Orthostatic Lying   BP- Lying (!) 152/106  Pulse- Lying 79  Orthostatic Sitting  BP- Sitting 125/87  Pulse- Sitting 82  Orthostatic Standing at 0 minutes  BP- Standing at 0 minutes (!) 72/57  Pulse- Standing at 0 minutes 88  Mobility  Activity Dangled on edge of bed;Stood at bedside  Level of Assistance Contact guard assist, steadying assist  Assistive Device Front wheel walker  Activity Response Tolerated well  Mobility Referral Yes  $Mobility charge 1 Mobility  Mobility Specialist Start Time (ACUTE ONLY) 0901  Mobility Specialist Stop Time (ACUTE ONLY) 0915  Mobility Specialist Time Calculation (min) (ACUTE ONLY) 14 min   Pt received supine in bed, agreeable to mobility session. Recorded BP for orthostatics. Required CGA for safety and RW for stability when standing at bedside. Compression stockings on.  Pt c/o tingling" in legs after standing 1 minute, otherwise asx throughout. Encouraged and assisted pt to return to bed. Final BP: 120/82 (94), HR 86 bpm. Left with all needs met, phone and call bell in reach.   Feliciana Rossetti Mobility Specialist Please contact via Special educational needs teacher or  Rehab office at [phone]

## 2023-05-19 NOTE — Progress Notes (Signed)
   05/19/23 1453  Orthostatic Lying   BP- Lying (!) 161/100  Pulse- Lying 80  Orthostatic Sitting  BP- Sitting 129/89  Pulse- Sitting 84  Orthostatic Standing at 0 minutes  BP- Standing at 0 minutes (!) 88/62  Pulse- Standing at 0 minutes 87  Orthostatic Standing at 3 minutes  BP- Standing at 3 minutes 107/79  Pulse- Standing at 3 minutes 93

## 2023-05-19 NOTE — Progress Notes (Signed)
PROGRESS NOTE    Jeremy Hayden  UJW:[id number] DOB: Mar 11, 1956 DOA: 05/15/2023 PCP: [facility]    Brief Narrative:  Jeremy Hayden is a 67 y.o. male with medical history significant of  CAD status post PCI and stent placement in 2013, prostate cancer, GERD, hypertension and recurrent syncope due to orthostatic hypotension who was discharged from the hospital on 1025 2:24 days stay for chest pain and syncopal episode.  He presented to ED today yet with another episode of passing out that happened today while he was in the bathroom.  Per patient, he did not have any prodromal symptoms such as chest pain, shortness of breath, palpitation, dizziness, blurry vision or any other complaint.  When he fell, he hit his right side of the upper abdomen/lower chest to the edge of the bathtub.  Per him, he thinks he was out for 10 to 15 minutes.  When he woke up, he called for help.   Assessment and Plan: Recurrent syncope secondary to orthostatic hypotension/also history of supine hypertension:  -  Orthostatic positive in the ED.  He was discharged on midodrine 5 mg p.o. twice daily during recent hospitalization -not complaint with ted hose either -seen by cardiology last admission: Recurrent syncope due to severe orthostatic hypotension.  I have discussed with the patient extensively regarding use of support stockings during the daytime while awake and to completely take them off at night when he sleeps Also advised him to avoid taking naps in the afternoon. Would recommend starting him on midodrine 5 mg in the morning and 5 mg at 1 PM, he can also take additional dose in the late afternoon if he is aware that he will be going for a walk or his go to be on his feet. This medication can be updated depending upon his symptoms. Discontinue all antihypertensive medications.  Supine hypertension and orthostatic hypotension are difficult to treat.  Slightly increased salt intake -added abdominal  binder and recheck orthos-- improved some with binder-- increase medication on 11/3 and upon recheck STILL Orthostatic-- will give IVF, increase midodrine now to TID and will consideration to adding florinef -patient lives alone so concern for continued falls -asked PT to reval due to above concern or living alone  Right 9th through 11th rib fracture: Secondary to trauma that happened after syncope -lidocaine patch   GERD: PPI.   Hypokalemia -replete  Elevated LFTs/hyperbilirubinemia:  -trended down  Possible T10 and T12 fracture: CT chest abdomen also shows slightly depressed appearance of the superior endplates of T10, F62, question subtle slight compression fracture.  Patient is not complaining of any back pain.      DVT prophylaxis: Place TED hose Start: 05/16/23 1400 enoxaparin (LOVENOX) injection 40 mg Start: 05/15/23 1800    Code Status: Full Code   Disposition Plan:  Level of care: Telemetry Medical Status is: Observation     Consultants:  none   Subjective: Continues to have dizziness with standing  Objective: Vitals:   05/19/23 0412 05/19/23 0500 05/19/23 0839 05/19/23 1141  BP: (!) 146/95  (!) 152/93 (!) 158/102  Pulse: 77  83 66  Resp: 18     Temp: 98 F (36.7 C)  97.9 F (36.6 C) 97.7 F (36.5 C)  TempSrc:   Oral Oral  SpO2: 99%  99% 100%  Weight:  51.3 kg    Height:        Intake/Output Summary (Last 24 hours) at 05/19/2023 1356 Last data filed at 05/19/2023 1308 Gross  per 24 hour  Intake --  Output 100 ml  Net -100 ml   Filed Weights   05/17/23 0556 05/18/23 0500 05/19/23 0500  Weight: 54.5 kg 52.5 kg 51.3 kg    Examination:   General: Appearance:    Thin male in no acute distress     Lungs:     respirations unlabored  Heart:    Normal heart rate.     MS:   All extremities are intact.    Neurologic:   Awake, alert       Data Reviewed: I have personally reviewed following labs and imaging studies  CBC: Recent Labs  Lab  05/15/23 1201 05/15/23 1750 05/17/23 0600  WBC  --  3.9* 3.4*  HGB 13.3 12.9* 12.3*  HCT 39.0 36.8* 34.3*  MCV  --  103.1* 102.1*  PLT  --  292 269   Basic Metabolic Panel: Recent Labs  Lab 05/15/23 1152 05/15/23 1201 05/15/23 1750 05/16/23 0600 05/17/23 0600 05/18/23 0503  NA 134* 133*  --  134* 135 138  K 4.1 4.6  --  3.2* 3.4* 3.7  CL 98 97*  --  102 104 105  CO2 21*  --   --  24 24 24   GLUCOSE 88 88  --  76 128* 96  BUN 29* 40*  --  18 11 10   CREATININE 1.24 1.20 1.15 1.04 0.91 0.97  CALCIUM 9.8  --   --  8.8* 9.4 9.4  MG 1.7  --   --   --   --   --    GFR: Estimated Creatinine Clearance: 52.9 mL/min (by C-G formula based on SCr of 0.97 mg/dL). Liver Function Tests: Recent Labs  Lab 05/15/23 1152 05/16/23 0600  AST 67* 47*  ALT 52* 40  ALKPHOS 116 97  BILITOT 3.4* 2.7*  PROT 7.2 6.2*  ALBUMIN 2.9* 2.5*   No results for input(s): "LIPASE", "AMYLASE" in the last 168 hours. Recent Labs  Lab 05/15/23 1152  AMMONIA 32   Coagulation Profile: No results for input(s): "INR", "PROTIME" in the last 168 hours. Cardiac Enzymes: No results for input(s): "CKTOTAL", "CKMB", "CKMBINDEX", "TROPONINI" in the last 168 hours. BNP (last 3 results) No results for input(s): "PROBNP" in the last 8760 hours. HbA1C: No results for input(s): "HGBA1C" in the last 72 hours. CBG: Recent Labs  Lab 05/15/23 1050  GLUCAP 92   Lipid Profile: No results for input(s): "CHOL", "HDL", "LDLCALC", "TRIG", "CHOLHDL", "LDLDIRECT" in the last 72 hours. Thyroid Function Tests: No results for input(s): "TSH", "T4TOTAL", "FREET4", "T3FREE", "THYROIDAB" in the last 72 hours. Anemia Panel: No results for input(s): "VITAMINB12", "FOLATE", "FERRITIN", "TIBC", "IRON", "RETICCTPCT" in the last 72 hours. Sepsis Labs: No results for input(s): "PROCALCITON", "LATICACIDVEN" in the last 168 hours.  No results found for this or any previous visit (from the past 240 hour(s)).       Radiology  Studies: No results found.      Scheduled Meds:  enoxaparin (LOVENOX) injection  40 mg Subcutaneous Q24H   lidocaine  1 patch Transdermal Daily   midodrine  10 mg Oral TID WC   pantoprazole  40 mg Oral Daily   sodium chloride flush  3 mL Intravenous Q12H   thiamine  100 mg Oral Daily   Continuous Infusions:  sodium chloride       LOS: 0 days    Time spent: 45 minutes spent on chart review, discussion with nursing staff, consultants, updating family and interview/physical exam; more  than 50% of that time was spent in counseling and/or coordination of care.    Joseph Art, DO [facility] Available via Epic secure chat 7am-7pm After these hours, please refer to coverage provider listed on [email or web address] 05/19/2023, 1:56 PM

## 2023-05-19 NOTE — TOC CAGE-AID Note (Signed)
Transition of Care [facility]) - CAGE-AID Screening   Patient Details  Name: Jeremy Hayden MRN: [id number] Date of Birth: 27-May-1956  Hewitt Shorts, RN Trauma Response Nurse Phone Number: [phone] 05/19/2023, 12:18 PM      CAGE-AID Screening:    Have You Ever Felt You Ought to Cut Down on Your Drinking or Drug Use?: No Have People Annoyed You By [employer] Your Drinking Or Drug Use?: No Have You Felt Bad Or Guilty About Your Drinking Or Drug Use?: No Have You Ever Had a Drink or Used Drugs First Thing In The Morning to Steady Your Nerves or to Get Rid of a Hangover?: No CAGE-AID Score: 0  Substance Abuse Education Offered: No

## 2023-05-19 NOTE — TOC Initial Note (Signed)
Transition of Care [facility]) - Initial/Assessment Note    Patient Details  Name: Jeremy Hayden MRN: [id number] Date of Birth: Jul 25, 1955  [facility]) CM/SW Contact:    Sherrie Marsan A [place], LCSWA Phone Number: 05/19/2023, 4:43 PM  Clinical Narrative:                  CSW met with pt at bedside. He stated that he was agreeable to workup for SNF. CSW sent out bed offers, offers pending.   TOC will continue to follow.  Expected Discharge Plan: Skilled Nursing Facility Barriers to Discharge: SNF Pending bed offer, Continued Medical Work up, English as a second language teacher   Patient Goals and CMS Choice            Expected Discharge Plan and Services In-house Referral: Clinical Social Work                                            Prior Living Arrangements/Services   Lives with:: Self                   Activities of Daily Living   ADL Screening (condition at time of admission) Independently performs ADLs?: Yes (appropriate for developmental age) Is the patient deaf or have difficulty hearing?: No Does the patient have difficulty seeing, even when wearing glasses/contacts?: No Does the patient have difficulty concentrating, remembering, or making decisions?: Yes  Permission Sought/Granted                  Emotional Assessment Appearance:: Appears stated age Attitude/Demeanor/Rapport: Guarded Affect (typically observed): Calm Orientation: : Oriented to Self, Oriented to Place, Oriented to  Time, Oriented to Situation Alcohol / Substance Use: Not Applicable Psych Involvement: No (comment)  Admission diagnosis:  Syncope and collapse [R55] Syncope [R55] Orthostatic hypotension [I95.1] Patient Active Problem List   Diagnosis Date Noted   Orthostatic hypotension 05/07/2023   Supine hypertension 05/07/2023   Syncope and collapse 05/07/2023   Syncope 04/05/2022   Hematemesis with nausea    Dehydration 03/26/2015   Chest pain 03/26/2015    Gastroenteritis 03/26/2015   Paroxysmal atrial fibrillation (HCC) 03/26/2015   Blood in stool    TINEA CAPITIS 07/01/2006   Hyperlipidemia 07/01/2006   Hypertension 07/01/2006   GERD 07/01/2006   DISC DISEASE, CERVICAL 07/01/2006   PCP:  [facility] Pharmacy:   [id number] - [place] - [address]  ([place]) [place] [address] Phone: [phone] Fax: [phone]  [facility] - [place], [place] - [address] [place] [address] Phone: [phone] Fax: [phone]  [facility] [address] [place], [place] - [address] [place] [address] Phone: [phone] Fax: [phone]     Social Determinants of Health (SDOH) Social History: SDOH Screenings   Food Insecurity: No Food Insecurity (05/17/2023)  Housing: Low Risk  (05/17/2023)  Transportation Needs: No Transportation Needs (05/17/2023)  Utilities: Not At Risk (05/17/2023)  Social Connections: Unknown (03/01/2022)   Received from [facility], [facility]  Tobacco Use: Low Risk  (05/15/2023)   SDOH Interventions:     Readmission Risk Interventions    04/08/2022   10:34 AM  Readmission Risk Prevention Plan  Post Dischage Appt Complete  Medication Screening Complete  Transportation Screening Complete

## 2023-05-20 DIAGNOSIS — R55 Syncope and collapse: Secondary | ICD-10-CM | POA: Diagnosis not present

## 2023-05-20 DIAGNOSIS — S2241XA Multiple fractures of ribs, right side, initial encounter for closed fracture: Secondary | ICD-10-CM

## 2023-05-20 DIAGNOSIS — S22078A Other fracture of T9-T10 vertebra, initial encounter for closed fracture: Secondary | ICD-10-CM | POA: Diagnosis not present

## 2023-05-20 LAB — CORTISOL-AM, BLOOD: Cortisol - AM: 10.3 ug/dL (ref 6.7–22.6)

## 2023-05-20 MED ORDER — SODIUM CHLORIDE 0.9 % IV SOLN: INTRAVENOUS | Status: DC

## 2023-05-20 MED ORDER — MIDODRINE HCL 10 MG PO TABS: 10.0000 mg | ORAL_TABLET | Freq: Three times a day (TID) | ORAL | 0 refills | Status: AC

## 2023-05-20 NOTE — Progress Notes (Signed)
Physical Therapy Treatment Patient Details Name: Jeremy Hayden MRN: [id number] DOB: [birth year] Today's Date: 05/20/2023   History of Present Illness Pt is a 67 y.o. male admitted 10/31 for syncope episode in bathroom hit right flank side. Chest CT: Posterolateral right 9th through 11th rib fractures. Subtle slightly depressed appearance of the superior endplates of  T10 and T12. Question subtle slight compression fractures. PMHx: CAD status post PCI and stent placement in 2013, prostate cancer, GERD, HTN.    PT Comments  Pt received sitting in the recliner and agreeable to session. Orthostatic vitals taken during session and are listed below. Pt reports no orthostatic symptoms throughout session. Pt able to tolerate 2 short gait trials in the room with CGA and a chair follow for safety. Pt noted to have delayed processing and impaired problem solving during gait trial. Pt demonstrates shuffled steps and is able to briefly improve with cues, however quickly returns to short steps. Pt continues to benefit from PT services to progress toward functional mobility goals.  Orthostatic BPs  Sitting 120/79  Standing 91/65  Standing after 3 min 90/71  Post first gait trial  132/90  Sitting at end of session 124/92      If plan is discharge home, recommend the following: Assist for transportation;A little help with walking and/or transfers;A little help with bathing/dressing/bathroom;Assistance with cooking/housework;Direct supervision/assist for medications management;Direct supervision/assist for financial management;Help with stairs or ramp for entrance;Supervision due to cognitive status   Can travel by private vehicle     Yes  Equipment Recommendations  None recommended by PT    Recommendations for Other Services       Precautions / Restrictions Precautions Precautions: Fall;Back Precaution Comments: watch BP. rib fxs Rt, compression fxs spine Restrictions Weight Bearing Restrictions: No      Mobility  Bed Mobility               General bed mobility comments: Pt beginning and ending session in recliner    Transfers Overall transfer level: Needs assistance Equipment used: Rolling walker (2 wheels) Transfers: Sit to/from Stand Sit to Stand: Contact guard assist           General transfer comment: CGA for safety, but no physical assist needed from recliner    Ambulation/Gait Ambulation/Gait assistance: Contact guard assist Gait Distance (Feet): 15 Feet (x2) Assistive device: Rolling walker (2 wheels) Gait Pattern/deviations: Step-through pattern, Decreased stride length, Shuffle, Trunk flexed Gait velocity: reduced   Pre-gait activities: marching General Gait Details: Pt demonstrating slow, short steps with progressively decreased step length with increased distance. Pt able to briefly increase step length with cues. Cues and increased time required for obstacle negotiation. Cues for upright posture and upward gaze.       Balance Overall balance assessment: Needs assistance Sitting-balance support: Feet supported, No upper extremity supported Sitting balance-Leahy Scale: Fair Sitting balance - Comments: sitting in recliner   Standing balance support: Bilateral upper extremity supported, Reliant on assistive device for balance, During functional activity Standing balance-Leahy Scale: Poor Standing balance comment: with RW support                            Cognition Arousal: Alert Behavior During Therapy: Flat affect Overall Cognitive Status: No family/caregiver present to determine baseline cognitive functioning  General Comments: Pt demonstrating delayed processing and problem solving requiring increased cues for obstacle negotiation        Exercises General Exercises - Lower Extremity Hip Flexion/Marching: AROM, Standing, Both, 15 reps    General Comments General comments (skin  integrity, edema, etc.): Orthostatics taken and entered into flowsheets. Pt with abdominal binder and compression stockings donned throughout session. Pt drops with standing, however pt reports no symptoms. BP after short gait trial in room 132/90 and at end of session 124/92      Pertinent Vitals/Pain Pain Assessment Pain Assessment: Faces Faces Pain Scale: No hurt     PT Goals (current goals can now be found in the care plan section) Acute Rehab PT Goals Patient Stated Goal: Get well before returning home PT Goal Formulation: With patient Time For Goal Achievement: 06/02/23 Progress towards PT goals: Progressing toward goals    Frequency    Min 1X/week       AM-PAC PT "6 Clicks" Mobility   Outcome Measure  Help needed turning from your back to your side while in a flat bed without using bedrails?: A Little Help needed moving from lying on your back to sitting on the side of a flat bed without using bedrails?: A Little Help needed moving to and from a bed to a chair (including a wheelchair)?: A Little Help needed standing up from a chair using your arms (e.g., wheelchair or bedside chair)?: A Little Help needed to walk in hospital room?: A Little Help needed climbing 3-5 steps with a railing? : A Lot 6 Click Score: 17    End of Session Equipment Utilized During Treatment: Gait belt;Other (comment) (compression stockings and abdominal binder) Activity Tolerance: Patient tolerated treatment well Patient left: in chair;with call bell/phone within reach;with chair alarm set Nurse Communication: Mobility status PT Visit Diagnosis: Unsteadiness on feet (R26.81);Other abnormalities of gait and mobility (R26.89);History of falling (Z91.81);Dizziness and giddiness (R42);Muscle weakness (generalized) (M62.81);Difficulty in walking, not elsewhere classified (R26.2)     Time: 1478-2956 PT Time Calculation (min) (ACUTE ONLY): 29 min  Charges:    $Gait Training: 8-22  mins $Therapeutic Activity: 8-22 mins PT General Charges $$ ACUTE PT VISIT: 1 Visit                     Johny Shock, PTA Acute Rehabilitation Services Secure Chat Preferred  [phone]    Johny Shock 05/20/2023, 11:24 AM

## 2023-05-20 NOTE — Discharge Summary (Signed)
Physician Discharge Summary  Jeremy Hayden EXB:[id number] DOB: [birth year] DOA: 05/15/2023  PCP: [facility]  Admit date: 05/15/2023 Discharge date: 05/20/2023  Admitted From: home Discharge disposition: home   Recommendations for Outpatient Follow-Up:   Refused SNF Patient requesting to be d/c'd early for a court date Suspect he will be non-compliant and fall again Home health Allow supine hypertension Wear TED hose and abdominal binder   Discharge Diagnosis:   Principal Problem:   Syncope Active Problems:   Hyperlipidemia   Hypertension   GERD   Orthostatic hypotension    Discharge Condition: Improved.  Diet recommendation:  Regular.  Wound care: None.  Code status: Full.      Hospital Course by Problem:   Recurrent syncope secondary to orthostatic hypotension/also history of supine hypertension:  -  Orthostatic positive in the ED.  He was discharged on midodrine 5 mg p.o. twice daily during recent hospitalization -not complaint with ted hose either -seen by cardiology last admission -added abdominal binder and recheck orthos-- improved some with binder-- increased medication on 11/3 and upon recheck STILL Orthostatic-- gave IVF, increase midodrine now to TID on 11/4 and will consideration to adding florinef-- cortisol in AM is negative so not sure that would help--- await orthostatics POST IVF -patient lives alone so concern for continued falls -asked PT to reval due to above concern or living alone-- SNF recommended but -refused SNF -patient requesting d/c   Right 9th through 11th rib fracture: Secondary to trauma that happened after syncope -lidocaine patch   GERD: PPI.   Hypokalemia -replete   Elevated LFTs/hyperbilirubinemia:  -trended down   Possible T10 and T12 fracture: CT chest abdomen also shows slightly depressed appearance of the superior endplates of T10, N02, question subtle slight compression fracture.  Patient is  not complaining of any back pain.             Medical Consultants:      Discharge Exam:   Vitals:   05/20/23 1255 05/20/23 1632  BP: (!) 134/94 (!) 155/98  Pulse: 93 (!) 101  Resp:    Temp: 97.8 F (36.6 C) 98.3 F (36.8 C)  SpO2:  100%   Vitals:   05/20/23 0400 05/20/23 0743 05/20/23 1255 05/20/23 1632  BP: (!) 159/94 (!) 164/99 (!) 134/94 (!) 155/98  Pulse: 69 80 93 (!) 101  Resp: 18     Temp: 98.7 F (37.1 C) 97.9 F (36.6 C) 97.8 F (36.6 C) 98.3 F (36.8 C)  TempSrc:  Oral Oral Oral  SpO2: 98% 100%  100%  Weight: 52.9 kg     Height:        General exam: Appears calm and comfortable.   The results of significant diagnostics from this hospitalization (including imaging, microbiology, ancillary and laboratory) are listed below for reference.     Procedures and Diagnostic Studies:   ECHOCARDIOGRAM COMPLETE  Result Date: 05/16/2023    ECHOCARDIOGRAM REPORT   Patient Name:   Jeremy Hayden Date of Exam: 05/16/2023 Medical Rec #:  [id number]       Height:       66.0 in Accession #:    [id number]      Weight:       123.0 lb Date of Birth:  1955-09-10        BSA:          1.627 m Patient Age:    67 years        BP:  176/95 mmHg Patient Gender: M               HR:           88 bpm. Exam Location:  Inpatient Procedure: 2D Echo, Cardiac Doppler and Color Doppler Indications:    Syncope R55  History:        Patient has prior history of Echocardiogram examinations, most                 recent 04/06/2022. Arrythmias:Atrial Fibrillation,                 Signs/Symptoms:Chest Pain and Syncope; Risk Factors:Hypertension                 and Dyslipidemia.  Sonographer:    Lucendia Herrlich RCS Referring Phys: [id number] RAVI PAHWANI IMPRESSIONS  1. Left ventricular ejection fraction, by estimation, is >75%. The left ventricle has hyperdynamic function. The left ventricle has no regional wall motion abnormalities. There is mild left ventricular hypertrophy. Left ventricular  diastolic parameters were normal.  2. Right ventricular systolic function is normal. The right ventricular size is normal.  3. The mitral valve is normal in structure. Trivial mitral valve regurgitation.  4. The aortic valve is tricuspid. Aortic valve regurgitation is not visualized.  5. The inferior vena cava is normal in size with greater than 50% respiratory variability, suggesting right atrial pressure of 3 mmHg. Comparison(s): The left ventricular function is unchanged. FINDINGS  Left Ventricle: Left ventricular ejection fraction, by estimation, is >75%. The left ventricle has hyperdynamic function. The left ventricle has no regional wall motion abnormalities. The left ventricular internal cavity size was small. There is mild left ventricular hypertrophy. Left ventricular diastolic parameters were normal. Right Ventricle: The right ventricular size is normal. Right vetricular wall thickness was not assessed. Right ventricular systolic function is normal. Left Atrium: Left atrial size was normal in size. Right Atrium: Right atrial size was normal in size. Pericardium: There is no evidence of pericardial effusion. Mitral Valve: The mitral valve is normal in structure. Trivial mitral valve regurgitation. Tricuspid Valve: The tricuspid valve is normal in structure. Tricuspid valve regurgitation is mild. Aortic Valve: The aortic valve is tricuspid. Aortic valve regurgitation is not visualized. Aortic valve peak gradient measures 5.5 mmHg. Pulmonic Valve: The pulmonic valve was normal in structure. Pulmonic valve regurgitation is not visualized. Aorta: The aortic root and ascending aorta are structurally normal, with no evidence of dilitation. Venous: The inferior vena cava is normal in size with greater than 50% respiratory variability, suggesting right atrial pressure of 3 mmHg. IAS/Shunts: No atrial level shunt detected by color flow Doppler.  LEFT VENTRICLE PLAX 2D LVIDd:         3.30 cm   Diastology LVIDs:          2.00 cm   LV e' medial:    6.84 cm/s LV PW:         1.10 cm   LV E/e' medial:  8.0 LV IVS:        1.20 cm   LV e' lateral:   13.20 cm/s LVOT diam:     2.10 cm   LV E/e' lateral: 4.1 LV SV:         45 LV SV Index:   28 LVOT Area:     3.46 cm  RIGHT VENTRICLE             IVC RV S prime:     12.10 cm/s  IVC diam: 1.50  cm TAPSE (M-mode): 1.2 cm LEFT ATRIUM             Index        RIGHT ATRIUM          Index LA diam:        3.00 cm 1.84 cm/m   RA Area:     8.40 cm LA Vol (A2C):   27.2 ml 16.72 ml/m  RA Volume:   12.30 ml 7.56 ml/m LA Vol (A4C):   23.4 ml 14.39 ml/m LA Biplane Vol: 26.2 ml 16.11 ml/m  AORTIC VALVE AV Area (Vmax): 2.49 cm AV Vmax:        117.00 cm/s AV Peak Grad:   5.5 mmHg LVOT Vmax:      84.08 cm/s LVOT Vmean:     52.675 cm/s LVOT VTI:       0.131 m  AORTA Ao Root diam: 3.60 cm Ao Asc diam:  3.40 cm MITRAL VALVE               TRICUSPID VALVE MV Area (PHT): 3.60 cm    TR Peak grad:   19.5 mmHg MV Decel Time: 211 msec    TR Vmax:        221.00 cm/s MV E velocity: 54.70 cm/s MV A velocity: 76.00 cm/s  SHUNTS MV E/A ratio:  0.72        Systemic VTI:  0.13 m                            Systemic Diam: 2.10 cm Dietrich Pates MD Electronically signed by Dietrich Pates MD Signature Date/Time: 05/16/2023/4:30:49 PM    Final    CT CHEST ABDOMEN PELVIS W CONTRAST  Result Date: 05/15/2023 CLINICAL DATA:  Polytrauma, blunt right sided pain, more to lower abdomen and hip. Loss of consciousness. EXAM: CT CHEST, ABDOMEN, AND PELVIS WITH CONTRAST TECHNIQUE: Multidetector CT imaging of the chest, abdomen and pelvis was performed following the standard protocol during bolus administration of intravenous contrast. RADIATION DOSE REDUCTION: This exam was performed according to the departmental dose-optimization program which includes automated exposure control, adjustment of the mA and/or kV according to patient size and/or use of iterative reconstruction technique. CONTRAST:  75mL OMNIPAQUE IOHEXOL 350 MG/ML SOLN  COMPARISON:  01/18/2019 FINDINGS: CT CHEST FINDINGS Cardiovascular: Heart is normal size. Aorta is normal caliber. Moderate coronary artery and aortic atherosclerosis. Mediastinum/Nodes: No mediastinal, hilar, or axillary adenopathy. Trachea and esophagus are unremarkable. Thyroid unremarkable. Lungs/Pleura: Linear bibasilar atelectasis. No effusions or pneumothorax. Musculoskeletal: Right posterolateral 9th through 11th rib fractures. No left rib fractures. Slight subtle depressed appearance in the superior endplates of T10 and T12, cannot exclude very subtle slight compression fractures. Chest wall soft tissues are unremarkable. CT ABDOMEN PELVIS FINDINGS Hepatobiliary: Diffuse low-density throughout the liver compatible with fatty infiltration. No focal abnormality. Gallbladder unremarkable. No perihepatic hematoma. Pancreas: No focal abnormality or ductal dilatation. Spleen: No splenic injury or perisplenic hematoma. Adrenals/Urinary Tract: No adrenal hemorrhage or renal injury identified. Bladder is unremarkable. Stomach/Bowel: Normal appendix. Diffuse colonic diverticulosis. No active diverticulitis. Stomach and small bowel decompressed, unremarkable. Vascular/Lymphatic: Aortic atherosclerosis. No evidence of aneurysm or adenopathy. Reproductive: No visible focal abnormality. Other: No free fluid or free air. Musculoskeletal: No acute bony abnormality. IMPRESSION: Posterolateral right 9th through 11th rib fractures. No effusions or pneumothorax. Bibasilar atelectasis. Subtle slightly depressed appearance of the superior endplates of T10 and T12. Question subtle slight compression fractures. Recommend correlation for pain in  these areas. Coronary artery disease, aortic atherosclerosis. Hepatic steatosis. Colonic diverticulosis. Electronically Signed   By: Charlett Nose M.D.   On: 05/15/2023 15:21   CT Head Wo Contrast  Result Date: 05/15/2023 CLINICAL DATA:  Neck trauma (Age >= 65y); Head trauma, minor (Age  >= 65y) EXAM: CT HEAD WITHOUT CONTRAST CT CERVICAL SPINE WITHOUT CONTRAST TECHNIQUE: Multidetector CT imaging of the head and cervical spine was performed following the standard protocol without intravenous contrast. Multiplanar CT image reconstructions of the cervical spine were also generated. RADIATION DOSE REDUCTION: This exam was performed according to the departmental dose-optimization program which includes automated exposure control, adjustment of the mA and/or kV according to patient size and/or use of iterative reconstruction technique. COMPARISON:  CT Head and C Spine 01/24/23 FINDINGS: CT HEAD FINDINGS Brain: No hemorrhage. No hydrocephalus. No extra-axial fluid collection. No CT evidence of an acute cortical infarct. No mass effect. No mass lesion. Mineralization of the basal ganglia bilaterally. Sequela of mild chronic microvascular ischemic change. Vascular: No hyperdense vessel or unexpected calcification. Mildly ectatic appearance of the basilar artery, unchanged from prior Skull: Mild soft swelling over the posterior scalp. No evidence of an underlying calvarial fracture. Sinuses/Orbits: No middle ear or mastoid effusion. Paranasal sinuses are clear. Orbits are unremarkable. Other: None. CT CERVICAL SPINE FINDINGS Alignment: Normal. Skull base and vertebrae: Status post C4-C6 ACDF Soft tissues and spinal canal: No prevertebral fluid or swelling. No visible canal hematoma. Disc levels:  No evidence of high-grade spinal canal stenosis Upper chest: Negative. Other: None IMPRESSION: 1. No CT evidence of intracranial injury. 2. Mild soft tissue swelling over the posterior scalp. No evidence of an underlying calvarial fracture. 3. No acute fracture or traumatic subluxation of the cervical spine. Electronically Signed   By: Lorenza Cambridge M.D.   On: 05/15/2023 14:28   CT Cervical Spine Wo Contrast  Result Date: 05/15/2023 CLINICAL DATA:  Neck trauma (Age >= 65y); Head trauma, minor (Age >= 65y) EXAM:  CT HEAD WITHOUT CONTRAST CT CERVICAL SPINE WITHOUT CONTRAST TECHNIQUE: Multidetector CT imaging of the head and cervical spine was performed following the standard protocol without intravenous contrast. Multiplanar CT image reconstructions of the cervical spine were also generated. RADIATION DOSE REDUCTION: This exam was performed according to the departmental dose-optimization program which includes automated exposure control, adjustment of the mA and/or kV according to patient size and/or use of iterative reconstruction technique. COMPARISON:  CT Head and C Spine 01/24/23 FINDINGS: CT HEAD FINDINGS Brain: No hemorrhage. No hydrocephalus. No extra-axial fluid collection. No CT evidence of an acute cortical infarct. No mass effect. No mass lesion. Mineralization of the basal ganglia bilaterally. Sequela of mild chronic microvascular ischemic change. Vascular: No hyperdense vessel or unexpected calcification. Mildly ectatic appearance of the basilar artery, unchanged from prior Skull: Mild soft swelling over the posterior scalp. No evidence of an underlying calvarial fracture. Sinuses/Orbits: No middle ear or mastoid effusion. Paranasal sinuses are clear. Orbits are unremarkable. Other: None. CT CERVICAL SPINE FINDINGS Alignment: Normal. Skull base and vertebrae: Status post C4-C6 ACDF Soft tissues and spinal canal: No prevertebral fluid or swelling. No visible canal hematoma. Disc levels:  No evidence of high-grade spinal canal stenosis Upper chest: Negative. Other: None IMPRESSION: 1. No CT evidence of intracranial injury. 2. Mild soft tissue swelling over the posterior scalp. No evidence of an underlying calvarial fracture. 3. No acute fracture or traumatic subluxation of the cervical spine. Electronically Signed   By: Lorenza Cambridge M.D.   On: 05/15/2023 14:28  DG Pelvis Portable  Result Date: 05/15/2023 CLINICAL DATA:  Pain after fall EXAM: PORTABLE PELVIS 1 VIEWS COMPARISON:  None Available. FINDINGS: No  fracture or dislocation. Preserved joint spaces. There is some sclerosis of the right sacroiliac joint. Scattered vascular calcifications. IMPRESSION: Osteopenia. Degenerative changes of the right sacroiliac joint right-greater-than-left. Electronically Signed   By: Karen Kays M.D.   On: 05/15/2023 14:12   DG Chest Portable 1 View  Result Date: 05/15/2023 CLINICAL DATA:  Pain after fall. EXAM: PORTABLE CHEST 1 VIEW COMPARISON:  X-ray 05/07/2023 and older. FINDINGS: Underinflation. No pneumothorax, effusion or edema. Normal cardiopericardial silhouette. Overlapping cardiac leads. IMPRESSION: Underinflation.  No acute cardiopulmonary disease. Electronically Signed   By: Karen Kays M.D.   On: 05/15/2023 14:11     Labs:   Basic Metabolic Panel: Recent Labs  Lab 05/15/23 1152 05/15/23 1201 05/15/23 1750 05/16/23 0600 05/17/23 0600 05/18/23 0503  NA 134* 133*  --  134* 135 138  K 4.1 4.6  --  3.2* 3.4* 3.7  CL 98 97*  --  102 104 105  CO2 21*  --   --  24 24 24   GLUCOSE 88 88  --  76 128* 96  BUN 29* 40*  --  18 11 10   CREATININE 1.24 1.20 1.15 1.04 0.91 0.97  CALCIUM 9.8  --   --  8.8* 9.4 9.4  MG 1.7  --   --   --   --   --    GFR Estimated Creatinine Clearance: 54.5 mL/min (by C-G formula based on SCr of 0.97 mg/dL). Liver Function Tests: Recent Labs  Lab 05/15/23 1152 05/16/23 0600  AST 67* 47*  ALT 52* 40  ALKPHOS 116 97  BILITOT 3.4* 2.7*  PROT 7.2 6.2*  ALBUMIN 2.9* 2.5*   No results for input(s): "LIPASE", "AMYLASE" in the last 168 hours. Recent Labs  Lab 05/15/23 1152  AMMONIA 32   Coagulation profile No results for input(s): "INR", "PROTIME" in the last 168 hours.  CBC: Recent Labs  Lab 05/15/23 1201 05/15/23 1750 05/17/23 0600  WBC  --  3.9* 3.4*  HGB 13.3 12.9* 12.3*  HCT 39.0 36.8* 34.3*  MCV  --  103.1* 102.1*  PLT  --  292 269   Cardiac Enzymes: No results for input(s): "CKTOTAL", "CKMB", "CKMBINDEX", "TROPONINI" in the last 168  hours. BNP: Invalid input(s): "POCBNP" CBG: Recent Labs  Lab 05/15/23 1050  GLUCAP 92   D-Dimer No results for input(s): "DDIMER" in the last 72 hours. Hgb A1c No results for input(s): "HGBA1C" in the last 72 hours. Lipid Profile No results for input(s): "CHOL", "HDL", "LDLCALC", "TRIG", "CHOLHDL", "LDLDIRECT" in the last 72 hours. Thyroid function studies No results for input(s): "TSH", "T4TOTAL", "T3FREE", "THYROIDAB" in the last 72 hours.  Invalid input(s): "FREET3" Anemia work up No results for input(s): "VITAMINB12", "FOLATE", "FERRITIN", "TIBC", "IRON", "RETICCTPCT" in the last 72 hours. Microbiology No results found for this or any previous visit (from the past 240 hour(s)).   Discharge Instructions:   Discharge Instructions     Diet - low sodium heart healthy   Complete by: As directed    Increase activity slowly   Complete by: As directed       Allergies as of 05/20/2023   No Known Allergies      Medication List     STOP taking these medications    losartan 25 MG tablet Commonly known as: COZAAR       TAKE these medications  Cholecalciferol 125 MCG (5000 UT) capsule Take 5,000 Units by mouth daily.   fluticasone 50 MCG/ACT nasal spray Commonly known as: FLONASE Place 2 sprays into both nostrils daily as needed for allergies or rhinitis.   hydrocortisone 2.5 % rectal cream Commonly known as: ANUSOL-HC Place 1 application rectally 2 (two) times daily. What changed:  when to take this reasons to take this   midodrine 10 MG tablet Commonly known as: PROAMATINE Take 1 tablet (10 mg total) by mouth 3 (three) times daily with meals. Start taking on: May 21, 2023 What changed:  medication strength See the new instructions. Another medication with the same name was removed. Continue taking this medication, and follow the directions you see here.   multivitamin with minerals tablet Take 1 tablet by mouth daily with breakfast.    omeprazole 20 MG capsule Commonly known as: PRILOSEC Take 20 mg by mouth daily before breakfast.   sodium chloride 0.65 % Soln nasal spray Commonly known as: OCEAN Place 1 spray into both nostrils as needed for congestion.   thiamine 100 MG tablet Commonly known as: Vitamin B-1 Take 100 mg by mouth daily.   TYLENOL 500 MG tablet Generic drug: acetaminophen Take 500-1,000 mg by mouth every 6 (six) hours as needed for headache or mild pain.        Follow-up Information     [facility] Follow up.   Why: [facility] will provide home health services.  They will call you in the next 24-48 hours to set up services. Contact information: [address] [place] [address] [phone]                  Time coordinating discharge: 45 min  Signed:  Joseph Art DO  [facility] 05/20/2023, 6:12 PM

## 2023-05-20 NOTE — Discharge Instructions (Signed)
Be sure to wear TED hose and abdominal binder when up Sleep at  20deg angle

## 2023-05-20 NOTE — TOC Progression Note (Signed)
Transition of Care [facility]) - Progression Note    Patient Details  Name: Jeremy Hayden MRN: [id number] Date of Birth: January 26, 1956  Transition of Care [facility]) CM/SW Contact  [name], [place] Phone Number: 05/20/2023, 1:48 PM  Clinical Narrative:     CSW met with pt at bedside to provide bed offers. He stated they would discuss and make a decision on a facility and let CSW know. CSW contact information provided.   TOC will continue to follow.     Expected Discharge Plan: Skilled Nursing Facility Barriers to Discharge: SNF Pending bed offer, Continued Medical Work up, English as a second language teacher  Expected Discharge Plan and Services In-house Referral: Clinical Social Work                                             Social Determinants of Health (SDOH) Interventions SDOH Screenings   Food Insecurity: No Food Insecurity (05/17/2023)  Housing: Low Risk  (05/17/2023)  Transportation Needs: No Transportation Needs (05/17/2023)  Utilities: Not At Risk (05/17/2023)  Social Connections: Unknown (03/01/2022)   Received from [facility]  Tobacco Use: Low Risk  (05/15/2023)    Readmission Risk Interventions    04/08/2022   10:34 AM  Readmission Risk Prevention Plan  Post Dischage Appt Complete  Medication Screening Complete  Transportation Screening Complete

## 2023-05-20 NOTE — Progress Notes (Signed)
Mobility Specialist Progress Note:    05/20/23 1045  Mobility  Activity Ambulated with assistance in room  Level of Assistance Modified independent, requires aide device or extra time  Assistive Device None  Distance Ambulated (ft) 25 ft  Activity Response Tolerated well  Mobility Referral Yes  $Mobility charge 1 Mobility  Mobility Specialist Start Time (ACUTE ONLY) 1045  Mobility Specialist Stop Time (ACUTE ONLY) 1100  Mobility Specialist Time Calculation (min) (ACUTE ONLY) 15 min   Pt received in chair, agreeable to mobility session. Performed orthostatics and ambulated in room with ModI and CGA for safety. Tolerated well, asx throughout. Recorded BP below. Returned pt to chair, alarm on, all needs met, call bell in reach.   Sitting in Chair: 124/89 (98) Standing 1 min: 94/79 (85) Standing 3 min: 92/67 (75) Post Ambulation: 74/56 (62) End of Session (Sitting in Chair):138/97 (110)  Feliciana Rossetti Mobility Specialist Please contact via SecureChat or  Rehab office at [phone]

## 2023-05-20 NOTE — Progress Notes (Signed)
Transition of Care [facility]) - Inpatient Brief Assessment   Patient Details  Name: Jeremy Hayden MRN: [id number] Date of Birth: 10-11-1955  Transition of Care [facility]) CM/SW Contact:    Janae Bridgeman, RN Phone Number: 05/20/2023, 4:03 PM   Clinical Narrative: CM met with the patient at the bedside and the patient declined admission to SNF facility for rehabilitation and requests to go home with home health services.  Patient was offered Medicare choice for home health and patient did not have a preference.  [facility] and requested home health services for RN, PT, OT and aide and [facility] accepted.  Home health orders will be placed and [facility] plans to start insurance authorization through the [place].  Patient states that his son will provide transportation to home tomorrow once patient is stable for discharge.  Patient has binder around chest for recent rib fractures and I asked bedside nursing to provide an incentive spirometer at the bedside.  No other TOC needs at this time.  I left a detailed voice message with the patient's son, Jeremy Hayden and updated him regarding [facility] and plans for discharge tomorrow.   Transition of Care Asessment: Insurance and Status: (P) Insurance coverage has been reviewed Patient has primary care physician: (P) Yes Home environment has been reviewed: (P) From home alone Prior level of function:: (P) Independent Prior/Current Home Services: (P) No current home services Social Determinants of Health Reivew: (P) SDOH reviewed interventions complete Readmission risk has been reviewed: (P) Yes Transition of care needs: (P) transition of care needs identified, TOC will continue to follow

## 2023-05-20 NOTE — [place] FL2 (Signed)
[place] MEDICAID FL2 LEVEL OF CARE FORM     IDENTIFICATION  Patient Name: Jeremy Hayden Birthdate: [birth year] Sex: male Admission Date (Current Location): 05/15/2023  [facility] and [place] Number:  Producer, television/film/video and Address:  [facility] [place], 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   Syncope and collapse 05/07/2023   Syncope 04/05/2022   Hematemesis with nausea    Dehydration 03/26/2015   Chest pain 03/26/2015   Gastroenteritis 03/26/2015   Paroxysmal atrial fibrillation (HCC) 03/26/2015   Blood in stool    TINEA CAPITIS 07/01/2006   Hyperlipidemia 07/01/2006   Hypertension 07/01/2006   GERD 07/01/2006   DISC DISEASE, CERVICAL 07/01/2006    Orientation RESPIRATION BLADDER Height & Weight     Self, Time, Place, Situation  Normal Continent Weight: 116 lb 10 oz (52.9 kg) Height:  5\' 6"  (167.6 cm)  BEHAVIORAL SYMPTOMS/MOOD NEUROLOGICAL BOWEL NUTRITION STATUS      Continent Diet (see discharge summary)  AMBULATORY STATUS COMMUNICATION OF NEEDS Skin   Limited Assist Verbally Normal                       Personal Care Assistance Level of Assistance  Bathing, Feeding, Dressing Bathing Assistance: Limited assistance Feeding assistance: Independent Dressing Assistance: Limited assistance     Functional Limitations Info  Sight, Hearing, Speech Sight Info: Impaired Hearing Info: Adequate Speech Info: Adequate    SPECIAL CARE FACTORS  FREQUENCY  PT (By licensed PT)     PT Frequency: 5x/week              Contractures Contractures Info: Not present    Additional Factors Info  Code Status, Allergies Code Status Info: FULL Allergies Info: No Known Allergies           Current Medications (05/20/2023):  This is the current hospital active medication list Current Facility-Administered Medications  Medication Dose Route Frequency Provider Last Rate Last Admin   acetaminophen (TYLENOL) tablet 650 mg  650 mg Oral Q6H PRN Hughie Closs, MD   650 mg at 05/19/23 0841   Or   acetaminophen (TYLENOL) suppository 650 mg  650 mg Rectal Q6H PRN Hughie Closs, MD       enoxaparin (LOVENOX) injection 40 mg  40 mg Subcutaneous Q24H Pahwani, Ravi, MD   40 mg at 05/19/23 1622   fluticasone (FLONASE) 50 MCG/ACT nasal spray 2 spray  2 spray Each Nare Daily PRN Hughie Closs, MD       hydrALAZINE (APRESOLINE) injection 10 mg  10 mg Intravenous Q6H PRN Pahwani, Daleen Bo, MD   10 mg at 05/17/23 0525   lidocaine (LIDODERM) 5 % 1 patch  1 patch Transdermal Daily Marlin Canary U, DO   1 patch at 05/19/23 0841   midodrine (PROAMATINE) tablet 10 mg  10 mg Oral TID WC Vann, Jessica U, DO   10 mg at 05/20/23 0559   ondansetron (ZOFRAN) tablet 4 mg  4 mg Oral Q6H PRN  Hughie Closs, MD       Or   ondansetron [facility]) injection 4 mg  4 mg Intravenous Q6H PRN Hughie Closs, MD       oxyCODONE (Oxy IR/ROXICODONE) immediate release tablet 5 mg  5 mg Oral Q6H PRN Hughie Closs, MD   5 mg at 05/19/23 1846   pantoprazole (PROTONIX) EC tablet 40 mg  40 mg Oral Daily Pahwani, Daleen Bo, MD   40 mg at 05/19/23 0841   sodium chloride flush (NS) 0.9 % injection 3 mL  3 mL Intravenous Q12H Hughie Closs, MD   3 mL at 05/20/23 0101   thiamine (VITAMIN B1) tablet 100 mg  100 mg Oral Daily Hughie Closs, MD   100 mg at 05/19/23 1610     Discharge Medications: Please see discharge summary for a list of discharge medications.  Relevant Imaging Results:  Relevant  Lab Results:   Additional Information RUE:[id number]  Clariza Sickman A [place], [place]

## 2023-05-20 NOTE — Plan of Care (Signed)

## 2023-05-20 NOTE — Progress Notes (Addendum)
PROGRESS NOTE    Jeremy Hayden  ZOX:[id number] DOB: 1956-02-01 DOA: 05/15/2023 PCP: [facility]    Brief Narrative:  Jeremy Hayden is a 67 y.o. male with medical history significant of  CAD status post PCI and stent placement in 2013, prostate cancer, GERD, hypertension and recurrent syncope due to orthostatic hypotension who was discharged from the hospital on 10/25 after a stay for chest pain and syncopal episode.  He presented to ED today yet with another episode of passing out that happened today while he was in the bathroom.  Per patient, he did not have any prodromal symptoms such as chest pain, shortness of breath, palpitation, dizziness, blurry vision or any other complaint.  When he fell, he hit his right side of the upper abdomen/lower chest to the edge of the bathtub.  Per him, he thinks he was out for 10 to 15 minutes.  When he woke up, he called for help.   Assessment and Plan: Recurrent syncope secondary to orthostatic hypotension/also history of supine hypertension:  -  Orthostatic positive in the ED.  He was discharged on midodrine 5 mg p.o. twice daily during recent hospitalization -not complaint with ted hose either -seen by cardiology last admission -added abdominal binder and recheck orthos-- improved some with binder-- increased medication on 11/3 and upon recheck STILL Orthostatic-- gave IVF, increase midodrine now to TID on 11/4 and will consideration to adding florinef-- cortisol in AM is negative so not sure that would help--- await orthostatics POST IVF -patient lives alone so concern for continued falls -asked PT to reval due to above concern or living alone-- SNF recommended   Addendum -refused SNF -plan for d/c home in AM -will give IVF overnight again  Right 9th through 11th rib fracture: Secondary to trauma that happened after syncope -lidocaine patch   GERD: PPI.   Hypokalemia -replete  Elevated LFTs/hyperbilirubinemia:  -trended  down  Possible T10 and T12 fracture: CT chest abdomen also shows slightly depressed appearance of the superior endplates of T10, W11, question subtle slight compression fracture.  Patient is not complaining of any back pain.      DVT prophylaxis: Place TED hose Start: 05/16/23 1400 enoxaparin (LOVENOX) injection 40 mg Start: 05/15/23 1800    Code Status: Full Code   Disposition Plan:  Level of care: Telemetry Medical Status [id number]:[id number] -SNF when bed available or home if progresses to safety     Consultants:  none   Subjective: In chair today, still c/o dizziness with standing  Objective: Vitals:   05/19/23 1613 05/19/23 2032 05/20/23 0400 05/20/23 0743  BP: 99/67 (!) 141/94 (!) 159/94 (!) 164/99  Pulse: 98 72 69 80  Resp: 18 17 18    Temp: (!) 97.4 F (36.3 C) 98.2 F (36.8 C) 98.7 F (37.1 C) 97.9 F (36.6 C)  TempSrc: Oral Oral  Oral  SpO2: 99% 99% 98% 100%  Weight:   52.9 kg   Height:        Intake/Output Summary (Last 24 hours) at 05/20/2023 1057 Last data filed at 05/20/2023 0706 Gross per 24 hour  Intake --  Output 120 ml  Net -120 ml   Filed Weights   05/18/23 0500 05/19/23 0500 05/20/23 0400  Weight: 52.5 kg 51.3 kg 52.9 kg    Examination:    General: Appearance:    Thin male in no acute distress     Lungs:     respirations unlabored  Heart:    Normal heart rate.  MS:   All extremities are intact.   Neurologic:   Awake, alert       Data Reviewed: I have personally reviewed following labs and imaging studies  CBC: Recent Labs  Lab 05/15/23 1201 05/15/23 1750 05/17/23 0600  WBC  --  3.9* 3.4*  HGB 13.3 12.9* 12.3*  HCT 39.0 36.8* 34.3*  MCV  --  103.1* 102.1*  PLT  --  292 269   Basic Metabolic Panel: Recent Labs  Lab 05/15/23 1152 05/15/23 1201 05/15/23 1750 05/16/23 0600 05/17/23 0600 05/18/23 0503  NA 134* 133*  --  134* 135 138  K 4.1 4.6  --  3.2* 3.4* 3.7  CL 98 97*  --  102 104 105  CO2 21*  --   --  24 24 24    GLUCOSE 88 88  --  76 128* 96  BUN 29* 40*  --  18 11 10   CREATININE 1.24 1.20 1.15 1.04 0.91 0.97  CALCIUM 9.8  --   --  8.8* 9.4 9.4  MG 1.7  --   --   --   --   --    GFR: Estimated Creatinine Clearance: 54.5 mL/min (by C-G formula based on SCr of 0.97 mg/dL). Liver Function Tests: Recent Labs  Lab 05/15/23 1152 05/16/23 0600  AST 67* 47*  ALT 52* 40  ALKPHOS 116 97  BILITOT 3.4* 2.7*  PROT 7.2 6.2*  ALBUMIN 2.9* 2.5*   No results for input(s): "LIPASE", "AMYLASE" in the last 168 hours. Recent Labs  Lab 05/15/23 1152  AMMONIA 32   Coagulation Profile: No results for input(s): "INR", "PROTIME" in the last 168 hours. Cardiac Enzymes: No results for input(s): "CKTOTAL", "CKMB", "CKMBINDEX", "TROPONINI" in the last 168 hours. BNP (last 3 results) No results for input(s): "PROBNP" in the last 8760 hours. HbA1C: No results for input(s): "HGBA1C" in the last 72 hours. CBG: Recent Labs  Lab 05/15/23 1050  GLUCAP 92   Lipid Profile: No results for input(s): "CHOL", "HDL", "LDLCALC", "TRIG", "CHOLHDL", "LDLDIRECT" in the last 72 hours. Thyroid Function Tests: No results for input(s): "TSH", "T4TOTAL", "FREET4", "T3FREE", "THYROIDAB" in the last 72 hours. Anemia Panel: No results for input(s): "VITAMINB12", "FOLATE", "FERRITIN", "TIBC", "IRON", "RETICCTPCT" in the last 72 hours. Sepsis Labs: No results for input(s): "PROCALCITON", "LATICACIDVEN" in the last 168 hours.  No results found for this or any previous visit (from the past 240 hour(s)).       Radiology Studies: No results found.      Scheduled Meds:  enoxaparin (LOVENOX) injection  40 mg Subcutaneous Q24H   lidocaine  1 patch Transdermal Daily   midodrine  10 mg Oral TID WC   pantoprazole  40 mg Oral Daily   sodium chloride flush  3 mL Intravenous Q12H   thiamine  100 mg Oral Daily   Continuous Infusions:     LOS: 1 day    Time spent: 45 minutes spent on chart review, discussion with  nursing staff, consultants, updating family and interview/physical exam; more than 50% of that time was spent in counseling and/or coordination of care.    Joseph Art, DO [facility] Available via Epic secure chat 7am-7pm After these hours, please refer to coverage provider listed on [email or web address] 05/20/2023, 10:57 AM

## 2023-05-27 NOTE — Progress Notes (Deleted)
Cardiology Clinic Note   Patient Name: Jeremy Hayden Date of Encounter: 05/27/2023  Primary Care Provider:  [facility] Primary Cardiologist:  None  Patient Profile    Jeremy Hayden 67 year old male presents to the clinic today for follow-up evaluation of his paroxysmal atrial fibrillation, orthostatic hypotension, and hyperlipidemia.  Past Medical History    Past Medical History:  Diagnosis Date   Cancer [facility])    prostate   GERD (gastroesophageal reflux disease)    Hypertension    Past Surgical History:  Procedure Laterality Date   ESOPHAGOGASTRODUODENOSCOPY N/A 03/27/2015   Procedure: ESOPHAGOGASTRODUODENOSCOPY (EGD);  Surgeon: Ruffin Frederick, MD;  Location: [facility] ENDOSCOPY;  Service: Gastroenterology;  Laterality: N/A;   NECK SURGERY  11/2001   multilevel c spine diskectomy, decompression, arthrodesis with bone graft and plate placed.  Dr Sharolyn Douglas   PROSTATE SURGERY     ROTATOR CUFF REPAIR Right 2003   with partial acromiectomy/plasty    Allergies  No Known Allergies  History of Present Illness    Jeremy Hayden has a PMH of syncope, hyperlipidemia, HTN, GERD, and orthostatic hypotension.  He presented to the hospital on 05/15/2023 and was discharged on 05/20/2023.  He reported a recurrent syncopal episode.  This was felt to be related to orthostatic hypotension and history of supine hypotension.  He was noted to be orthostatic positive in the emergency department.  He was discharged on midodrine 5 mg p.o. twice daily during a prior hospitalization.  He had been noncompliant with his TED hose.  Abdominal binder was recommended.  His symptoms improved.  On recheck of his orthostatic vital signs on 05/18/2023.  He continued to be orthostatic.  He received IV fluids and was placed on midodrine 3 times daily.  He reported that he left alone.  There was concern for continued falls.  He deferred SNF.  Physical therapy was asked to evaluate and recommended  skilled nursing.  Florinef was considered but not prescribed.  He presents to the clinic today for follow-up evaluation and states***.  *** denies chest pain, shortness of breath, lower extremity edema, fatigue, palpitations, melena, hematuria, hemoptysis, diaphoresis, weakness, presyncope, syncope, orthopnea, and PND.  Orthostatic hypotension, syncope-BP today***.  Noted to have syncope in the setting of hypotension.  He denies further episodes of syncope. Continue midodrine Increase p.o. hydration Continue lower extremity support stockings, abdominal binder Liberalize sodium intake  Paroxysmal atrial fibrillation-heart rate today***.  Denies episodes of accelerated or irregular heartbeat.  Not a candidate for anticoagulation due to recurrent falls. Avoid triggers caffeine, chocolate, EtOH, dehydration etc.  GERD-GERD diet instructions reviewed. Continue omeprazole GERD diet instructions given Elevate head of bed  Disposition: Follow-up with Dr. Jacinto Halim or me in 2-3 months.  Home Medications    Prior to Admission medications   Medication Sig Start Date End Date Taking? Authorizing Provider  Cholecalciferol 125 MCG (5000 UT) capsule Take 5,000 Units by mouth daily. 03/20/23   [provider]  fluticasone (FLONASE) 50 MCG/ACT nasal spray Place 2 sprays into both nostrils daily as needed for allergies or rhinitis.    [provider]  hydrocortisone (ANUSOL-HC) 2.5 % rectal cream Place 1 application rectally 2 (two) times daily. Patient taking differently: Place 1 application  rectally 2 (two) times daily as needed for hemorrhoids. 08/20/21   Tomi Bamberger, PA-C  midodrine (PROAMATINE) 10 MG tablet Take 1 tablet (10 mg total) by mouth 3 (three) times daily with meals. 05/21/23   Joseph Art,  DO  Multiple Vitamins-Minerals (MULTIVITAMIN WITH MINERALS) tablet Take 1 tablet by mouth daily with breakfast.    [provider]  omeprazole (PRILOSEC) 20 MG capsule Take  20 mg by mouth daily before breakfast.    [provider]  sodium chloride (OCEAN) 0.65 % SOLN nasal spray Place 1 spray into both nostrils as needed for congestion.    [provider]  thiamine (VITAMIN B-1) 100 MG tablet Take 100 mg by mouth daily.    [provider]  TYLENOL 500 MG tablet Take 500-1,000 mg by mouth every 6 (six) hours as needed for headache or mild pain.    [provider]    Family History    Family History  Problem Relation Age of Onset   Ovarian cancer Mother    Cancer - Colon Father    Prostate cancer Father    Brain cancer Sister    Sickle cell anemia Brother    He indicated that his mother is deceased. He indicated that his father is deceased. He indicated that his sister is deceased. He indicated that his brother is deceased.  Social History    Social History   Socioeconomic History   Marital status: Legally Separated    Spouse name: Not on file   Number of children: Not on file   Years of education: Not on file   Highest education level: Not on file  Occupational History   Not on file  Tobacco Use   Smoking status: Never   Smokeless tobacco: Never  Vaping Use   Vaping status: Never Used  Substance and Sexual Activity   Alcohol use: Yes    Alcohol/week: 24.0 standard drinks of alcohol    Types: 24 Cans of beer per week    Comment: "a little bit...no liquor"   Drug use: No   Sexual activity: Not Currently  Other Topics Concern   Not on file  Social History Narrative   As of September, 2016 the patient works for a [employer]. 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 It is not particularly exertional work but does require some physical strength.   Patient has been separated from his wife since 79.   Social Determinants of Health   Financial Resource Strain: Not on file  Food Insecurity: No Food Insecurity (05/17/2023)   Hunger Vital Sign     Worried About Running Out of Food in the Last Year: Never true    Ran Out of Food in the Last Year: Never true  Transportation Needs: No Transportation Needs (05/17/2023)   PRAPARE - Administrator, Civil Service (Medical): No    Lack of Transportation (Non-Medical): No  Physical Activity: Not on file  Stress: Not on file  Social Connections: Unknown (03/01/2022)   [facility]   Social Network    Social Network: Not on file  Intimate Partner Violence: Not At Risk (05/17/2023)   Humiliation, Afraid, Rape, and Kick questionnaire    Fear of Current or Ex-Partner: No    Emotionally Abused: No    Physically Abused: No    Sexually Abused: No     Review of Systems    General:  No chills, fever, night sweats or weight changes.  Cardiovascular:  No chest pain, dyspnea on exertion, edema, orthopnea, palpitations, paroxysmal nocturnal dyspnea. Dermatological: No rash, lesions/masses Respiratory: No cough, dyspnea Urologic: No hematuria, dysuria Abdominal:   No nausea, vomiting,  diarrhea, bright red blood per rectum, melena, or hematemesis Neurologic:  No visual changes, wkns, changes in mental status. All other systems reviewed and are otherwise negative except as noted above.  Physical Exam    VS:  There were no vitals taken for this visit. , BMI There is no height or weight on file to calculate BMI. GEN: Well nourished, well developed, in no acute distress. HEENT: normal. Neck: Supple, no JVD, carotid bruits, or masses. Cardiac: RRR, no murmurs, rubs, or gallops. No clubbing, cyanosis, edema.  Radials/DP/PT 2+ and equal bilaterally.  Respiratory:  Respirations regular and unlabored, clear to auscultation bilaterally. GI: Soft, nontender, nondistended, BS + x 4. MS: no deformity or atrophy. Skin: warm and dry, no rash. Neuro:  Strength and sensation are intact. Psych: Normal affect.  Accessory Clinical Findings    Recent Labs: 05/07/2023:  TSH 1.865 05/15/2023: Magnesium 1.7 05/16/2023: ALT 40 05/17/2023: Hemoglobin 12.3; Platelets 269 05/18/2023: BUN 10; Creatinine, Ser 0.97; Potassium 3.7; Sodium 138   Recent Lipid Panel    Component Value Date/Time   CHOL 118 05/08/2023 0518   TRIG 69 05/08/2023 0518   HDL 37 (L) 05/08/2023 0518   CHOLHDL 3.2 05/08/2023 0518   VLDL 14 05/08/2023 0518   LDLCALC 67 05/08/2023 0518    No BP recorded.  {Refresh Note OR Click here to enter BP  :1}***    ECG personally reviewed by me today- ***    Echocardiogram 05/16/2023   IMPRESSIONS     1. Left ventricular ejection fraction, by estimation, is >75%. The left  ventricle has hyperdynamic function. The left ventricle has no regional  wall motion abnormalities. There is mild left ventricular hypertrophy.  Left ventricular diastolic parameters  were normal.   2. Right ventricular systolic function is normal. The right ventricular  size is normal.   3. The mitral valve is normal in structure. Trivial mitral valve  regurgitation.   4. The aortic valve is tricuspid. Aortic valve regurgitation is not  visualized.   5. The inferior vena cava is normal in size with greater than 50%  respiratory variability, suggesting right atrial pressure of 3 mmHg.   Comparison(s): The left ventricular function is unchanged.   FINDINGS   Left Ventricle: Left ventricular ejection fraction, by estimation, is  >75%. The left ventricle has hyperdynamic function. The left ventricle has  no regional wall motion abnormalities. The left ventricular internal  cavity size was small. There is mild  left ventricular hypertrophy. Left ventricular diastolic parameters were  normal.   Right Ventricle: The right ventricular size is normal. Right vetricular  wall thickness was not assessed. Right ventricular systolic function is  normal.   Left Atrium: Left atrial size was normal in size.   Right Atrium: Right atrial size was normal in size.    Pericardium: There is no evidence of pericardial effusion.   Mitral Valve: The mitral valve is normal in structure. Trivial mitral  valve regurgitation.   Tricuspid Valve: The tricuspid valve is normal in structure. Tricuspid  valve regurgitation is mild.   Aortic Valve: The aortic valve is tricuspid. Aortic valve regurgitation is  not visualized. Aortic valve peak gradient measures 5.5 mmHg.   Pulmonic Valve: The pulmonic valve was normal in structure. Pulmonic valve  regurgitation is not visualized.   Aorta: The aortic root and ascending aorta are structurally normal, with  no evidence of dilitation.   Venous: The inferior vena cava is normal in size with greater than 50%  respiratory variability,  suggesting right atrial pressure of 3 mmHg.   IAS/Shunts: No atrial level shunt detected by color flow Doppler.       Assessment & Plan   1.  ***   Thomasene Ripple. Mariyah Upshaw NP-C     05/27/2023, 4:44 PM [facility] [address] Office [phone] Fax [phone]    I spent***minutes examining this patient, reviewing medications, and using patient centered shared decision making involving her cardiac care.   I spent greater than 20 minutes reviewing her past medical history,  medications, and prior cardiac tests.

## 2023-05-29 ENCOUNTER — Ambulatory Visit: Payer: No Typology Code available for payment source | Attending: General Practice | Admitting: General Practice

## 2023-07-14 ENCOUNTER — Ambulatory Visit (HOSPITAL_COMMUNITY): Payer: No Typology Code available for payment source

## 2023-09-15 ENCOUNTER — Ambulatory Visit: Payer: Self-pay

## 2023-09-15 NOTE — Telephone Encounter (Signed)
 Chief Complaint: passed out yesterday x 2 minutes Symptoms: cough, runny nose since Friday Frequency: yesterday passed out Pertinent Negatives: Patient denies other symptoms Disposition: [] ED /[x] Urgent Care (no appt availability in office) / [] Appointment(In office/virtual)/ []  [facility]/ [] Home Care/ [] Refused Recommended Disposition /[] [facility] [facility]/ []  Follow-up with PCP Additional Notes: Patient says yesterday he passed out x 2 minutes walking from the bathroom, injury to the left hip. Asked does he have a PCP, he says he usually go to the [place], but they told him to stay home sine he has a cough. Advised since not established to go to the UC, he agreed and asked to be scheduled at [facility] on [address], scheduled for tomorrow morning.   Reason for Disposition  [1] All other patients AND [2] now alert and feels fine  (Exception: SIMPLE FAINT due to stress, pain, prolonged standing, or suddenly standing)  Answer Assessment - Initial Assessment Questions 1. ONSET: "How long were you unconscious?" (minutes) "When did it happen?"     2 minutes yesterday 2. CONTENT: "What happened during period of unconsciousness?" (e.g., seizure activity)      Just lying on the floor 3. MENTAL STATUS: "Alert and oriented now?" (oriented x 3 = name, month, location)      When woke up was alert to self, surrounding, time 4. TRIGGER: "What do you think caused the fainting?" "What were you doing just before you fainted?"  (e.g., exercise, sudden standing up, prolonged standing)     Walking from the bathroom then woke up on floor. Unsure what caused it  5. RECURRENT SYMPTOM: "Have you ever passed out before?" If Yes, ask: "When was the last time?" and "What happened that time?"      Last summer this happened 6. INJURY: "Did you sustain any injury during the fall?"      Knot on the left side of hip 7. CARDIAC SYMPTOMS: "Have you had any of the following symptoms: chest pain, difficulty  breathing, palpitations?"     No 8. NEUROLOGIC SYMPTOMS: "Have you had any of the following symptoms: headache, numbness, vertigo, weakness?"     No 9. GI SYMPTOMS: "Have you had any of the following symptoms: abdomen pain, vomiting, diarrhea, blood in stools?"     Diarrhea Saturday-Sunday evening 10. OTHER SYMPTOMS: "Do you have any other symptoms?"       Deep cough and runny nose since last Friday, weakness  Protocols used: Fainting-A-AH

## 2023-09-15 NOTE — Telephone Encounter (Signed)
 Copied from CRM [id number]. Topic: Clinical - Red Word Triage >> Sep 15, 2023 11:32 AM Fuller Mandril wrote: Red Word that prompted transfer to Nurse Triage: Larey Seat and hurt left rear, blackouts, also coughing phlegm

## 2023-09-16 ENCOUNTER — Ambulatory Visit (HOSPITAL_COMMUNITY): Payer: Self-pay

## 2023-10-07 ENCOUNTER — Other Ambulatory Visit: Payer: Self-pay | Admitting: Physician Assistant

## 2023-10-07 NOTE — Telephone Encounter (Signed)
 Copied from CRM [id number]. Topic: Clinical - Medication Refill >> Oct 07, 2023  3:54 PM Higinio Roger wrote: Most Recent Primary Care Visit:   Medication: omeprazole (PRILOSEC) 20 MG capsule   Has the patient contacted their pharmacy? Yes (Agent: If no, request that the patient contact the pharmacy for the refill. If patient does not wish to contact the pharmacy document the reason why and proceed with request.) (Agent: If yes, when and what did the pharmacy advise?)  Is this the correct pharmacy for this prescription? Yes If no, delete pharmacy and type the correct one.  This is the patient's preferred pharmacy:    [id number] [place], [place] - [address] [place] [address] Phone: [phone] Fax: [phone]   Has the prescription been filled recently? Yes  Is the patient out of the medication? Yes  Has the patient been seen for an appointment in the last year OR does the patient have an upcoming appointment? Yes  Can we respond through MyChart? No  Agent: Please be advised that Rx refills may take up to 3 business days. We ask that you follow-up with your pharmacy.

## 2023-10-08 ENCOUNTER — Ambulatory Visit: Admitting: Physician Assistant

## 2023-10-08 NOTE — Telephone Encounter (Signed)
 Patient is not established with any [place] PCP. He has a new patient appointment scheduled this afternoon to establish. Closing out this encounter due to patient not being established.

## 2023-10-20 ENCOUNTER — Ambulatory Visit: Payer: No Typology Code available for payment source | Attending: Internal Medicine | Admitting: Internal Medicine
